# Patient Record
Sex: Female | Born: 1950 | Smoking: Former smoker
Health system: Southern US, Community
[De-identification: ages and names within clinical notes are randomized; demographics above are authoritative.]

## PROBLEM LIST (undated history)

## (undated) DIAGNOSIS — C44609 Unspecified malignant neoplasm of skin of left upper limb, including shoulder: Secondary | ICD-10-CM

## (undated) DIAGNOSIS — E785 Hyperlipidemia, unspecified: Secondary | ICD-10-CM

## (undated) DIAGNOSIS — C801 Malignant (primary) neoplasm, unspecified: Secondary | ICD-10-CM

## (undated) DIAGNOSIS — E119 Type 2 diabetes mellitus without complications: Secondary | ICD-10-CM

## (undated) DIAGNOSIS — I499 Cardiac arrhythmia, unspecified: Secondary | ICD-10-CM

## (undated) DIAGNOSIS — M7632 Iliotibial band syndrome, left leg: Secondary | ICD-10-CM

## (undated) HISTORY — DX: Iliotibial band syndrome, left leg: M76.32

## (undated) HISTORY — DX: Hyperlipidemia, unspecified: E78.5

## (undated) HISTORY — PX: TUBAL LIGATION: SHX77

## (undated) HISTORY — PX: CHOLECYSTECTOMY: SHX55

---
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
## 2006-06-18 ENCOUNTER — Ambulatory Visit: Payer: Self-pay | Admitting: Internal Medicine

## 2006-11-20 ENCOUNTER — Emergency Department: Payer: Self-pay | Admitting: Unknown Physician Specialty

## 2014-02-18 DIAGNOSIS — E782 Mixed hyperlipidemia: Secondary | ICD-10-CM

## 2014-02-18 HISTORY — DX: Mixed hyperlipidemia: E78.2

## 2015-09-09 DIAGNOSIS — M7632 Iliotibial band syndrome, left leg: Secondary | ICD-10-CM | POA: Insufficient documentation

## 2015-09-09 HISTORY — DX: Iliotibial band syndrome, left leg: M76.32

## 2015-12-19 ENCOUNTER — Encounter: Payer: Self-pay | Admitting: Cardiology

## 2017-10-29 DIAGNOSIS — E1169 Type 2 diabetes mellitus with other specified complication: Secondary | ICD-10-CM

## 2017-10-29 DIAGNOSIS — E785 Hyperlipidemia, unspecified: Secondary | ICD-10-CM

## 2017-10-29 HISTORY — DX: Type 2 diabetes mellitus with other specified complication: E11.69

## 2017-10-29 HISTORY — DX: Hyperlipidemia, unspecified: E78.5

## 2017-11-11 DIAGNOSIS — R079 Chest pain, unspecified: Secondary | ICD-10-CM | POA: Diagnosis not present

## 2017-11-12 DIAGNOSIS — R079 Chest pain, unspecified: Secondary | ICD-10-CM | POA: Diagnosis not present

## 2017-11-13 DIAGNOSIS — R079 Chest pain, unspecified: Secondary | ICD-10-CM | POA: Diagnosis not present

## 2017-11-20 ENCOUNTER — Ambulatory Visit (INDEPENDENT_AMBULATORY_CARE_PROVIDER_SITE_OTHER): Payer: Medicare Other | Admitting: Cardiology

## 2017-11-20 ENCOUNTER — Encounter: Payer: Self-pay | Admitting: Cardiology

## 2017-11-20 VITALS — BP 170/78 | HR 108 | Ht 66.0 in | Wt 158.0 lb

## 2017-11-20 DIAGNOSIS — Z87891 Personal history of nicotine dependence: Secondary | ICD-10-CM

## 2017-11-20 DIAGNOSIS — E1159 Type 2 diabetes mellitus with other circulatory complications: Secondary | ICD-10-CM | POA: Insufficient documentation

## 2017-11-20 DIAGNOSIS — E782 Mixed hyperlipidemia: Secondary | ICD-10-CM

## 2017-11-20 DIAGNOSIS — I152 Hypertension secondary to endocrine disorders: Secondary | ICD-10-CM

## 2017-11-20 DIAGNOSIS — I209 Angina pectoris, unspecified: Secondary | ICD-10-CM

## 2017-11-20 DIAGNOSIS — E785 Hyperlipidemia, unspecified: Secondary | ICD-10-CM

## 2017-11-20 DIAGNOSIS — I10 Essential (primary) hypertension: Secondary | ICD-10-CM

## 2017-11-20 HISTORY — DX: Hypertension secondary to endocrine disorders: I15.2

## 2017-11-20 HISTORY — DX: Personal history of nicotine dependence: Z87.891

## 2017-11-20 HISTORY — DX: Angina pectoris, unspecified: I20.9

## 2017-11-20 HISTORY — DX: Essential (primary) hypertension: I10

## 2017-11-20 HISTORY — DX: Hyperlipidemia, unspecified: E78.5

## 2017-11-20 HISTORY — DX: Type 2 diabetes mellitus with other circulatory complications: E11.59

## 2017-11-20 MED ORDER — NITROGLYCERIN 0.4 MG SL SUBL: 0.4000 mg | SUBLINGUAL_TABLET | SUBLINGUAL | 11 refills | Status: DC | PRN

## 2017-11-20 NOTE — Patient Instructions (Addendum)
Medication Instructions: Your physician has recommended you make the following change in your medication:  START Nitroglycerin 0.4 mg sublingual (under your tongue) as needed for chest pain. If experiencing chest pain, stop what you are doing and sit down. Take 1 nitroglycerin and wait 5 minutes. If chest pain continues, take another nitroglycerin and wait 5 minutes. If chest pain does not subside, take 1 more nitroglycerin and dial 911. You make take a total of 3 nitroglycerin in a 15 minute time frame  Labwork: None  Testing/Procedures: A chest x-ray takes a picture of the organs and structures inside the chest, including the heart, lungs, and blood vessels. This test can show several things, including, whether the heart is enlarges; whether fluid is building up in the lungs; and whether pacemaker / defibrillator leads are still in place.     [place] AT [facility] [place] [address] Dept: [phone] Loc: [place]  11/20/2017  You are scheduled for a Cardiac Catheterization on Friday, July 26 with Dr. Larae Grooms.  1. Please arrive at the [facility] (Main Entrance A) at [facility]: [address] [place], [place] [address] at 5:30 AM (This time is two hours before your procedure to ensure your preparation). Free valet parking service is available.   Special note: Every effort is made to have your procedure done on time. Please understand that emergencies sometimes delay scheduled procedures.  2. Diet: Do not eat solid foods after midnight.  The patient may have clear liquids until 5am upon the day of the procedure.  3. Labs:You do not need labs.  4. Medication instructions in preparation for your procedure:  Do not take your lisinopril-hctz the morning of the heart cath.  On the morning of your procedure, take your Aspirin and any morning medicines NOT listed  above.  You may use sips of water.  5. Plan for one night stay--bring personal belongings. 6. Bring a current list of your medications and current insurance cards. 7. You MUST have a responsible person to drive you home. 8. Someone MUST be with you the first 24 hours after you arrive home or your discharge will be delayed. 9. Please wear clothes that are easy to get on and off and wear slip-on shoes.  Thank you for allowing [place] to care for you!   -- [facility]   Follow-Up: Your physician recommends that you schedule a follow-up appointment in: 1 month  Any Other Special Instructions Will Be Listed Below (If Applicable).     If you need a refill on your cardiac medications before your next appointment, please call your pharmacy.   [place], RN, BSN

## 2017-11-20 NOTE — Progress Notes (Signed)
Cardiology Office Note:    Date:  11/20/2017   ID:  Courtney Gardner, DOB 1950-12-11, MRN [id number]  PCP:  Marguerita Merles, MD  Cardiologist:  Jenean Lindau, MD   Referring MD: No ref. provider found    ASSESSMENT:    1. Angina pectoris ([place])   2. Essential hypertension   3. Ex-smoker    PLAN:    In order of problems listed above:  1. Primary prevention stressed with the patient.  Importance of compliance with diet and medications stressed.  Her blood pressure is stable.  She has an element of whitecoat hypertension.  She mentions to me that her blood pressures a are fine at home.  She mentioned to me the numbers and they look fine. 2. Her symptoms are very concerning and she has multiple risk factors for coronary artery disease.I discussed coronary angiography and left heart catheterization with the patient at extensive length. Procedure, benefits and potential risks were explained. Patient had multiple questions which were answered to the patient's satisfaction. Patient agreed and consented for the procedure. Further recommendations will be made based on the findings of the coronary angiography. In the interim. The patient has any significant symptoms he knows to go to the nearest emergency room. 3. Sublingual nitroglycerin prescription was sent, its protocol and 911 protocol explained and the patient vocalized understanding questions were answered to the patient's satisfaction 4. [place] records were reviewed and questions were answered to her satisfaction.  Medication Adjustments/Labs and Tests Ordered: Current medicines are reviewed at length with the patient today.  Concerns regarding medicines are outlined above.  No orders of the defined types were placed in this encounter.  No orders of the defined types were placed in this encounter.    History of Present Illness:    Courtney Gardner is a 67 y.o. female who is being seen today for the evaluation of angina  pectoris at the request of her primary care provider.  Patient is a pleasant 66 year old female.  She has past medical history of essential hypertension and dyslipidemia.  She went on to  hospital because of chest tightness.  She was evaluated for this and stress test did not reveal any evidence of ischemia but there was some fixed defects.  Patient mentions to me that since she has come home she is been experiencing chest tightness at times not related to exertion.  No orthopnea or PND.  No radiation to any part of the body.  She is extremely concerned about this and is here for an evaluation.  Her friend accompanies her.  At the time of my evaluation, the patient is alert awake oriented and in no distress.  Past Medical History:  Diagnosis Date  . Hyperlipidemia 11/20/2017  . Iliotibial band syndrome of left side 09/09/2015    Past Surgical History:  Procedure Laterality Date  . CHOLECYSTECTOMY    . TUBAL LIGATION      Current Medications: Current Meds  Medication Sig  . aspirin EC 81 MG tablet Take 81 mg by mouth daily.  . cyclobenzaprine (FLEXERIL) 5 MG tablet Take 1 tablet by mouth as needed.  Marland Kitchen levothyroxine (SYNTHROID, LEVOTHROID) 50 MCG tablet Take 50 mcg by mouth daily.  Marland Kitchen lisinopril-hydrochlorothiazide (PRINZIDE,ZESTORETIC) 20-25 MG tablet Take 1 tablet by mouth daily.  Marland Kitchen lovastatin (MEVACOR) 20 MG tablet Take 20 mg by mouth at bedtime.  Marland Kitchen omeprazole (PRILOSEC) 20 MG capsule Take 1 capsule by mouth daily.     Allergies:  Metformin and related   Social History   Socioeconomic History  . Marital status: Unknown    Spouse name: Not on file  . Number of children: Not on file  . Years of education: Not on file  . Highest education level: Not on file  Occupational History  . Not on file  Social Needs  . Financial resource strain: Not on file  . Food insecurity:    Worry: Not on file    Inability: Not on file  . Transportation needs:    Medical: Not on file     Non-medical: Not on file  Tobacco Use  . Smoking status: Former Research scientist (life sciences)  . Smokeless tobacco: Never Used  Substance and Sexual Activity  . Alcohol use: Not on file  . Drug use: Not on file  . Sexual activity: Not on file  Lifestyle  . Physical activity:    Days per week: Not on file    Minutes per session: Not on file  . Stress: Not on file  Relationships  . Social connections:    Talks on phone: Not on file    Gets together: Not on file    Attends religious service: Not on file    Active member of club or organization: Not on file    Attends meetings of clubs or organizations: Not on file    Relationship status: Not on file  Other Topics Concern  . Not on file  Social History Narrative  . Not on file     Family History: The patient's family history includes Heart attack in her maternal grandfather, maternal grandmother, and maternal uncle; Hypertension in her sister; Leukemia in her mother; Lung cancer in her father.  ROS:   Please see the history of present illness.    All other systems reviewed and are negative.  EKGs/Labs/Other Studies Reviewed:    The following studies were reviewed today: I discussed my findings with the patient.  EKG reveals sinus rhythm and T wave inversions in lateral leads.   Recent Labs: No results found for requested labs within last 8760 hours.  Recent Lipid Panel No results found for: CHOL, TRIG, HDL, CHOLHDL, VLDL, LDLCALC, LDLDIRECT  Physical Exam:    VS:  BP (!) 170/78 (BP Location: Right Arm, Patient Position: Sitting, Cuff Size: Normal)   Pulse (!) 108   Ht 5\' 6"  (1.676 m)   Wt 158 lb (71.7 kg)   SpO2 97%   BMI 25.50 kg/m     Wt Readings from Last 3 Encounters:  11/20/17 158 lb (71.7 kg)     GEN: Patient is in no acute distress HEENT: Normal NECK: No JVD; No carotid bruits LYMPHATICS: No lymphadenopathy CARDIAC: S1 S2 regular, 2/6 systolic murmur at the apex. RESPIRATORY:  Clear to auscultation without rales, wheezing  or rhonchi  ABDOMEN: Soft, non-tender, non-distended MUSCULOSKELETAL:  No edema; No deformity  SKIN: Warm and dry NEUROLOGIC:  Alert and oriented x 3 PSYCHIATRIC:  Normal affect    Signed, Jenean Lindau, MD  11/20/2017 1:45 PM    [facility]

## 2017-11-20 NOTE — H&P (View-Only) (Signed)
Cardiology Office Note:    Date:  11/20/2017   ID:  Courtney Gardner, DOB Aug 17, 1950, MRN [id number]  PCP:  Marguerita Merles, MD  Cardiologist:  Jenean Lindau, MD   Referring MD: No ref. provider found    ASSESSMENT:    1. Angina pectoris ([place])   2. Essential hypertension   3. Ex-smoker    PLAN:    In order of problems listed above:  1. Primary prevention stressed with the patient.  Importance of compliance with diet and medications stressed.  Her blood pressure is stable.  She has an element of whitecoat hypertension.  She mentions to me that her blood pressures a are fine at home.  She mentioned to me the numbers and they look fine. 2. Her symptoms are very concerning and she has multiple risk factors for coronary artery disease.I discussed coronary angiography and left heart catheterization with the patient at extensive length. Procedure, benefits and potential risks were explained. Patient had multiple questions which were answered to the patient's satisfaction. Patient agreed and consented for the procedure. Further recommendations will be made based on the findings of the coronary angiography. In the interim. The patient has any significant symptoms he knows to go to the nearest emergency room. 3. Sublingual nitroglycerin prescription was sent, its protocol and 911 protocol explained and the patient vocalized understanding questions were answered to the patient's satisfaction 4. [place] records were reviewed and questions were answered to her satisfaction.  Medication Adjustments/Labs and Tests Ordered: Current medicines are reviewed at length with the patient today.  Concerns regarding medicines are outlined above.  No orders of the defined types were placed in this encounter.  No orders of the defined types were placed in this encounter.    History of Present Illness:    Courtney Gardner is a 67 y.o. female who is being seen today for the evaluation of angina  pectoris at the request of her primary care provider.  Patient is a pleasant 67 year old female.  She has past medical history of essential hypertension and dyslipidemia.  She went on to [place] hospital because of chest tightness.  She was evaluated for this and stress test did not reveal any evidence of ischemia but there was some fixed defects.  Patient mentions to me that since she has come home she is been experiencing chest tightness at times not related to exertion.  No orthopnea or PND.  No radiation to any part of the body.  She is extremely concerned about this and is here for an evaluation.  Her friend accompanies her.  At the time of my evaluation, the patient is alert awake oriented and in no distress.  Past Medical History:  Diagnosis Date  . Hyperlipidemia 11/20/2017  . Iliotibial band syndrome of left side 09/09/2015    Past Surgical History:  Procedure Laterality Date  . CHOLECYSTECTOMY    . TUBAL LIGATION      Current Medications: Current Meds  Medication Sig  . aspirin EC 81 MG tablet Take 81 mg by mouth daily.  . cyclobenzaprine (FLEXERIL) 5 MG tablet Take 1 tablet by mouth as needed.  Marland Kitchen levothyroxine (SYNTHROID, LEVOTHROID) 50 MCG tablet Take 50 mcg by mouth daily.  Marland Kitchen lisinopril-hydrochlorothiazide (PRINZIDE,ZESTORETIC) 20-25 MG tablet Take 1 tablet by mouth daily.  Marland Kitchen lovastatin (MEVACOR) 20 MG tablet Take 20 mg by mouth at bedtime.  Marland Kitchen omeprazole (PRILOSEC) 20 MG capsule Take 1 capsule by mouth daily.     Allergies:  Metformin and related   Social History   Socioeconomic History  . Marital status: Unknown    Spouse name: Not on file  . Number of children: Not on file  . Years of education: Not on file  . Highest education level: Not on file  Occupational History  . Not on file  Social Needs  . Financial resource strain: Not on file  . Food insecurity:    Worry: Not on file    Inability: Not on file  . Transportation needs:    Medical: Not on file     Non-medical: Not on file  Tobacco Use  . Smoking status: Former Research scientist (life sciences)  . Smokeless tobacco: Never Used  Substance and Sexual Activity  . Alcohol use: Not on file  . Drug use: Not on file  . Sexual activity: Not on file  Lifestyle  . Physical activity:    Days per week: Not on file    Minutes per session: Not on file  . Stress: Not on file  Relationships  . Social connections:    Talks on phone: Not on file    Gets together: Not on file    Attends religious service: Not on file    Active member of club or organization: Not on file    Attends meetings of clubs or organizations: Not on file    Relationship status: Not on file  Other Topics Concern  . Not on file  Social History Narrative  . Not on file     Family History: The patient's family history includes Heart attack in her maternal grandfather, maternal grandmother, and maternal uncle; Hypertension in her sister; Leukemia in her mother; Lung cancer in her father.  ROS:   Please see the history of present illness.    All other systems reviewed and are negative.  EKGs/Labs/Other Studies Reviewed:    The following studies were reviewed today: I discussed my findings with the patient.  EKG reveals sinus rhythm and T wave inversions in lateral leads.   Recent Labs: No results found for requested labs within last 8760 hours.  Recent Lipid Panel No results found for: CHOL, TRIG, HDL, CHOLHDL, VLDL, LDLCALC, LDLDIRECT  Physical Exam:    VS:  BP (!) 170/78 (BP Location: Right Arm, Patient Position: Sitting, Cuff Size: Normal)   Pulse (!) 108   Ht 5\' 6"  (1.676 m)   Wt 158 lb (71.7 kg)   SpO2 97%   BMI 25.50 kg/m     Wt Readings from Last 3 Encounters:  11/20/17 158 lb (71.7 kg)     GEN: Patient is in no acute distress HEENT: Normal NECK: No JVD; No carotid bruits LYMPHATICS: No lymphadenopathy CARDIAC: S1 S2 regular, 2/6 systolic murmur at the apex. RESPIRATORY:  Clear to auscultation without rales, wheezing  or rhonchi  ABDOMEN: Soft, non-tender, non-distended MUSCULOSKELETAL:  No edema; No deformity  SKIN: Warm and dry NEUROLOGIC:  Alert and oriented x 3 PSYCHIATRIC:  Normal affect    Signed, Jenean Lindau, MD  11/20/2017 1:45 PM    [facility]

## 2017-11-22 ENCOUNTER — Telehealth: Payer: Self-pay | Admitting: *Deleted

## 2017-11-22 NOTE — Telephone Encounter (Signed)
Pt contacted pre-catheterization scheduled at [facility] for: Friday November 23, 2017 7:30 AM Verified arrival time and place: [place] Entrance A at: 5:30 AM  No solid food after midnight prior to cath, clear liquids until 5 AM day of procedure. Verified allergies in Epic Verified no diabetes medications.  Hold: Lisinopril-HCTZ AM of procedure  Except hold medications AM meds can be  taken pre-cath with sip of water including: ASA 81 mg  Confirmed patient has responsible person to drive home post procedure and for 24 hours after you arrive home: yes

## 2017-11-23 ENCOUNTER — Ambulatory Visit (HOSPITAL_COMMUNITY)
Admission: RE | Admit: 2017-11-23 | Discharge: 2017-11-23 | Disposition: A | Discharge status: Home / Self Care | Payer: Medicare Other | Source: Ambulatory Visit | Attending: Interventional Cardiology | Admitting: Interventional Cardiology

## 2017-11-23 ENCOUNTER — Other Ambulatory Visit: Payer: Self-pay

## 2017-11-23 ENCOUNTER — Encounter (HOSPITAL_COMMUNITY)
Admission: RE | Disposition: A | Discharge status: Home / Self Care | Payer: Self-pay | Source: Ambulatory Visit | Attending: Interventional Cardiology

## 2017-11-23 DIAGNOSIS — Z888 Allergy status to other drugs, medicaments and biological substances status: Secondary | ICD-10-CM | POA: Insufficient documentation

## 2017-11-23 DIAGNOSIS — Z9889 Other specified postprocedural states: Secondary | ICD-10-CM | POA: Insufficient documentation

## 2017-11-23 DIAGNOSIS — E782 Mixed hyperlipidemia: Secondary | ICD-10-CM

## 2017-11-23 DIAGNOSIS — Z8249 Family history of ischemic heart disease and other diseases of the circulatory system: Secondary | ICD-10-CM | POA: Insufficient documentation

## 2017-11-23 DIAGNOSIS — I209 Angina pectoris, unspecified: Secondary | ICD-10-CM

## 2017-11-23 DIAGNOSIS — E785 Hyperlipidemia, unspecified: Secondary | ICD-10-CM | POA: Insufficient documentation

## 2017-11-23 DIAGNOSIS — Z9049 Acquired absence of other specified parts of digestive tract: Secondary | ICD-10-CM | POA: Diagnosis not present

## 2017-11-23 DIAGNOSIS — Z7982 Long term (current) use of aspirin: Secondary | ICD-10-CM | POA: Insufficient documentation

## 2017-11-23 DIAGNOSIS — Z79899 Other long term (current) drug therapy: Secondary | ICD-10-CM | POA: Diagnosis not present

## 2017-11-23 DIAGNOSIS — I25119 Atherosclerotic heart disease of native coronary artery with unspecified angina pectoris: Secondary | ICD-10-CM | POA: Diagnosis not present

## 2017-11-23 DIAGNOSIS — Z9851 Tubal ligation status: Secondary | ICD-10-CM | POA: Diagnosis not present

## 2017-11-23 DIAGNOSIS — Z87891 Personal history of nicotine dependence: Secondary | ICD-10-CM | POA: Diagnosis not present

## 2017-11-23 DIAGNOSIS — Z79890 Hormone replacement therapy (postmenopausal): Secondary | ICD-10-CM | POA: Insufficient documentation

## 2017-11-23 DIAGNOSIS — I10 Essential (primary) hypertension: Secondary | ICD-10-CM | POA: Diagnosis not present

## 2017-11-23 HISTORY — PX: INTRAVASCULAR PRESSURE WIRE/FFR STUDY: CATH118243

## 2017-11-23 HISTORY — PX: LEFT HEART CATH AND CORONARY ANGIOGRAPHY: CATH118249

## 2017-11-23 LAB — BASIC METABOLIC PANEL
ANION GAP: 17 — AB (ref 5–15)
BUN: 13 mg/dL (ref 8–23)
CHLORIDE: 79 mmol/L — AB (ref 98–111)
CO2: 22 mmol/L (ref 22–32)
Calcium: 10.4 mg/dL — ABNORMAL HIGH (ref 8.9–10.3)
Creatinine, Ser: 1.2 mg/dL — ABNORMAL HIGH (ref 0.44–1.00)
GFR calc Af Amer: 53 mL/min — ABNORMAL LOW (ref >60)
GFR, EST NON AFRICAN AMERICAN: 45 mL/min — AB (ref >60)
GLUCOSE: 235 mg/dL — AB (ref 70–99)
POTASSIUM: 4.1 mmol/L (ref 3.5–5.1)
Sodium: 118 mmol/L — CL (ref 135–145)

## 2017-11-23 LAB — CBC
HCT: 41.3 % (ref 36.0–46.0)
HEMOGLOBIN: 15.2 g/dL — AB (ref 12.0–15.0)
MCH: 30.5 pg (ref 26.0–34.0)
MCHC: 36.8 g/dL — ABNORMAL HIGH (ref 30.0–36.0)
MCV: 82.9 fL (ref 78.0–100.0)
Platelets: 317 10*3/uL (ref 150–400)
RBC: 4.98 MIL/uL (ref 3.87–5.11)
RDW: 11.2 % — ABNORMAL LOW (ref 11.5–15.5)
WBC: 12.1 10*3/uL — AB (ref 4.0–10.5)

## 2017-11-23 LAB — POCT ACTIVATED CLOTTING TIME: Activated Clotting Time: 296 seconds

## 2017-11-23 SURGERY — LEFT HEART CATH AND CORONARY ANGIOGRAPHY
Anesthesia: LOCAL

## 2017-11-23 MED ORDER — VERAPAMIL HCL 2.5 MG/ML IV SOLN
INTRAVENOUS | Status: DC | PRN
  Administered 2017-11-23: 10 mL via INTRA_ARTERIAL

## 2017-11-23 MED ORDER — SODIUM CHLORIDE 0.9 % WEIGHT BASED INFUSION: 1.0000 mL/kg/h | INTRAVENOUS | Status: DC

## 2017-11-23 MED ORDER — MIDAZOLAM HCL 2 MG/2ML IJ SOLN
INTRAMUSCULAR | Status: DC | PRN
  Administered 2017-11-23: 1 mg via INTRAVENOUS

## 2017-11-23 MED ORDER — NITROGLYCERIN 1 MG/10 ML FOR IR/CATH LAB
INTRA_ARTERIAL | Status: AC
  Filled 2017-11-23: qty 10

## 2017-11-23 MED ORDER — FENTANYL CITRATE (PF) 100 MCG/2ML IJ SOLN
INTRAMUSCULAR | Status: DC | PRN
  Administered 2017-11-23: 25 ug via INTRAVENOUS

## 2017-11-23 MED ORDER — ADENOSINE 12 MG/4ML IV SOLN
INTRAVENOUS | Status: AC
  Filled 2017-11-23: qty 16

## 2017-11-23 MED ORDER — HEPARIN SODIUM (PORCINE) 1000 UNIT/ML IJ SOLN
INTRAMUSCULAR | Status: AC
  Filled 2017-11-23: qty 1

## 2017-11-23 MED ORDER — NITROGLYCERIN 1 MG/10 ML FOR IR/CATH LAB
INTRA_ARTERIAL | Status: DC | PRN
  Administered 2017-11-23: 200 ug via INTRACORONARY
  Administered 2017-11-23: 200 ug via INTRA_ARTERIAL

## 2017-11-23 MED ORDER — IOPAMIDOL (ISOVUE-370) INJECTION 76%
INTRAVENOUS | Status: AC
  Filled 2017-11-23: qty 100

## 2017-11-23 MED ORDER — HEPARIN SODIUM (PORCINE) 1000 UNIT/ML IJ SOLN
INTRAMUSCULAR | Status: DC | PRN
  Administered 2017-11-23: 4000 [IU] via INTRAVENOUS
  Administered 2017-11-23: 3500 [IU] via INTRAVENOUS

## 2017-11-23 MED ORDER — SODIUM CHLORIDE 0.9 % WEIGHT BASED INFUSION
3.0000 mL/kg/h | INTRAVENOUS | Status: DC
  Administered 2017-11-23: 3 mL/kg/h via INTRAVENOUS

## 2017-11-23 MED ORDER — ASPIRIN 81 MG PO CHEW: 81.0000 mg | CHEWABLE_TABLET | ORAL | Status: DC

## 2017-11-23 MED ORDER — FENTANYL CITRATE (PF) 100 MCG/2ML IJ SOLN
INTRAMUSCULAR | Status: AC
  Filled 2017-11-23: qty 2

## 2017-11-23 MED ORDER — SODIUM CHLORIDE 0.9 % IV SOLN: INTRAVENOUS | Status: AC

## 2017-11-23 MED ORDER — SODIUM CHLORIDE 0.9% FLUSH: 3.0000 mL | Freq: Two times a day (BID) | INTRAVENOUS | Status: DC

## 2017-11-23 MED ORDER — SODIUM CHLORIDE 0.9 % IV SOLN: 250.0000 mL | INTRAVENOUS | Status: DC | PRN

## 2017-11-23 MED ORDER — LIDOCAINE HCL (PF) 1 % IJ SOLN
INTRAMUSCULAR | Status: DC | PRN
  Administered 2017-11-23: 2 mL via INTRADERMAL

## 2017-11-23 MED ORDER — HYDRALAZINE HCL 20 MG/ML IJ SOLN
10.0000 mg | Freq: Once | INTRAMUSCULAR | Status: AC
  Administered 2017-11-23: 10 mg via INTRAVENOUS
  Filled 2017-11-23: qty 1

## 2017-11-23 MED ORDER — ADENOSINE (DIAGNOSTIC) 140MCG/KG/MIN
INTRAVENOUS | Status: DC | PRN
  Administered 2017-11-23: 140 ug/kg/min via INTRAVENOUS

## 2017-11-23 MED ORDER — SODIUM CHLORIDE 0.9% FLUSH: 3.0000 mL | INTRAVENOUS | Status: DC | PRN

## 2017-11-23 MED ORDER — VERAPAMIL HCL 2.5 MG/ML IV SOLN
INTRAVENOUS | Status: AC
  Filled 2017-11-23: qty 2

## 2017-11-23 MED ORDER — IOPAMIDOL (ISOVUE-370) INJECTION 76%
INTRAVENOUS | Status: AC
  Filled 2017-11-23: qty 50

## 2017-11-23 MED ORDER — ONDANSETRON HCL 4 MG/2ML IJ SOLN
4.0000 mg | Freq: Once | INTRAMUSCULAR | Status: AC
  Administered 2017-11-23: 4 mg via INTRAVENOUS
  Filled 2017-11-23: qty 2

## 2017-11-23 MED ORDER — HEPARIN (PORCINE) IN NACL 1000-0.9 UT/500ML-% IV SOLN
INTRAVENOUS | Status: DC | PRN
  Administered 2017-11-23: 500 mL

## 2017-11-23 MED ORDER — ACETAMINOPHEN 325 MG PO TABS: 650.0000 mg | ORAL_TABLET | ORAL | Status: DC | PRN

## 2017-11-23 MED ORDER — HEPARIN (PORCINE) IN NACL 1000-0.9 UT/500ML-% IV SOLN
INTRAVENOUS | Status: AC
  Filled 2017-11-23: qty 1000

## 2017-11-23 MED ORDER — SODIUM CHLORIDE 0.9 % IV SOLN
250.0000 mL | INTRAVENOUS | Status: DC | PRN
  Administered 2017-11-23: 250 mL via INTRAVENOUS

## 2017-11-23 MED ORDER — LORAZEPAM 0.5 MG PO TABS
0.5000 mg | ORAL_TABLET | Freq: Once | ORAL | Status: AC
  Administered 2017-11-23: 0.5 mg via ORAL
  Filled 2017-11-23: qty 1

## 2017-11-23 MED ORDER — LIDOCAINE HCL (PF) 1 % IJ SOLN
INTRAMUSCULAR | Status: AC
  Filled 2017-11-23: qty 30

## 2017-11-23 MED ORDER — ONDANSETRON HCL 4 MG/2ML IJ SOLN: 4.0000 mg | Freq: Four times a day (QID) | INTRAMUSCULAR | Status: DC | PRN

## 2017-11-23 MED ORDER — MIDAZOLAM HCL 2 MG/2ML IJ SOLN
INTRAMUSCULAR | Status: AC
  Filled 2017-11-23: qty 2

## 2017-11-23 SURGICAL SUPPLY — 13 items
CATH 5FR JL3.5 JR4 ANG PIG MP (CATHETERS) ×1 IMPLANT
CATH LAUNCHER 5F EBU3.0 (CATHETERS) IMPLANT
CATHETER LAUNCHER 5F EBU3.0 (CATHETERS) ×2
DEVICE RAD COMP TR BAND LRG (VASCULAR PRODUCTS) ×1 IMPLANT
GLIDESHEATH SLEND SS 6F .021 (SHEATH) ×1 IMPLANT
GUIDEWIRE INQWIRE 1.5J.035X260 (WIRE) IMPLANT
GUIDEWIRE PRESSURE COMET II (WIRE) ×1 IMPLANT
INQWIRE 1.5J .035X260CM (WIRE) ×2
KIT HEART LEFT (KITS) ×2 IMPLANT
KIT HEMO VALVE WATCHDOG (MISCELLANEOUS) ×1 IMPLANT
PACK CARDIAC CATHETERIZATION (CUSTOM PROCEDURE TRAY) ×2 IMPLANT
TRANSDUCER W/STOPCOCK (MISCELLANEOUS) ×2 IMPLANT
TUBING CIL FLEX 10 FLL-RA (TUBING) ×2 IMPLANT

## 2017-11-23 NOTE — Discharge Instructions (Signed)
Drink plenty of fluids over next 48 hours and keep right wrist elevated at heart level for 24 hours ° °Radial Site Care °Refer to this sheet in the next few weeks. These instructions provide you with information about caring for yourself after your procedure. Your health care provider may also give you more specific instructions. Your treatment has been planned according to current medical practices, but problems sometimes occur. Call your health care provider if you have any problems or questions after your procedure. °What can I expect after the procedure? °After your procedure, it is typical to have the following: °· Bruising at the radial site that usually fades within 1-2 weeks. °· Blood collecting in the tissue (hematoma) that may be painful to the touch. It should usually decrease in size and tenderness within 1-2 weeks. ° °Follow these instructions at home: °· Take medicines only as directed by your health care provider. °· You may shower 24-48 hours after the procedure or as directed by your health care provider. Remove the bandage (dressing) and gently wash the site with plain soap and water. Pat the area dry with a clean towel. Do not rub the site, because this may cause bleeding. °· Do not take baths, swim, or use a hot tub until your health care provider approves. °· Check your insertion site every day for redness, swelling, or drainage. °· Do not apply powder or lotion to the site. °· Do not flex or bend the affected arm for 24 hours or as directed by your health care provider. °· Do not push or pull heavy objects with the affected arm for 24 hours or as directed by your health care provider. °· Do not lift over 10 lb (4.5 kg) for 5 days after your procedure or as directed by your health care provider. °· Ask your health care provider when it is okay to: °? Return to work or school. °? Resume usual physical activities or sports. °? Resume sexual activity. °· Do not drive home if you are discharged the  same day as the procedure. Have someone else drive you. °· You may drive 24 hours after the procedure unless otherwise instructed by your health care provider. °· Do not operate machinery or power tools for 24 hours after the procedure. °· If your procedure was done as an outpatient procedure, which means that you went home the same day as your procedure, a responsible adult should be with you for the first 24 hours after you arrive home. °· Keep all follow-up visits as directed by your health care provider. This is important. °Contact a health care provider if: °· You have a fever. °· You have chills. °· You have increased bleeding from the radial site. Hold pressure on the site. °Get help right away if: °· You have unusual pain at the radial site. °· You have redness, warmth, or swelling at the radial site. °· You have drainage (other than a small amount of blood on the dressing) from the radial site. °· The radial site is bleeding, and the bleeding does not stop after 30 minutes of holding steady pressure on the site. °· Your arm or hand becomes pale, cool, tingly, or numb. °This information is not intended to replace advice given to you by your health care provider. Make sure you discuss any questions you have with your health care provider. °Document Released: 05/20/2010 Document Revised: 09/23/2015 Document Reviewed: 11/03/2013 °Elsevier Interactive Patient Education © 2018 [employer] ° °

## 2017-11-23 NOTE — Progress Notes (Signed)
Labs called to [facility]

## 2017-11-23 NOTE — Interval H&P Note (Signed)
Cath Lab Visit (complete for each Cath Lab visit)  Clinical Evaluation Leading to the Procedure:   ACS: No.  Non-ACS:    Anginal Classification: CCS III  Anti-ischemic medical therapy: Minimal Therapy (1 class of medications)  Non-Invasive Test Results: No non-invasive testing performed  Prior CABG: No previous CABG      History and Physical Interval Note:  11/23/2017 8:41 AM  Weston Brass Kirker  has presented today for surgery, with the diagnosis of CP  The various methods of treatment have been discussed with the patient and family. After consideration of risks, benefits and other options for treatment, the patient has consented to  Procedure(s): LEFT HEART CATH AND CORONARY ANGIOGRAPHY (N/A) as a surgical intervention .  The patient's history has been reviewed, patient examined, no change in status, stable for surgery.  I have reviewed the patient's chart and labs.  Questions were answered to the patient's satisfaction.     Larae Grooms

## 2017-11-23 NOTE — Progress Notes (Signed)
Pt c/o severe nausea and looks to be uncomfortabl.  She states she has been very nauseated for 2 days. She vomitted during admitting VS and c/o 10/10 center back neck pain.  Pt states she is" way to sick to go home and wants to be admitted.  Courtney Gardner notified.  Pt on hold.

## 2017-11-25 ENCOUNTER — Encounter (HOSPITAL_COMMUNITY): Payer: Self-pay | Admitting: Interventional Cardiology

## 2017-11-26 MED FILL — Heparin Sod (Porcine)-NaCl IV Soln 1000 Unit/500ML-0.9%: INTRAVENOUS | Qty: 500 | Status: AC

## 2017-12-24 ENCOUNTER — Ambulatory Visit (INDEPENDENT_AMBULATORY_CARE_PROVIDER_SITE_OTHER): Payer: Medicare Other

## 2017-12-24 DIAGNOSIS — I209 Angina pectoris, unspecified: Secondary | ICD-10-CM

## 2017-12-24 DIAGNOSIS — I10 Essential (primary) hypertension: Secondary | ICD-10-CM | POA: Diagnosis not present

## 2017-12-24 NOTE — Progress Notes (Signed)
Complete renal artery duplex has been performed.    Courtney Gardner

## 2017-12-26 ENCOUNTER — Encounter: Payer: Self-pay | Admitting: Cardiology

## 2017-12-26 ENCOUNTER — Ambulatory Visit (INDEPENDENT_AMBULATORY_CARE_PROVIDER_SITE_OTHER): Payer: Medicare Other | Admitting: Cardiology

## 2017-12-26 VITALS — BP 182/84 | HR 63 | Ht 66.0 in | Wt 163.0 lb

## 2017-12-26 DIAGNOSIS — I2510 Atherosclerotic heart disease of native coronary artery without angina pectoris: Secondary | ICD-10-CM

## 2017-12-26 DIAGNOSIS — E782 Mixed hyperlipidemia: Secondary | ICD-10-CM | POA: Diagnosis not present

## 2017-12-26 DIAGNOSIS — I10 Essential (primary) hypertension: Secondary | ICD-10-CM

## 2017-12-26 HISTORY — DX: Atherosclerotic heart disease of native coronary artery without angina pectoris: I25.10

## 2017-12-26 NOTE — Progress Notes (Signed)
Cardiology Office Note:    Date:  12/26/2017   ID:  Courtney Gardner, DOB 1951-02-14, MRN [id number]  PCP:  Courtney Merles, MD  Cardiologist:  Courtney Lindau, MD   Referring MD: Courtney Merles, MD    ASSESSMENT:    1. Coronary artery disease involving native coronary artery of native heart, angina presence unspecified   2. Essential hypertension   3. Mixed hyperlipidemia    PLAN:    In order of problems listed above:  1. Secondary prevention stressed with the patient.  Importance of compliance with diet and medication stressed and she vocalized understanding.  Her blood pressure is stable. 2. I would like to put her on a more potent statin.  She will be back in the next few days for liver lipid check and also a TSH and a Chem-7.  I will change her medications according to the results of these tests. 3. Patient will be seen in follow-up appointment in 6 months or earlier if the patient has any concerns    Medication Adjustments/Labs and Tests Ordered: Current medicines are reviewed at length with the patient today.  Concerns regarding medicines are outlined above.  Orders Placed This Encounter  Procedures  . Basic metabolic panel  . TSH  . Hepatic function panel  . Lipid panel   No orders of the defined types were placed in this encounter.    No chief complaint on file.    History of Present Illness:    Courtney Gardner is a 67 y.o. female.  Patient has history of nonobstructive coronary artery disease.  This was recently diagnosed by coronary angiography.  She denies any problems at this time and takes care of activities of daily living.  No chest pain orthopnea or PND.  She is now walking regularly.  Her blood pressure is stable.  Past Medical History:  Diagnosis Date  . Hyperlipidemia 11/20/2017  . Iliotibial band syndrome of left side 09/09/2015    Past Surgical History:  Procedure Laterality Date  . CHOLECYSTECTOMY    . INTRAVASCULAR PRESSURE WIRE/FFR STUDY N/A  11/23/2017   Procedure: INTRAVASCULAR PRESSURE WIRE/FFR STUDY;  Surgeon: Courtney Booze, MD;  Location: [facility] [facility];  Service: Cardiovascular;  Laterality: N/A;  . LEFT HEART CATH AND CORONARY ANGIOGRAPHY N/A 11/23/2017   Procedure: LEFT HEART CATH AND CORONARY ANGIOGRAPHY;  Surgeon: Courtney Booze, MD;  Location: [place] [facility];  Service: Cardiovascular;  Laterality: N/A;  . TUBAL LIGATION      Current Medications: Current Meds  Medication Sig  . aspirin EC 81 MG tablet Take 81 mg by mouth daily.  . cyclobenzaprine (FLEXERIL) 5 MG tablet Take 1 tablet by mouth daily as needed for muscle spasms.   Marland Kitchen levothyroxine (SYNTHROID, LEVOTHROID) 50 MCG tablet Take 50 mcg by mouth daily before breakfast.   . lisinopril (PRINIVIL,ZESTRIL) 5 MG tablet Take 1 tablet by mouth daily.  Marland Kitchen lovastatin (MEVACOR) 20 MG tablet Take 20 mg by mouth at bedtime.  . nitroGLYCERIN (NITROSTAT) 0.4 MG SL tablet Place 1 tablet (0.4 mg total) under the tongue every 5 (five) minutes as needed.  Marland Kitchen omeprazole (PRILOSEC) 20 MG capsule Take 20 mg by mouth daily.      Allergies:   Metformin and related   Social History   Socioeconomic History  . Marital status: Unknown    Spouse name: Not on file  . Number of children: Not on file  . Years of education: Not on file  .  Highest education level: Not on file  Occupational History  . Not on file  Social Needs  . Financial resource strain: Not on file  . Food insecurity:    Worry: Not on file    Inability: Not on file  . Transportation needs:    Medical: Not on file    Non-medical: Not on file  Tobacco Use  . Smoking status: Former Research scientist (life sciences)  . Smokeless tobacco: Never Used  Substance and Sexual Activity  . Alcohol use: Not on file  . Drug use: Not on file  . Sexual activity: Not on file  Lifestyle  . Physical activity:    Days per week: Not on file    Minutes per session: Not on file  . Stress: Not on file  Relationships  . Social  connections:    Talks on phone: Not on file    Gets together: Not on file    Attends religious service: Not on file    Active member of club or organization: Not on file    Attends meetings of clubs or organizations: Not on file    Relationship status: Not on file  Other Topics Concern  . Not on file  Social History Narrative  . Not on file     Family History: The patient's family history includes Heart attack in her maternal grandfather, maternal grandmother, and maternal uncle; Hypertension in her sister; Leukemia in her mother; Lung cancer in her father.  ROS:   Please see the history of present illness.    All other systems reviewed and are negative.  EKGs/Labs/Other Studies Reviewed:    The following studies were reviewed today: I discussed the findings of the coronary angiography with the patient at length.   Recent Labs: 11/23/2017: BUN 13; Creatinine, Ser 1.20; Hemoglobin 15.2; Platelets 317; Potassium 4.1; Sodium 118  Recent Lipid Panel No results found for: CHOL, TRIG, HDL, CHOLHDL, VLDL, LDLCALC, LDLDIRECT  Physical Exam:    VS:  BP (!) 182/84 (BP Location: Right Arm, Patient Position: Sitting, Cuff Size: Normal)   Pulse 63   Ht 5\' 6"  (1.676 m)   Wt 163 lb (73.9 kg)   SpO2 97%   BMI 26.31 kg/m     Wt Readings from Last 3 Encounters:  12/26/17 163 lb (73.9 kg)  11/23/17 158 lb (71.7 kg)  11/20/17 158 lb (71.7 kg)     GEN: Patient is in no acute distress HEENT: Normal NECK: No JVD; No carotid bruits LYMPHATICS: No lymphadenopathy CARDIAC: Hear sounds regular, 2/6 systolic murmur at the apex. RESPIRATORY:  Clear to auscultation without rales, wheezing or rhonchi  ABDOMEN: Soft, non-tender, non-distended MUSCULOSKELETAL:  No edema; No deformity  SKIN: Warm and dry NEUROLOGIC:  Alert and oriented x 3 PSYCHIATRIC:  Normal affect   Signed, Courtney Lindau, MD  12/26/2017 3:37 PM    [facility]

## 2017-12-26 NOTE — Patient Instructions (Signed)
Medication Instructions:  Your physician recommends that you continue on your current medications as directed. Please refer to the Current Medication list given to you today.  Labwork: Your physician recommends that you have the following labs drawn: Please return to the office fasting (only water for 9 hours before) for BMP, TSH, liver and lipid panel.  Testing/Procedures: None  Follow-Up: Your physician recommends that you schedule a follow-up appointment in: 6 months  Any Other Special Instructions Will Be Listed Below (If Applicable).     If you need a refill on your cardiac medications before your next appointment, please call your pharmacy.   [place], RN, BSN

## 2019-04-09 ENCOUNTER — Telehealth: Payer: Self-pay | Admitting: Cardiology

## 2019-04-09 NOTE — Telephone Encounter (Signed)
YOUR CARDIOLOGY TEAM HAS ARRANGED FOR AN E-VISIT FOR YOUR APPOINTMENT - PLEASE REVIEW IMPORTANT INFORMATION BELOW SEVERAL DAYS PRIOR TO YOUR APPOINTMENT  Due to the recent COVID-19 pandemic, we are transitioning in-person office visits to tele-medicine visits in an effort to decrease unnecessary exposure to our patients, their families, and staff. These visits are billed to your insurance just like a normal visit is. We also encourage you to sign up for MyChart if you have not already done so. You will need a smartphone if possible. For patients that do not have this, we can still complete the visit using a regular telephone but do prefer a smartphone to enable video when possible. You may have a family member that lives with you that can help. If possible, we also ask that you have a blood pressure cuff and scale at home to measure your blood pressure, heart rate and weight prior to your scheduled appointment. Patients with clinical needs that need an in-person evaluation and testing will still be able to come to the office if absolutely necessary. If you have any questions, feel free to call our office.     YOUR PROVIDER WILL BE USING THE FOLLOWING PLATFORM TO COMPLETE YOUR VISIT:  DOXY   IF USING MYCHART - How to Download the MyChart App to Your SmartPhone   - If Apple, go to [employer] and type in MyChart in the search bar and download the app. If Android, ask patient to go to [employer] and type in [place] in the search bar and download the app. The app is free but as with any other app downloads, your phone may require you to verify saved payment information or Apple/Android password.  - You will need to then log into the app with your MyChart username and password, and select [facility] as your healthcare provider to link the account.  - When it is time for your visit, go to the MyChart app, find appointments, and click Begin Video Visit. Be sure to Select Allow for your device to access  the Microphone and Camera for your visit. You will then be connected, and your provider will be with you shortly.  **If you have any issues connecting or need assistance, please contact MyChart service desk [phone])**  **If using a computer, in order to ensure the best quality for your visit, you will need to use either of the following Internet Browsers: Insurance underwriter or [employer] Edge DOXY   IF USING DOXIMITY or [email or web address] - The staff will give you instructions on receiving your link to join the meeting the day of your visit.      2-3 DAYS BEFORE YOUR APPOINTMENT  You will receive a telephone call from one of our [facility] team members - your caller ID may say "Unknown caller." If this is a video visit, we will walk you through how to get the video launched on your phone. We will remind you check your blood pressure, heart rate and weight prior to your scheduled appointment. If you have an Apple Watch or Kardia, please upload any pertinent ECG strips the day before or morning of your appointment to [place]. Our staff will also make sure you have reviewed the consent and agree to move forward with your scheduled tele-health visit.     THE DAY OF YOUR APPOINTMENT  Approximately 15 minutes prior to your scheduled appointment, you will receive a telephone call from one of [facility] team - your caller ID may say "Unknown  caller."  Our staff will confirm medications, vital signs for the day and any symptoms you may be experiencing. Please have this information available prior to the time of visit start. It may also be helpful for you to have a pad of paper and pen handy for any instructions given during your visit. They will also walk you through joining the smartphone meeting if this is a video visit.    CONSENT FOR TELE-HEALTH VISIT - PLEASE REVIEW  I hereby voluntarily request, consent and authorize [facility] and its employed or contracted physicians, physician  assistants, nurse practitioners or other licensed health care professionals (the Practitioner), to provide me with telemedicine health care services (the Services") as deemed necessary by the treating Practitioner. I acknowledge and consent to receive the Services by the Practitioner via telemedicine. I understand that the telemedicine visit will involve communicating with the Practitioner through live audiovisual communication technology and the disclosure of certain medical information by electronic transmission. I acknowledge that I have been given the opportunity to request an in-person assessment or other available alternative prior to the telemedicine visit and am voluntarily participating in the telemedicine visit.  I understand that I have the right to withhold or withdraw my consent to the use of telemedicine in the course of my care at any time, without affecting my right to future care or treatment, and that the Practitioner or I may terminate the telemedicine visit at any time. I understand that I have the right to inspect all information obtained and/or recorded in the course of the telemedicine visit and may receive copies of available information for a reasonable fee.  I understand that some of the potential risks of receiving the Services via telemedicine include:   Delay or interruption in medical evaluation due to technological equipment failure or disruption;  Information transmitted may not be sufficient (e.g. poor resolution of images) to allow for appropriate medical decision making by the Practitioner; and/or   In rare instances, security protocols could fail, causing a breach of personal health information.  Furthermore, I acknowledge that it is my responsibility to provide information about my medical history, conditions and care that is complete and accurate to the best of my ability. I acknowledge that Practitioner's advice, recommendations, and/or decision may be based on  factors not within their control, such as incomplete or inaccurate data provided by me or distortions of diagnostic images or specimens that may result from electronic transmissions. I understand that the practice of medicine is not an exact science and that Practitioner makes no warranties or guarantees regarding treatment outcomes. I acknowledge that I will receive a copy of this consent concurrently upon execution via email to the email address I last provided but may also request a printed copy by calling the office of [facility].    I understand that my insurance will be billed for this visit.   I have read or had this consent read to me.  I understand the contents of this consent, which adequately explains the benefits and risks of the Services being provided via telemedicine.   I have been provided ample opportunity to ask questions regarding this consent and the Services and have had my questions answered to my satisfaction.  I give my informed consent for the services to be provided through the use of telemedicine in my medical care  By participating in this telemedicine visit I agree to the above.

## 2019-04-11 ENCOUNTER — Encounter: Payer: Self-pay | Admitting: Cardiology

## 2019-04-11 ENCOUNTER — Telehealth (INDEPENDENT_AMBULATORY_CARE_PROVIDER_SITE_OTHER): Payer: Medicare Other | Admitting: Cardiology

## 2019-04-11 ENCOUNTER — Other Ambulatory Visit: Payer: Self-pay

## 2019-04-11 DIAGNOSIS — I10 Essential (primary) hypertension: Secondary | ICD-10-CM | POA: Diagnosis not present

## 2019-04-11 DIAGNOSIS — I2510 Atherosclerotic heart disease of native coronary artery without angina pectoris: Secondary | ICD-10-CM

## 2019-04-11 DIAGNOSIS — E782 Mixed hyperlipidemia: Secondary | ICD-10-CM | POA: Diagnosis not present

## 2019-04-11 DIAGNOSIS — Z87891 Personal history of nicotine dependence: Secondary | ICD-10-CM | POA: Diagnosis not present

## 2019-04-11 NOTE — Patient Instructions (Signed)
Medication Instructions:  Your physician recommends that you continue on your current medications as directed. Please refer to the Current Medication list given to you today.  *If you need a refill on your cardiac medications before your next appointment, please call your pharmacy*  Lab Work: None.  If you have labs (blood work) drawn today and your tests are completely normal, you will receive your results only by: Marland Kitchen MyChart Message (if you have MyChart) OR . A paper copy in the mail If you have any lab test that is abnormal or we need to change your treatment, we will call you to review the results.  Testing/Procedures: None.   Follow-Up: At [facility], you and your health needs are our priority.  As part of our continuing mission to provide you with exceptional heart care, we have created designated Provider Care Teams.  These Care Teams include your primary Cardiologist (physician) and Advanced Practice Providers (APPs -  Physician Assistants and Nurse Practitioners) who all work together to provide you with the care you need, when you need it.  Your next appointment:   3 month(s)  The format for your next appointment:   In Person  Provider:   Jyl Heinz, MD  Other Instructions

## 2019-04-11 NOTE — Progress Notes (Signed)
Virtual Visit via Telephone Note   This visit type was conducted due to national recommendations for restrictions regarding the COVID-19 Pandemic (e.g. social distancing) in an effort to limit this patient's exposure and mitigate transmission in our community.  Due to her co-morbid illnesses, this patient is at least at moderate risk for complications without adequate follow up.  This format is felt to be most appropriate for this patient at this time.  The patient did not have access to video technology/had technical difficulties with video requiring transitioning to audio format only (telephone).  All issues noted in this document were discussed and addressed.  No physical exam could be performed with this format.  Please refer to the patient's chart for her  consent to telehealth for [facility].   Date:  04/11/2019   ID:  Courtney Gardner, DOB 1950/12/18, MRN [id number]  Patient Location: Home Provider Location: Office  PCP:  Marguerita Merles, MD  Cardiologist:  No primary care provider on file.  Electrophysiologist:  None   Evaluation Performed:  Follow-Up Visit  Chief Complaint: Coronary artery disease follow-up  History of Present Illness:    Courtney Gardner is a 68 y.o. female with.  Patient has past medical history of nonobstructive coronary artery disease by coronary angiography.  Details are mentioned below.  She denies any problems from a cardiovascular standpoint.  No chest pain orthopnea or PND.  She is dealing with a kidney stone and recently went to the emergency room and was treated and released.  She is following up with her primary care physician for these issues.  At the time of my evaluation, the patient is alert awake oriented and in no distress.  The patient does not have symptoms concerning for COVID-19 infection (fever, chills, cough, or new shortness of breath).    Past Medical History:  Diagnosis Date  . Hyperlipidemia 11/20/2017  . Iliotibial band syndrome of  left side 09/09/2015   Past Surgical History:  Procedure Laterality Date  . CHOLECYSTECTOMY    . INTRAVASCULAR PRESSURE WIRE/FFR STUDY N/A 11/23/2017   Procedure: INTRAVASCULAR PRESSURE WIRE/FFR STUDY;  Surgeon: Jettie Booze, MD;  Location: [place] [facility];  Service: Cardiovascular;  Laterality: N/A;  . LEFT HEART CATH AND CORONARY ANGIOGRAPHY N/A 11/23/2017   Procedure: LEFT HEART CATH AND CORONARY ANGIOGRAPHY;  Surgeon: Jettie Booze, MD;  Location: [place];  Service: Cardiovascular;  Laterality: N/A;  . TUBAL LIGATION       Current Meds  Medication Sig  . cephALEXin (KEFLEX) 500 MG capsule Take 500 mg by mouth 3 (three) times daily.  . cyclobenzaprine (FLEXERIL) 5 MG tablet Take 1 tablet by mouth daily as needed for muscle spasms.   Marland Kitchen levothyroxine (SYNTHROID, LEVOTHROID) 50 MCG tablet Take 50 mcg by mouth daily before breakfast.   . lisinopril (PRINIVIL,ZESTRIL) 5 MG tablet Take 1 tablet by mouth daily.  Marland Kitchen lovastatin (MEVACOR) 20 MG tablet Take 20 mg by mouth at bedtime.  Marland Kitchen omeprazole (PRILOSEC) 20 MG capsule Take 20 mg by mouth daily.   . tamsulosin (FLOMAX) 0.4 MG CAPS capsule Take 0.4 mg by mouth daily.     Allergies:   Metformin and related   Social History   Tobacco Use  . Smoking status: Former Research scientist (life sciences)  . Smokeless tobacco: Never Used  Substance Use Topics  . Alcohol use: Not Currently  . Drug use: Never     Family Hx: The patient's family history includes Heart attack in her  maternal grandfather, maternal grandmother, and maternal uncle; Hypertension in her sister; Leukemia in her mother; Lung cancer in her father.  ROS:   Please see the history of present illness.    As mentioned above no chest pain orthopnea or PND All other systems reviewed and are negative.   Prior CV studies:   The following studies were reviewed today:  Jettie Booze, MD (Primary)    Procedures  INTRAVASCULAR PRESSURE WIRE/FFR STUDY  LEFT HEART CATH  AND CORONARY ANGIOGRAPHY  Conclusion    Mid LM lesion is 30% stenosed. This was not significant by FFR.  Prox LAD lesion is 40% stenosed.  Ost 1st Mrg to 1st Mrg lesion is 25% stenosed.  Prox RCA lesion is 25% stenosed.  The left ventricular systolic function is normal.  LV end diastolic pressure is low.  The left ventricular ejection fraction is 55-65% by visual estimate.  There is no aortic valve stenosis.    Right: Normal size right kidney. No evidence of right renal artery        stenosis. Left:  Normal size of left kidney. Mesenteric: Normal Superior Mesenteric artery findings. Areas of limited visceral study include mesenteric arteries.  Labs/Other Tests and Data Reviewed:    EKG:  EKG from previous visit reveals sinus rhythm and nonspecific ST-T changes.  Recent Labs: No results found for requested labs within last 8760 hours.   Recent Lipid Panel No results found for: CHOL, TRIG, HDL, CHOLHDL, LDLCALC, LDLDIRECT  Wt Readings from Last 3 Encounters:  12/26/17 163 lb (73.9 kg)  11/23/17 158 lb (71.7 kg)  11/20/17 158 lb (71.7 kg)     Objective:    Vital Signs:  There were no vitals taken for this visit.   VITAL SIGNS:  reviewed Unfortunately she could not find her blood pressure apparatus at home but she tells me that it was overall fine.  ASSESSMENT & PLAN:    1. Coronary artery disease: Secondary prevention stressed.  Importance of compliance with diet and medication stressed and she vocalized understanding. 2. Essential hypertension: Blood pressure is overall steady according to the patient 3. Mixed dyslipidemia: She needs a lipid check in 2 I told her to get it done by primary care physician.  In view of her issues with renal stone she is seeing her primary care physician regularly and I told her to get these issues checked also so it saves her visit. 4. Patient will be seen in follow-up appointment in 3 months or earlier if the patient has any  concerns   COVID-19 Education: The signs and symptoms of COVID-19 were discussed with the patient and how to seek care for testing (follow up with PCP or arrange E-visit).  The importance of social distancing was discussed today.  Time:   Today, I have spent 15 minutes with the patient with telehealth technology discussing the above problems.     Medication Adjustments/Labs and Tests Ordered: Current medicines are reviewed at length with the patient today.  Concerns regarding medicines are outlined above.   Tests Ordered: No orders of the defined types were placed in this encounter.   Medication Changes: No orders of the defined types were placed in this encounter.   Follow Up:  3 month  Signed, Jenean Lindau, MD  04/11/2019 10:45 AM    [place]

## 2019-07-09 ENCOUNTER — Ambulatory Visit: Payer: Medicare Other | Admitting: Cardiology

## 2019-07-09 DIAGNOSIS — E559 Vitamin D deficiency, unspecified: Secondary | ICD-10-CM

## 2019-07-09 HISTORY — DX: Vitamin D deficiency, unspecified: E55.9

## 2019-07-16 ENCOUNTER — Encounter: Payer: Self-pay | Admitting: Cardiology

## 2019-07-29 ENCOUNTER — Telehealth: Payer: Self-pay | Admitting: Cardiology

## 2019-07-29 NOTE — Telephone Encounter (Signed)
Patient calling to see if we received the lab results from [facility] - she states she told them to fax them over for her upcoming appt with Dr. Geraldo Pitter.

## 2019-07-29 NOTE — Telephone Encounter (Signed)
Letter sent to [facility] care for records to be faxed at pt's request. Pt has an appointment for 08/12/19.

## 2019-07-29 NOTE — Telephone Encounter (Signed)
Reuben Likes- can you check to see if you received these?  I don't see that they have been scanned in as of yet.

## 2019-08-11 ENCOUNTER — Telehealth: Payer: Self-pay | Admitting: Cardiology

## 2019-08-11 ENCOUNTER — Other Ambulatory Visit: Payer: Self-pay

## 2019-08-11 NOTE — Telephone Encounter (Signed)
Patient wanted to make sure all of her labs and other test results are in the office. She has a lot of things related to those tests she needs to discuss with Dr. Geraldo Pitter. Please let the patient know if anything is missing

## 2019-08-11 NOTE — Telephone Encounter (Signed)
Pt is aware that we have received her labs and recent CT.

## 2019-08-12 ENCOUNTER — Ambulatory Visit (INDEPENDENT_AMBULATORY_CARE_PROVIDER_SITE_OTHER): Payer: Medicare Other

## 2019-08-12 ENCOUNTER — Ambulatory Visit (INDEPENDENT_AMBULATORY_CARE_PROVIDER_SITE_OTHER): Payer: Medicare Other | Admitting: Cardiology

## 2019-08-12 ENCOUNTER — Other Ambulatory Visit: Payer: Self-pay

## 2019-08-12 ENCOUNTER — Encounter: Payer: Self-pay | Admitting: Cardiology

## 2019-08-12 VITALS — BP 192/92 | HR 72 | Temp 98.4°F | Resp 18 | Ht 66.0 in | Wt 156.4 lb

## 2019-08-12 DIAGNOSIS — R002 Palpitations: Secondary | ICD-10-CM

## 2019-08-12 DIAGNOSIS — E782 Mixed hyperlipidemia: Secondary | ICD-10-CM | POA: Diagnosis not present

## 2019-08-12 DIAGNOSIS — I209 Angina pectoris, unspecified: Secondary | ICD-10-CM

## 2019-08-12 DIAGNOSIS — I2510 Atherosclerotic heart disease of native coronary artery without angina pectoris: Secondary | ICD-10-CM | POA: Diagnosis not present

## 2019-08-12 DIAGNOSIS — Z87891 Personal history of nicotine dependence: Secondary | ICD-10-CM | POA: Diagnosis not present

## 2019-08-12 DIAGNOSIS — R079 Chest pain, unspecified: Secondary | ICD-10-CM

## 2019-08-12 HISTORY — DX: Palpitations: R00.2

## 2019-08-12 MED ORDER — NITROGLYCERIN 0.4 MG SL SUBL: 0.4000 mg | SUBLINGUAL_TABLET | SUBLINGUAL | 3 refills | Status: DC | PRN

## 2019-08-12 MED ORDER — ISOSORBIDE MONONITRATE ER 60 MG PO TB24: 60.0000 mg | ORAL_TABLET | Freq: Every day | ORAL | 6 refills | Status: DC

## 2019-08-12 MED ORDER — ROSUVASTATIN CALCIUM 20 MG PO TABS: 20.0000 mg | ORAL_TABLET | Freq: Every day | ORAL | 3 refills | Status: DC

## 2019-08-12 NOTE — Patient Instructions (Addendum)
Medication Instructions:  Your physician has recommended you make the following change in your medication:   Start Imdur 60 mg daily. Take Nitroglycerin as needed for chest pain. Increase Rosuvastatin to 20 mg daily.  *If you need a refill on your cardiac medications before your next appointment, please call your pharmacy*   Lab Work: None ordered If you have labs (blood work) drawn today and your tests are completely normal, you will receive your results only by: Marland Kitchen MyChart Message (if you have MyChart) OR . A paper copy in the mail If you have any lab test that is abnormal or we need to change your treatment, we will call you to review the results.   Testing/Procedures: Your physician has requested that you have a lexiscan myoview. For further information please visit [email or web address]. Please follow instruction sheet, as given.  The test will take approximately 3 to 4 hours to complete; you may bring reading material.  If someone comes with you to your appointment, they will need to remain in the main lobby due to limited space in the testing area. **If you are pregnant or breastfeeding, please notify the nuclear lab prior to your appointment**  How to prepare for your Myocardial Perfusion Test: . Do not eat or drink 3 hours prior to your test, except you may have water. . Do not consume products containing caffeine (regular or decaffeinated) 12 hours prior to your test. (ex: coffee, chocolate, sodas, tea). . Do bring a list of your current medications with you.  If not listed below, you may take your medications as normal. . Do wear comfortable clothes (no dresses or overalls) and walking shoes, tennis shoes preferred (No heels or open toe shoes are allowed). . Do NOT wear cologne, perfume, aftershave, or lotions (deodorant is allowed). . If these instructions are not followed, your test will have to be rescheduled.   WHY IS MY DOCTOR PRESCRIBING ZIO? The Zio system is proven  and trusted by physicians to detect and diagnose irregular heart rhythms -- and has been prescribed to hundreds of thousands of patients.  The FDA has cleared the Zio system to monitor for many different kinds of irregular heart rhythms. In a study, physicians were able to reach a diagnosis 90% of the time with the Zio system1.  You can wear the Zio monitor -- a small, discreet, comfortable patch -- during your normal day-to-day activity, including while you sleep, shower, and exercise, while it records every single heartbeat for analysis.  1Barrett, P., et al. Comparison of 24 Hour Holter Monitoring Versus 14 Day Novel Adhesive Patch Electrocardiographic Monitoring. [place], 2014.  ZIO VS. HOLTER MONITORING The Zio monitor can be comfortably worn for up to 14 days. Holter monitors can be worn for 24 to 48 hours, limiting the time to record any irregular heart rhythms you may have. Zio is able to capture data for the 51% of patients who have their first symptom-triggered arrhythmia after 48 hours.1  LIVE WITHOUT RESTRICTIONS The Zio ambulatory cardiac monitor is a small, unobtrusive, and water-resistant patch--you might even forget you're wearing it. The Zio monitor records and stores every beat of your heart, whether you're sleeping, working out, or showering.  Wear for 2 weeks. Remove 08/26/19    Follow-Up: At [facility], you and your health needs are our priority.  As part of our continuing mission to provide you with exceptional heart care, we have created designated Provider Care Teams.  These Care Teams include your primary  Cardiologist (physician) and Advanced Practice Providers (APPs -  Physician Assistants and Nurse Practitioners) who all work together to provide you with the care you need, when you need it.  We recommend signing up for the patient portal called "MyChart".  Sign up information is provided on this After Visit Summary.  MyChart is used to connect  with patients for Virtual Visits (Telemedicine).  Patients are able to view lab/test results, encounter notes, upcoming appointments, etc.  Non-urgent messages can be sent to your provider as well.   To learn more about what you can do with MyChart, go to [email or web address].    Your next appointment:   1 month(s)  The format for your next appointment:   In Person  Provider:   Jyl Heinz, MD   Other Instructions NA

## 2019-08-12 NOTE — Progress Notes (Signed)
Cardiology Office Note:    Date:  08/12/2019   ID:  Courtney Gardner, DOB Sep 28, 1950, MRN [id number]  PCP:  Marguerita Merles, MD  Cardiologist:  Jenean Lindau, MD   Referring MD: Marguerita Merles, MD    ASSESSMENT:    1. Coronary artery disease involving native coronary artery of native heart without angina pectoris   2. Mixed hyperlipidemia   3. Ex-smoker   4. Angina pectoris (HCC)   5. Chest pain, unspecified type   6. Palpitations    PLAN:    In order of problems listed above:  1. Coronary artery disease: Secondary prevention stressed with the patient.  Importance of compliance with diet medication stressed and she vocalized understanding.  In view of her symptoms of possible angina and multiple risk factors she will have a Lexiscan sestamibi.  Sublingual nitroglycerin prescription was sent, its protocol and 911 protocol explained and the patient vocalized understanding questions were answered to the patient's satisfaction 2. Angina pectoris: As mentioned above.  I also will initiate her on isosorbide mononitrate 60 mg daily 3. Essential hypertension: Blood pressure stable some of her blood pressure issues are due to anxiety hopefully the isosorbide should help 4. Mixed dyslipidemia: Markedly elevated lipids and patient has been advised about this and diet and will double her statin.  She will have blood work when she comes to see me in follow-up appointment in a month. 5. In view of her weak episodes I will do a 2-week monitoring to understand if he has any arrhythmias.  She also complains of palpitations at times.   Medication Adjustments/Labs and Tests Ordered: Current medicines are reviewed at length with the patient today.  Concerns regarding medicines are outlined above.  No orders of the defined types were placed in this encounter.  No orders of the defined types were placed in this encounter.    Chief Complaint  Patient presents with  . New Patient (Initial Visit)   chest tightness and nausea.     History of Present Illness:    Courtney Gardner is a 69 y.o. female.  Patient has history of coronary artery disease, essential hypertension dyslipidemia and diabetes mellitus.  She comes in with her blood work.  Her blood work is significantly abnormal.  She has markedly elevated LDL and hemoglobin A1c is markedly elevated.  I reviewed those records.  She is a poor historian.  She appears anxious.  She mentions to me that her blood pressures are fine at home.  She states that she occasionally has chest tightness.  She also mentions to me that she walks more than a mile without any issues at times.  She gives history of weakness at times and a very complex and weak history.  At the time of my evaluation, the patient is alert awake oriented and in no distress.  Past Medical History:  Diagnosis Date  . Angina pectoris ([place]) 11/20/2017  . Coronary artery disease involving native coronary artery of native heart 12/26/2017  . Essential hypertension 11/20/2017  . Ex-smoker 11/20/2017  . Hyperlipidemia 11/20/2017  . Iliotibial band syndrome of left side 09/09/2015    Past Surgical History:  Procedure Laterality Date  . CHOLECYSTECTOMY    . INTRAVASCULAR PRESSURE WIRE/FFR STUDY N/A 11/23/2017   Procedure: INTRAVASCULAR PRESSURE WIRE/FFR STUDY;  Surgeon: Jettie Booze, MD;  Location: [place] [facility];  Service: Cardiovascular;  Laterality: N/A;  . LEFT HEART CATH AND CORONARY ANGIOGRAPHY N/A 11/23/2017   Procedure: LEFT HEART  CATH AND CORONARY ANGIOGRAPHY;  Surgeon: Jettie Booze, MD;  Location: [place] [facility];  Service: Cardiovascular;  Laterality: N/A;  . TUBAL LIGATION      Current Medications: Current Meds  Medication Sig  . levothyroxine (SYNTHROID, LEVOTHROID) 50 MCG tablet Take 50 mcg by mouth daily before breakfast.   . lisinopril (PRINIVIL,ZESTRIL) 5 MG tablet Take 1 tablet by mouth daily.  . metFORMIN (GLUCOPHAGE) 500 MG tablet Take 500 mg by  mouth 2 (two) times daily.  . metoprolol tartrate (LOPRESSOR) 25 MG tablet Take 25 mg by mouth 2 (two) times daily.  Marland Kitchen omeprazole (PRILOSEC) 40 MG capsule Take 40 mg by mouth daily.  Marland Kitchen PARoxetine (PAXIL) 10 MG tablet Take 10 mg by mouth daily.  . rosuvastatin (CRESTOR) 10 MG tablet Take 10 mg by mouth at bedtime.     Allergies:   Metformin and related   Social History   Socioeconomic History  . Marital status: Unknown    Spouse name: Not on file  . Number of children: Not on file  . Years of education: Not on file  . Highest education level: Not on file  Occupational History  . Not on file  Tobacco Use  . Smoking status: Former Research scientist (life sciences)  . Smokeless tobacco: Never Used  Substance and Sexual Activity  . Alcohol use: Not Currently  . Drug use: Never  . Sexual activity: Not on file  Other Topics Concern  . Not on file  Social History Narrative  . Not on file   Social Determinants of Health   Financial Resource Strain:   . Difficulty of Paying Living Expenses:   Food Insecurity:   . Worried About Charity fundraiser in the Last Year:   . Arboriculturist in the Last Year:   Transportation Needs:   . Film/video editor (Medical):   Marland Kitchen Lack of Transportation (Non-Medical):   Physical Activity:   . Days of Exercise per Week:   . Minutes of Exercise per Session:   Stress:   . Feeling of Stress :   Social Connections:   . Frequency of Communication with Friends and Family:   . Frequency of Social Gatherings with Friends and Family:   . Attends Religious Services:   . Active Member of Clubs or Organizations:   . Attends Archivist Meetings:   Marland Kitchen Marital Status:      Family History: The patient's family history includes Heart attack in her maternal grandfather, maternal grandmother, and maternal uncle; Hypertension in her sister; Leukemia in her mother; Lung cancer in her father.  ROS:   Please see the history of present illness.    All other systems reviewed  and are negative.  EKGs/Labs/Other Studies Reviewed:    The following studies were reviewed today: Jettie Booze, MD (Primary)    Procedures  INTRAVASCULAR PRESSURE WIRE/FFR STUDY  LEFT HEART CATH AND CORONARY ANGIOGRAPHY  Conclusion    Mid LM lesion is 30% stenosed. This was not significant by FFR.  Prox LAD lesion is 40% stenosed.  Ost 1st Mrg to 1st Mrg lesion is 25% stenosed.  Prox RCA lesion is 25% stenosed.  The left ventricular systolic function is normal.  LV end diastolic pressure is low.  The left ventricular ejection fraction is 55-65% by visual estimate.  There is no aortic valve stenosis.   Nonobstructive coronary artery disease.  She may have a component of vasospasm as there was catheter induced spasm on both the RCA  and left main.  Consider long-acting nitrates.  FFR was performed.  Initially, the FFR dropped to 0.83 but with more adenosine, the FFR increased to 0.9.  At 2 minutes 30 seconds, the FFR was 0.92.  I suspect some of the ventricularization of pressure noted in the angiographic appearance may have been catheter induced spasm.  She does have some atherosclerosis in the left main, LAD and obtuse marginal.  Would recommend aggressive preventive therapy.  Recommend Aspirin 81mg  daily for moderate CAD.      Recent Labs: No results found for requested labs within last 8760 hours.  Recent Lipid Panel No results found for: CHOL, TRIG, HDL, CHOLHDL, VLDL, LDLCALC, LDLDIRECT  Physical Exam:    VS:  BP (!) 192/92 (BP Location: Right Arm, Patient Position: Sitting, Cuff Size: Normal)   Pulse 72   Temp 98.4 F (36.9 C)   Resp 18   Ht 5\' 6"  (1.676 m)   Wt 156 lb 6 oz (70.9 kg)   BMI 25.24 kg/m     Wt Readings from Last 3 Encounters:  08/12/19 156 lb 6 oz (70.9 kg)  12/26/17 163 lb (73.9 kg)  11/23/17 158 lb (71.7 kg)     GEN: Patient is in no acute distress HEENT: Normal NECK: No JVD; No carotid bruits LYMPHATICS: No  lymphadenopathy CARDIAC: Hear sounds regular, 2/6 systolic murmur at the apex. RESPIRATORY:  Clear to auscultation without rales, wheezing or rhonchi  ABDOMEN: Soft, non-tender, non-distended MUSCULOSKELETAL:  No edema; No deformity  SKIN: Warm and dry NEUROLOGIC:  Alert and oriented x 3 PSYCHIATRIC:  Normal affect   Signed, Jenean Lindau, MD  08/12/2019 4:09 PM    [facility]

## 2019-08-15 ENCOUNTER — Encounter: Payer: Self-pay | Admitting: Cardiology

## 2019-08-15 DIAGNOSIS — R079 Chest pain, unspecified: Secondary | ICD-10-CM | POA: Diagnosis not present

## 2019-08-20 ENCOUNTER — Telehealth: Payer: Self-pay | Admitting: Cardiology

## 2019-08-20 NOTE — Telephone Encounter (Signed)
New Message  Patient is calling in for her stress test results. Please give patient a call back.

## 2019-08-20 NOTE — Telephone Encounter (Signed)
Discussed lexiscan results with pt. She is agreeable to the echo. Order placed in [place]. Will call with appointment. 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 Pt had no additional questions.

## 2019-08-21 ENCOUNTER — Other Ambulatory Visit: Payer: Self-pay

## 2019-08-21 ENCOUNTER — Telehealth: Payer: Self-pay | Admitting: Cardiology

## 2019-08-21 DIAGNOSIS — I209 Angina pectoris, unspecified: Secondary | ICD-10-CM

## 2019-08-21 DIAGNOSIS — R0602 Shortness of breath: Secondary | ICD-10-CM

## 2019-08-21 NOTE — Telephone Encounter (Signed)
Pt concerned about renal artery calcification that was noted on her 2017 CT scan. CT results from [facility] 2017-current have been printed for Dr. Geraldo Pitter to review. Will call pt back regarding same.

## 2019-08-21 NOTE — Telephone Encounter (Signed)
New message   Per previous message the patient has found the paperwork for renal artery blockage. Please call the patient to discuss.

## 2019-08-25 NOTE — Telephone Encounter (Signed)
Pt is aware of her appointment for echo 09/11/19 at 2:15 and FU with Revankar at 3:15. Pt had no additional questions.

## 2019-09-01 ENCOUNTER — Telehealth: Payer: Self-pay | Admitting: Cardiology

## 2019-09-01 NOTE — Telephone Encounter (Signed)
I agree she needs to go to the emergency room ASAP.

## 2019-09-01 NOTE — Telephone Encounter (Signed)
Pt has agreed to go to the ED for evaluation.

## 2019-09-01 NOTE — Telephone Encounter (Signed)
Pt c/o of Chest Pain: STAT if CP now or developed within 24 hours  1. Are you having CP right now? NO  2. Are you experiencing any other symptoms (ex. SOB, nausea, vomiting, sweating)? When she had the chest pain yesterday she states she was sweating and SOB  3. How long have you been experiencing CP? Yesterday, at 2:30pm   4. Is your CP continuous or coming and going? Comes and goes   5. Have you taken Nitroglycerin? Yes, she took it yesterday. 3x's and it helped with the symptoms.    ?

## 2019-09-01 NOTE — Telephone Encounter (Signed)
Called pt and talked with her about her sx. Pt states that she had relief after taking 3 nitroglycerin. Pt states that she doen't want to go to [facility] because they do not do anything for her. Pt states that she thinks she needs another cath. Pt states that this is scaring her and that she does not think she will live until May 13. I advised her to call 911 or go to the ED for any concerns she has regarding the sx she had earlier.  How do you advise?

## 2019-09-11 ENCOUNTER — Ambulatory Visit (INDEPENDENT_AMBULATORY_CARE_PROVIDER_SITE_OTHER): Payer: Medicare Other

## 2019-09-11 ENCOUNTER — Ambulatory Visit (INDEPENDENT_AMBULATORY_CARE_PROVIDER_SITE_OTHER): Payer: Medicare Other | Admitting: Cardiology

## 2019-09-11 ENCOUNTER — Encounter: Payer: Self-pay | Admitting: Cardiology

## 2019-09-11 ENCOUNTER — Other Ambulatory Visit: Payer: Self-pay

## 2019-09-11 VITALS — BP 142/80 | HR 50 | Ht 66.0 in | Wt 156.0 lb

## 2019-09-11 DIAGNOSIS — I10 Essential (primary) hypertension: Secondary | ICD-10-CM | POA: Diagnosis not present

## 2019-09-11 DIAGNOSIS — E782 Mixed hyperlipidemia: Secondary | ICD-10-CM | POA: Diagnosis not present

## 2019-09-11 DIAGNOSIS — I209 Angina pectoris, unspecified: Secondary | ICD-10-CM | POA: Diagnosis not present

## 2019-09-11 DIAGNOSIS — R0602 Shortness of breath: Secondary | ICD-10-CM | POA: Diagnosis not present

## 2019-09-11 DIAGNOSIS — I25118 Atherosclerotic heart disease of native coronary artery with other forms of angina pectoris: Secondary | ICD-10-CM

## 2019-09-11 DIAGNOSIS — R079 Chest pain, unspecified: Secondary | ICD-10-CM

## 2019-09-11 DIAGNOSIS — Z87891 Personal history of nicotine dependence: Secondary | ICD-10-CM

## 2019-09-11 DIAGNOSIS — R931 Abnormal findings on diagnostic imaging of heart and coronary circulation: Secondary | ICD-10-CM

## 2019-09-11 NOTE — Patient Instructions (Signed)
Medication Instructions:  No medication changes *If you need a refill on your cardiac medications before your next appointment, please call your pharmacy*   Lab Work: Your physician recommends that you have labs in the office today.  You are having a BMTET, CBC, TSH, LFT and Lipids.  If you have labs (blood work) drawn today and your tests are completely normal, you will receive your results only by: Marland Kitchen MyChart Message (if you have MyChart) OR . A paper copy in the mail If you have any lab test that is abnormal or we need to change your treatment, we will call you to review the results.   Testing/Procedures: Your cardiac CT will be scheduled ats:   [facility] [address] [place], [place] [address] [phone]  If scheduled at [facility], please arrive at the [facility] main entrance of [facility] 30 minutes prior to test start time. Proceed to the [facility] Radiology Department (first floor) to check-in and test prep.  If scheduled at [facility], please arrive 15 mins early for check-in and test prep.  Please follow these instructions carefully (unless otherwise directed):  On the Night Before the Test: . Be sure to Drink plenty of water. . Do not consume any caffeinated/decaffeinated beverages or chocolate 12 hours prior to your test. . Do not take any antihistamines 12 hours prior to your test. . If you take Metformin do not take 24 hours prior to test.   On the Day of the Test: . Drink plenty of water. Do not drink any water within one hour of the test. . Do not eat any food 4 hours prior to the test. . You may take your regular medications prior to the test.  . Take metoprolol (Lopressor) two hours prior to test. . FEMALES- please wear underwire-free bra if available      After the Test: . Drink plenty of water. . After receiving IV contrast, you may experience a mild flushed feeling. This is  normal. . On occasion, you may experience a mild rash up to 24 hours after the test. This is not dangerous. If this occurs, you can take Benadryl 25 mg and increase your fluid intake. . If you experience trouble breathing, this can be serious. If it is severe call 911 IMMEDIATELY. If it is mild, please call our office. . If you take any of these medications: Glipizide/Metformin, Avandament, Glucavance, please do not take 48 hours after completing test unless otherwise instructed.   Once we have confirmed authorization from your insurance company, we will call you to set up a date and time for your test.   For non-scheduling related questions, please contact the cardiac imaging nurse navigator should you have any questions/concerns: Marchia Bond, Cardiac Imaging Nurse Navigator Burley Saver, Interim Cardiac Imaging Nurse [place] [facility] Direct Office Dial: [phone]   For scheduling needs, including cancellations and rescheduling, please call [phone].      Follow-Up: At [facility], you and your health needs are our priority.  As part of our continuing mission to provide you with exceptional heart care, we have created designated Provider Care Teams.  These Care Teams include your primary Cardiologist (physician) and Advanced Practice Providers (APPs -  Physician Assistants and Nurse Practitioners) who all work together to provide you with the care you need, when you need it.  We recommend signing up for the patient portal called "MyChart".  Sign up information  is provided on this After Visit Summary.  MyChart is used to connect with patients for Virtual Visits (Telemedicine).  Patients are able to view lab/test results, encounter notes, upcoming appointments, etc.  Non-urgent messages can be sent to your provider as well.   To learn more about what you can do with MyChart, go to [email or web address].    Your next appointment:   2 month(s)  The  format for your next appointment:   In Person  Provider:   Jyl Heinz, MD   Other Instructions  Cardiac CT Angiogram A cardiac CT angiogram is a procedure to look at the heart and the area around the heart. It may be done to help find the cause of chest pains or other symptoms of heart disease. During this procedure, a substance called contrast dye is injected into the blood vessels in the area to be checked. A large X-ray machine, called a CT scanner, then takes detailed pictures of the heart and the surrounding area. The procedure is also sometimes called a coronary CT angiogram, coronary artery scanning, or CTA. A cardiac CT angiogram allows the health care provider to see how well blood is flowing to and from the heart. The health care provider will be able to see if there are any problems, such as:  Blockage or narrowing of the coronary arteries in the heart.  Fluid around the heart.  Signs of weakness or disease in the muscles, valves, and tissues of the heart. Tell a health care provider about:  Any allergies you have. This is especially important if you have had a previous allergic reaction to contrast dye.  All medicines you are taking, including vitamins, herbs, eye drops, creams, and over-the-counter medicines.  Any blood disorders you have.  Any surgeries you have had.  Any medical conditions you have.  Whether you are pregnant or may be pregnant.  Any anxiety disorders, chronic pain, or other conditions you have that may increase your stress or prevent you from lying still. What are the risks? Generally, this is a safe procedure. However, problems may occur, including:  Bleeding.  Infection.  Allergic reactions to medicines or dyes.  Damage to other structures or organs.  Kidney damage from the contrast dye that is used.  Increased risk of cancer from radiation exposure. This risk is low. Talk with your health care provider about: ? The risks and  benefits of testing. ? How you can receive the lowest dose of radiation. What happens before the procedure?  Wear comfortable clothing and remove any jewelry, glasses, dentures, and hearing aids.  Follow instructions from your health care provider about eating and drinking. This may include: ? For 12 hours before the procedure -- avoid caffeine. This includes tea, coffee, soda, energy drinks, and diet pills. Drink plenty of water or other fluids that do not have caffeine in them. Being well hydrated can prevent complications. ? For 4-6 hours before the procedure -- stop eating and drinking. The contrast dye can cause nausea, but this is less likely if your stomach is empty.  Ask your health care provider about changing or stopping your regular medicines. This is especially important if you are taking diabetes medicines, blood thinners, or medicines to treat problems with erections (erectile dysfunction). What happens during the procedure?   Hair on your chest may need to be removed so that small sticky patches called electrodes can be placed on your chest. These will transmit information that helps to monitor your heart during the procedure.  An IV will be inserted into one of your veins.  You might be given a medicine to control your heart rate during the procedure. This will help to ensure that good images are obtained.  You will be asked to lie on an exam table. This table will slide in and out of the CT machine during the procedure.  Contrast dye will be injected into the IV. You might feel warm, or you may get a metallic taste in your mouth.  You will be given a medicine called nitroglycerin. This will relax or dilate the arteries in your heart.  The table that you are lying on will move into the CT machine tunnel for the scan.  The person running the machine will give you instructions while the scans are being done. You may be asked to: ? Keep your arms above your head. ? Hold  your breath. ? Stay very still, even if the table is moving.  When the scanning is complete, you will be moved out of the machine.  The IV will be removed. The procedure may vary among health care providers and hospitals. What can I expect after the procedure? After your procedure, it is common to have:  A metallic taste in your mouth from the contrast dye.  A feeling of warmth.  A headache from the nitroglycerin. Follow these instructions at home:  Take over-the-counter and prescription medicines only as told by your health care provider.  If you are told, drink enough fluid to keep your urine pale yellow. This will help to flush the contrast dye out of your body.  Most people can return to their normal activities right after the procedure. Ask your health care provider what activities are safe for you.  It is up to you to get the results of your procedure. Ask your health care provider, or the department that is doing the procedure, when your results will be ready.  Keep all follow-up visits as told by your health care provider. This is important. Contact a health care provider if:  You have any symptoms of allergy to the contrast dye. These include: ? Shortness of breath. ? Rash or hives. ? A racing heartbeat. Summary  A cardiac CT angiogram is a procedure to look at the heart and the area around the heart. It may be done to help find the cause of chest pains or other symptoms of heart disease.  During this procedure, a large X-ray machine, called a CT scanner, takes detailed pictures of the heart and the surrounding area after a contrast dye has been injected into blood vessels in the area.  Ask your health care provider about changing or stopping your regular medicines before the procedure. This is especially important if you are taking diabetes medicines, blood thinners, or medicines to treat erectile dysfunction.  If you are told, drink enough fluid to keep your urine  pale yellow. This will help to flush the contrast dye out of your body. This information is not intended to replace advice given to you by your health care provider. Make sure you discuss any questions you have with your health care provider. Document Revised: 12/11/2018 Document Reviewed: 12/11/2018 Elsevier Patient Education  [place].

## 2019-09-11 NOTE — Progress Notes (Signed)
Cardiology Office Note:    Date:  09/11/2019   ID:  Courtney Gardner, DOB Mar 28, 1951, MRN [id number]  PCP:  Marguerita Merles, MD  Cardiologist:  Jenean Lindau, MD   Referring MD: Marguerita Merles, MD    ASSESSMENT:    1. Angina pectoris ([place])   2. Coronary artery disease involving native coronary artery of native heart with other form of angina pectoris ([place])   3. Mixed hyperlipidemia   4. Essential hypertension   5. Ex-smoker    PLAN:    In order of problems listed above:  1. Coronary artery disease: Secondary prevention stressed with the patient.  Importance of compliance with diet medication stressed and she vocalized understanding. 2. Angina pectoris: Symptoms are concerning.  Stress test negative for evidence of ischemia but abnormal stress test.  In view of this I suggested invasive and noninvasive evaluation with coronary angiography and CT coronary angiography and she prefers the latter.  I will set this up for her.  In the interim if she has any significant symptoms she knows to go to nearest emergency room. 3. Essential hypertension: Blood pressure stable 4. Mixed dyslipidemia: Diet was discussed and will have blood work today including fasting lipids as she has not had anything to eat since midnight last night 5. Patient will be seen in follow-up appointment in 1 months or earlier if the patient has any concerns    Medication Adjustments/Labs and Tests Ordered: Current medicines are reviewed at length with the patient today.  Concerns regarding medicines are outlined above.  No orders of the defined types were placed in this encounter.  No orders of the defined types were placed in this encounter.    No chief complaint on file.    History of Present Illness:    Courtney Gardner is a 69 y.o. female.  Patient has past medical history of coronary artery disease nonobstructive in nature.  She mentions to me that when she starts to exert herself at times she has chest  tightness.  She is very worried about this.  She has family history of coronary artery disease.  She was in tears and started crying about her worries about having heart attacks.  She is concerned about her symptoms and has used nitroglycerin as needed for the symptoms with relief.  At the time of my evaluation, the patient is alert awake oriented and in no distress.  Past Medical History:  Diagnosis Date  . Angina pectoris ([place]) 11/20/2017  . Coronary artery disease involving native coronary artery of native heart 12/26/2017  . Essential hypertension 11/20/2017  . Ex-smoker 11/20/2017  . Hyperlipidemia 11/20/2017  . Iliotibial band syndrome of left side 09/09/2015    Past Surgical History:  Procedure Laterality Date  . CHOLECYSTECTOMY    . INTRAVASCULAR PRESSURE WIRE/FFR STUDY N/A 11/23/2017   Procedure: INTRAVASCULAR PRESSURE WIRE/FFR STUDY;  Surgeon: Jettie Booze, MD;  Location: [facility];  Service: Cardiovascular;  Laterality: N/A;  . LEFT HEART CATH AND CORONARY ANGIOGRAPHY N/A 11/23/2017   Procedure: LEFT HEART CATH AND CORONARY ANGIOGRAPHY;  Surgeon: Jettie Booze, MD;  Location: [facility];  Service: Cardiovascular;  Laterality: N/A;  . TUBAL LIGATION      Current Medications: Current Meds  Medication Sig  . cyclobenzaprine (FLEXERIL) 5 MG tablet Take 1 tablet by mouth daily as needed for muscle spasms.   . isosorbide mononitrate (IMDUR) 60 MG 24 hr tablet Take 1 tablet (60 mg total) by  mouth daily.  Marland Kitchen levothyroxine (SYNTHROID, LEVOTHROID) 50 MCG tablet Take 50 mcg by mouth daily before breakfast.   . lisinopril (PRINIVIL,ZESTRIL) 5 MG tablet Take 1 tablet by mouth daily.  Marland Kitchen lovastatin (MEVACOR) 20 MG tablet Take 20 mg by mouth at bedtime.  . metFORMIN (GLUCOPHAGE) 500 MG tablet Take 500 mg by mouth 2 (two) times daily.  . metoprolol tartrate (LOPRESSOR) 25 MG tablet Take 25 mg by mouth 2 (two) times daily.  . nitroGLYCERIN (NITROSTAT) 0.4 MG SL tablet  Place 1 tablet (0.4 mg total) under the tongue every 5 (five) minutes as needed for chest pain.  Marland Kitchen omeprazole (PRILOSEC) 40 MG capsule Take 40 mg by mouth daily.  Marland Kitchen PARoxetine (PAXIL) 10 MG tablet Take 10 mg by mouth daily.     Allergies:   Metformin and related   Social History   Socioeconomic History  . Marital status: Unknown    Spouse name: Not on file  . Number of children: Not on file  . Years of education: Not on file  . Highest education level: Not on file  Occupational History  . Not on file  Tobacco Use  . Smoking status: Former Research scientist (life sciences)  . Smokeless tobacco: Never Used  Substance and Sexual Activity  . Alcohol use: Not Currently  . Drug use: Never  . Sexual activity: Not on file  Other Topics Concern  . Not on file  Social History Narrative  . Not on file   Social Determinants of Health   Financial Resource Strain:   . Difficulty of Paying Living Expenses:   Food Insecurity:   . Worried About Charity fundraiser in the Last Year:   . Arboriculturist in the Last Year:   Transportation Needs:   . Film/video editor (Medical):   Marland Kitchen Lack of Transportation (Non-Medical):   Physical Activity:   . Days of Exercise per Week:   . Minutes of Exercise per Session:   Stress:   . Feeling of Stress :   Social Connections:   . Frequency of Communication with Friends and Family:   . Frequency of Social Gatherings with Friends and Family:   . Attends Religious Services:   . Active Member of Clubs or Organizations:   . Attends Archivist Meetings:   Marland Kitchen Marital Status:      Family History: The patient's family history includes Heart attack in her maternal grandfather, maternal grandmother, and maternal uncle; Hypertension in her sister; Leukemia in her mother; Lung cancer in her father.  ROS:   Please see the history of present illness.    All other systems reviewed and are negative.  EKGs/Labs/Other Studies Reviewed:    The following studies were  reviewed today: Jettie Booze, MD (Primary)    Procedures  INTRAVASCULAR PRESSURE WIRE/FFR STUDY  LEFT HEART CATH AND CORONARY ANGIOGRAPHY  Conclusion    Mid LM lesion is 30% stenosed. This was not significant by FFR.  Prox LAD lesion is 40% stenosed.  Ost 1st Mrg to 1st Mrg lesion is 25% stenosed.  Prox RCA lesion is 25% stenosed.  The left ventricular systolic function is normal.  LV end diastolic pressure is low.  The left ventricular ejection fraction is 55-65% by visual estimate.  There is no aortic valve stenosis.   Nonobstructive coronary artery disease.  She may have a component of vasospasm as there was catheter induced spasm on both the RCA and left main.  Consider long-acting nitrates.  FFR was  performed.  Initially, the FFR dropped to 0.83 but with more adenosine, the FFR increased to 0.9.  At 2 minutes 30 seconds, the FFR was 0.92.  I suspect some of the ventricularization of pressure noted in the angiographic appearance may have been catheter induced spasm.  She does have some atherosclerosis in the left main, LAD and obtuse marginal.  Would recommend aggressive preventive therapy.  Recommend Aspirin 81mg  daily for moderate CAD.      Recent Labs: No results found for requested labs within last 8760 hours.  Recent Lipid Panel No results found for: CHOL, TRIG, HDL, CHOLHDL, VLDL, LDLCALC, LDLDIRECT  Physical Exam:    VS:  BP (!) 142/80   Pulse (!) 50   Ht 5\' 6"  (1.676 m)   Wt 156 lb (70.8 kg)   SpO2 98%   BMI 25.18 kg/m     Wt Readings from Last 3 Encounters:  09/11/19 156 lb (70.8 kg)  08/12/19 156 lb 6 oz (70.9 kg)  12/26/17 163 lb (73.9 kg)     GEN: Patient is in no acute distress HEENT: Normal NECK: No JVD; No carotid bruits LYMPHATICS: No lymphadenopathy CARDIAC: Hear sounds regular, 2/6 systolic murmur at the apex. RESPIRATORY:  Clear to auscultation without rales, wheezing or rhonchi  ABDOMEN: Soft, non-tender, non-distended  MUSCULOSKELETAL:  No edema; No deformity  SKIN: Warm and dry NEUROLOGIC:  Alert and oriented x 3 PSYCHIATRIC:  Normal affect   Signed, Jenean Lindau, MD  09/11/2019 3:40 PM    [facility]

## 2019-09-12 ENCOUNTER — Telehealth: Payer: Self-pay

## 2019-09-12 DIAGNOSIS — I25118 Atherosclerotic heart disease of native coronary artery with other forms of angina pectoris: Secondary | ICD-10-CM

## 2019-09-12 LAB — BASIC METABOLIC PANEL
BUN/Creatinine Ratio: 11 — ABNORMAL LOW (ref 12–28)
BUN: 12 mg/dL (ref 8–27)
CO2: 26 mmol/L (ref 20–29)
Calcium: 10 mg/dL (ref 8.7–10.3)
Chloride: 103 mmol/L (ref 96–106)
Creatinine, Ser: 1.07 mg/dL — ABNORMAL HIGH (ref 0.57–1.00)
GFR calc Af Amer: 61 mL/min/{1.73_m2} (ref >59)
GFR calc non Af Amer: 53 mL/min/{1.73_m2} — ABNORMAL LOW (ref >59)
Glucose: 104 mg/dL — ABNORMAL HIGH (ref 65–99)
Potassium: 5.3 mmol/L — ABNORMAL HIGH (ref 3.5–5.2)
Sodium: 141 mmol/L (ref 134–144)

## 2019-09-12 LAB — LIPID PANEL
Chol/HDL Ratio: 2 ratio (ref 0.0–4.4)
Cholesterol, Total: 138 mg/dL (ref 100–199)
HDL: 68 mg/dL (ref >39)
LDL Chol Calc (NIH): 56 mg/dL (ref 0–99)
Triglycerides: 73 mg/dL (ref 0–149)
VLDL Cholesterol Cal: 14 mg/dL (ref 5–40)

## 2019-09-12 LAB — CBC WITH DIFFERENTIAL/PLATELET
Basophils Absolute: 0.2 10*3/uL (ref 0.0–0.2)
Basos: 2 %
EOS (ABSOLUTE): 0.2 10*3/uL (ref 0.0–0.4)
Eos: 3 %
Hematocrit: 43.3 % (ref 34.0–46.6)
Hemoglobin: 14.1 g/dL (ref 11.1–15.9)
Immature Grans (Abs): 0 10*3/uL (ref 0.0–0.1)
Immature Granulocytes: 1 %
Lymphocytes Absolute: 2.5 10*3/uL (ref 0.7–3.1)
Lymphs: 32 %
MCH: 30.3 pg (ref 26.6–33.0)
MCHC: 32.6 g/dL (ref 31.5–35.7)
MCV: 93 fL (ref 79–97)
Monocytes Absolute: 0.7 10*3/uL (ref 0.1–0.9)
Monocytes: 9 %
Neutrophils Absolute: 4.3 10*3/uL (ref 1.4–7.0)
Neutrophils: 53 %
Platelets: 303 10*3/uL (ref 150–450)
RBC: 4.65 x10E6/uL (ref 3.77–5.28)
RDW: 12.6 % (ref 11.7–15.4)
WBC: 7.9 10*3/uL (ref 3.4–10.8)

## 2019-09-12 LAB — HEPATIC FUNCTION PANEL
ALT: 16 IU/L (ref 0–32)
AST: 22 IU/L (ref 0–40)
Albumin: 4.6 g/dL (ref 3.8–4.8)
Alkaline Phosphatase: 93 IU/L (ref 39–117)
Bilirubin Total: 0.6 mg/dL (ref 0.0–1.2)
Bilirubin, Direct: 0.2 mg/dL (ref 0.00–0.40)
Total Protein: 7.2 g/dL (ref 6.0–8.5)

## 2019-09-12 LAB — TSH: TSH: 1.7 u[IU]/mL (ref 0.450–4.500)

## 2019-09-12 NOTE — Telephone Encounter (Signed)
Spoke with patient regarding results and recommendation.  Patient verbalizes understanding and is agreeable to plan of care. Advised patient to call back with any issues or concerns.  

## 2019-09-12 NOTE — Telephone Encounter (Signed)
-----   Message from Jenean Lindau, MD sent at 09/12/2019 11:34 AM EDT ----- Lipids are fine.  Have patient keep diet low in potassium and recheck in 1 week.  Make sure patient is not taking any potassium supplementation.  The results of the study is unremarkable. Please inform patient. I will discuss in detail at next appointment. Cc  primary care/referring physician Jenean Lindau, MD 09/12/2019 11:33 AM

## 2019-09-16 ENCOUNTER — Telehealth: Payer: Self-pay | Admitting: Cardiology

## 2019-09-16 NOTE — Telephone Encounter (Signed)
Pt c/o medication issue:  1. Name of Medication: lisinopril (PRINIVIL,ZESTRIL) 5 MG tablet and isosorbide mononitrate (IMDUR) 60 MG 24 hr tablet  2. How are you currently taking this medication (dosage and times per day)? Took lisinopril today and has not taken isosorbide.   3. Are you having a reaction (difficulty breathing--STAT)? no  4. What is your medication issue? Patient wants to know if she needs to stop taking lisinopril so she can begin taking isosorbide. She states that she took one lisinopril today but can start the isosorbide tomorrow morning.

## 2019-09-16 NOTE — Telephone Encounter (Signed)
Message left for pt to call pt back.

## 2019-09-17 NOTE — Telephone Encounter (Signed)
Called and spoke with pt to discuss how to take her medications. Pt verbalized understanding and had no additional questions.

## 2019-09-22 ENCOUNTER — Encounter: Payer: Self-pay | Admitting: Cardiology

## 2019-09-23 ENCOUNTER — Telehealth (HOSPITAL_COMMUNITY): Payer: Self-pay | Admitting: *Deleted

## 2019-09-23 NOTE — Telephone Encounter (Signed)
Reaching out to patient to offer assistance regarding upcoming cardiac imaging study; pt verbalizes understanding of appt date/time, parking situation and where to check in, pre-test NPO status and medications ordered, and verified current allergies; name and call back number provided for further questions should they arise  Courtney Cavagnaro Tai RN Navigator Cardiac Imaging [facility] [phone] office [phone] cell 

## 2019-09-23 NOTE — Telephone Encounter (Signed)
Pt states that she wanted the cardiologist reading her cardiac CT scan to know that she has significant cardiac family history.

## 2019-09-24 ENCOUNTER — Other Ambulatory Visit: Payer: Self-pay

## 2019-09-24 ENCOUNTER — Ambulatory Visit (HOSPITAL_COMMUNITY)
Admission: RE | Admit: 2019-09-24 | Discharge: 2019-09-24 | Disposition: A | Discharge status: Home / Self Care | Payer: Medicare Other | Source: Ambulatory Visit | Attending: Cardiology | Admitting: Cardiology

## 2019-09-24 DIAGNOSIS — R931 Abnormal findings on diagnostic imaging of heart and coronary circulation: Secondary | ICD-10-CM | POA: Diagnosis present

## 2019-09-24 DIAGNOSIS — R079 Chest pain, unspecified: Secondary | ICD-10-CM | POA: Diagnosis present

## 2019-09-24 DIAGNOSIS — I209 Angina pectoris, unspecified: Secondary | ICD-10-CM | POA: Insufficient documentation

## 2019-09-24 DIAGNOSIS — I2510 Atherosclerotic heart disease of native coronary artery without angina pectoris: Secondary | ICD-10-CM | POA: Diagnosis not present

## 2019-09-24 MED ORDER — IOHEXOL 350 MG/ML SOLN
80.0000 mL | Freq: Once | INTRAVENOUS | Status: AC | PRN
  Administered 2019-09-24: 80 mL via INTRAVENOUS

## 2019-09-24 MED ORDER — NITROGLYCERIN 0.4 MG SL SUBL: 0.8000 mg | SUBLINGUAL_TABLET | SUBLINGUAL | Status: DC | PRN

## 2019-09-24 MED ORDER — NITROGLYCERIN 0.4 MG SL SUBL
SUBLINGUAL_TABLET | SUBLINGUAL | Status: AC
  Administered 2019-09-24: 0.8 mg via SUBLINGUAL
  Filled 2019-09-24: qty 2

## 2019-09-25 ENCOUNTER — Telehealth: Payer: Self-pay

## 2019-09-25 ENCOUNTER — Encounter: Payer: Self-pay | Admitting: Cardiology

## 2019-09-25 ENCOUNTER — Ambulatory Visit (INDEPENDENT_AMBULATORY_CARE_PROVIDER_SITE_OTHER): Payer: Medicare Other | Admitting: Cardiology

## 2019-09-25 VITALS — BP 170/94 | HR 66 | Ht 66.0 in | Wt 153.0 lb

## 2019-09-25 DIAGNOSIS — Z87891 Personal history of nicotine dependence: Secondary | ICD-10-CM | POA: Diagnosis not present

## 2019-09-25 DIAGNOSIS — I209 Angina pectoris, unspecified: Secondary | ICD-10-CM

## 2019-09-25 DIAGNOSIS — E782 Mixed hyperlipidemia: Secondary | ICD-10-CM | POA: Diagnosis not present

## 2019-09-25 DIAGNOSIS — R931 Abnormal findings on diagnostic imaging of heart and coronary circulation: Secondary | ICD-10-CM

## 2019-09-25 DIAGNOSIS — Z6825 Body mass index (BMI) 25.0-25.9, adult: Secondary | ICD-10-CM

## 2019-09-25 DIAGNOSIS — I2510 Atherosclerotic heart disease of native coronary artery without angina pectoris: Secondary | ICD-10-CM

## 2019-09-25 MED ORDER — ALPRAZOLAM 0.25 MG PO TABS
0.2500 mg | ORAL_TABLET | Freq: Every evening | ORAL | 0 refills | Status: DC | PRN
Start: 2019-09-25 — End: 2019-12-09

## 2019-09-25 MED ORDER — ASPIRIN EC 81 MG PO TBEC
81.0000 mg | DELAYED_RELEASE_TABLET | Freq: Every day | ORAL | 3 refills | Status: DC
Start: 2019-09-25 — End: 2020-11-17

## 2019-09-25 NOTE — H&P (View-Only) (Signed)
Cardiology Office Note:    Date:  09/25/2019   ID:  Courtney Gardner, DOB 11/23/1950, MRN [id number]  PCP:  Marguerita Merles, MD  Cardiologist:  Jenean Lindau, MD   Referring MD: Marguerita Merles, MD    ASSESSMENT:    1. Angina pectoris ([place])   2. Coronary artery disease involving native coronary artery of native heart, angina presence unspecified   3. Mixed hyperlipidemia   4. Ex-smoker    PLAN:    In order of problems listed above:  1. Angina pectoris: Coronary artery disease: I discussed my findings with the patient at extensive length.  Her symptoms are very concerning and following recommendations were given to her.I discussed coronary angiography and left heart catheterization with the patient at extensive length. Procedure, benefits and potential risks were explained. Patient had multiple questions which were answered to the patient's satisfaction. Patient agreed and consented for the procedure. Further recommendations will be made based on the findings of the coronary angiography. In the interim. The patient has any significant symptoms he knows to go to the nearest emergency room. 2. Essential hypertension: Blood pressure stable.  She has an element of whitecoat hypertension.  She appears very anxious.  In view of this I told her to take Xanax 0.5 mg on a as needed basis precautions advised and she vocalized understanding. 3. Sublingual nitroglycerin prescription was sent, its protocol and 911 protocol explained and the patient vocalized understanding questions were answered to the patient's satisfaction 4. She was also advised to take a coated 81 mg aspirin on a daily basis 5. She will be seen in follow-up appointment after the coronary angiography.  Patient and her friend multiple questions which were answered to the satisfaction.   Medication Adjustments/Labs and Tests Ordered: Current medicines are reviewed at length with the patient today.  Concerns regarding medicines are  outlined above.  No orders of the defined types were placed in this encounter.  No orders of the defined types were placed in this encounter.    Chief Complaint  Patient presents with  . Discuss test results     History of Present Illness:    Courtney Gardner is a 69 y.o. female.  Patient has past medical history of nonobstructive coronary artery disease, essential hypertension and dyslipidemia.  Patient was evaluated with a stress test which did not reveal any evidence of ischemia however continued chest pain suggesting angina made me order a CT coronary angiography and this revealed significant obstructive disease especially of the left main and the details are mentioned below.  Patient is happy that she is being evaluated and her symptoms are being investigated.  She comes in today with her friend.  No chest pain orthopnea or PND.  At the time of my evaluation, the patient is alert awake oriented and in no distress.  Past Medical History:  Diagnosis Date  . Angina pectoris ([place]) 11/20/2017  . Coronary artery disease involving native coronary artery of native heart 12/26/2017  . Essential hypertension 11/20/2017  . Ex-smoker 11/20/2017  . Hyperlipidemia 11/20/2017  . Iliotibial band syndrome of left side 09/09/2015    Past Surgical History:  Procedure Laterality Date  . CHOLECYSTECTOMY    . INTRAVASCULAR PRESSURE WIRE/FFR STUDY N/A 11/23/2017   Procedure: INTRAVASCULAR PRESSURE WIRE/FFR STUDY;  Surgeon: Jettie Booze, MD;  Location: [facility];  Service: Cardiovascular;  Laterality: N/A;  . LEFT HEART CATH AND CORONARY ANGIOGRAPHY N/A 11/23/2017   Procedure: LEFT HEART CATH  AND CORONARY ANGIOGRAPHY;  Surgeon: Jettie Booze, MD;  Location: [facility];  Service: Cardiovascular;  Laterality: N/A;  . TUBAL LIGATION      Current Medications: Current Meds  Medication Sig  . cyclobenzaprine (FLEXERIL) 5 MG tablet Take 1 tablet by mouth daily as needed for muscle  spasms.   . isosorbide mononitrate (IMDUR) 60 MG 24 hr tablet Take 1 tablet (60 mg total) by mouth daily.  Marland Kitchen levothyroxine (SYNTHROID, LEVOTHROID) 50 MCG tablet Take 50 mcg by mouth daily before breakfast.   . lisinopril (ZESTRIL) 20 MG tablet Take 20 mg by mouth daily.  Marland Kitchen lovastatin (MEVACOR) 20 MG tablet Take 20 mg by mouth at bedtime.  . metFORMIN (GLUCOPHAGE) 500 MG tablet Take 500 mg by mouth 2 (two) times daily.  . metoprolol tartrate (LOPRESSOR) 25 MG tablet Take 25 mg by mouth 2 (two) times daily.  . nitroGLYCERIN (NITROSTAT) 0.4 MG SL tablet Place 1 tablet (0.4 mg total) under the tongue every 5 (five) minutes as needed for chest pain.  Marland Kitchen omeprazole (PRILOSEC) 40 MG capsule Take 40 mg by mouth daily.  Marland Kitchen PARoxetine (PAXIL) 10 MG tablet Take 10 mg by mouth daily.     Allergies:   Metformin and related   Social History   Socioeconomic History  . Marital status: Unknown    Spouse name: Not on file  . Number of children: Not on file  . Years of education: Not on file  . Highest education level: Not on file  Occupational History  . Not on file  Tobacco Use  . Smoking status: Former Research scientist (life sciences)  . Smokeless tobacco: Never Used  Substance and Sexual Activity  . Alcohol use: Not Currently  . Drug use: Never  . Sexual activity: Not on file  Other Topics Concern  . Not on file  Social History Narrative  . Not on file   Social Determinants of Health   Financial Resource Strain:   . Difficulty of Paying Living Expenses:   Food Insecurity:   . Worried About Charity fundraiser in the Last Year:   . Arboriculturist in the Last Year:   Transportation Needs:   . Film/video editor (Medical):   Marland Kitchen Lack of Transportation (Non-Medical):   Physical Activity:   . Days of Exercise per Week:   . Minutes of Exercise per Session:   Stress:   . Feeling of Stress :   Social Connections:   . Frequency of Communication with Friends and Family:   . Frequency of Social Gatherings with  Friends and Family:   . Attends Religious Services:   . Active Member of Clubs or Organizations:   . Attends Archivist Meetings:   Marland Kitchen Marital Status:      Family History: The patient's family history includes Heart attack in her maternal grandfather, maternal grandmother, and maternal uncle; Hypertension in her sister; Leukemia in her mother; Lung cancer in her father.  ROS:   Please see the history of present illness.    All other systems reviewed and are negative.  EKGs/Labs/Other Studies Reviewed:    The following studies were reviewed today: IMPRESSION: 1. Severe left main disease with flow limitation that continues into LAD and Circumflex arteries.  2.  Recommend cardiac catheterization.  Candee Furbish, MD [facility]    Recent Labs: 09/11/2019: ALT 16; BUN 12; Creatinine, Ser 1.07; Hemoglobin 14.1; Platelets 303; Potassium 5.3; Sodium 141; TSH 1.700  Recent Lipid Panel  Component Value Date/Time   CHOL 138 09/11/2019 1705   TRIG 73 09/11/2019 1705   HDL 68 09/11/2019 1705   CHOLHDL 2.0 09/11/2019 1705   LDLCALC 56 09/11/2019 1705    Physical Exam:    VS:  BP (!) 170/94   Pulse 66   Ht 5\' 6"  (1.676 m)   Wt 153 lb (69.4 kg)   SpO2 95%   BMI 24.69 kg/m     Wt Readings from Last 3 Encounters:  09/25/19 153 lb (69.4 kg)  09/11/19 156 lb (70.8 kg)  08/12/19 156 lb 6 oz (70.9 kg)     GEN: Patient is in no acute distress HEENT: Normal NECK: No JVD; No carotid bruits LYMPHATICS: No lymphadenopathy CARDIAC: Hear sounds regular, 2/6 systolic murmur at the apex. RESPIRATORY:  Clear to auscultation without rales, wheezing or rhonchi  ABDOMEN: Soft, non-tender, non-distended MUSCULOSKELETAL:  No edema; No deformity  SKIN: Warm and dry NEUROLOGIC:  Alert and oriented x 3 PSYCHIATRIC:  Normal affect   Signed, Jenean Lindau, MD  09/25/2019 2:57 PM     [facility]

## 2019-09-25 NOTE — Telephone Encounter (Signed)
Called pt to schedule an appt for today to discuss CT FFR results. Pt has an appt 09/25/19.

## 2019-09-25 NOTE — Progress Notes (Signed)
Cardiology Office Note:    Date:  09/25/2019   ID:  Courtney Gardner, DOB December 02, 1950, MRN [id number]  PCP:  Marguerita Merles, MD  Cardiologist:  Jenean Lindau, MD   Referring MD: Marguerita Merles, MD    ASSESSMENT:    1. Angina pectoris ([place])   2. Coronary artery disease involving native coronary artery of native heart, angina presence unspecified   3. Mixed hyperlipidemia   4. Ex-smoker    PLAN:    In order of problems listed above:  1. Angina pectoris: Coronary artery disease: I discussed my findings with the patient at extensive length.  Her symptoms are very concerning and following recommendations were given to her.I discussed coronary angiography and left heart catheterization with the patient at extensive length. Procedure, benefits and potential risks were explained. Patient had multiple questions which were answered to the patient's satisfaction. Patient agreed and consented for the procedure. Further recommendations will be made based on the findings of the coronary angiography. In the interim. The patient has any significant symptoms he knows to go to the nearest emergency room. 2. Essential hypertension: Blood pressure stable.  She has an element of whitecoat hypertension.  She appears very anxious.  In view of this I told her to take Xanax 0.5 mg on a as needed basis precautions advised and she vocalized understanding. 3. Sublingual nitroglycerin prescription was sent, its protocol and 911 protocol explained and the patient vocalized understanding questions were answered to the patient's satisfaction 4. She was also advised to take a coated 81 mg aspirin on a daily basis 5. She will be seen in follow-up appointment after the coronary angiography.  Patient and her friend multiple questions which were answered to the satisfaction.   Medication Adjustments/Labs and Tests Ordered: Current medicines are reviewed at length with the patient today.  Concerns regarding medicines are  outlined above.  No orders of the defined types were placed in this encounter.  No orders of the defined types were placed in this encounter.    Chief Complaint  Patient presents with  . Discuss test results     History of Present Illness:    Courtney Gardner is a 69 y.o. female.  Patient has past medical history of nonobstructive coronary artery disease, essential hypertension and dyslipidemia.  Patient was evaluated with a stress test which did not reveal any evidence of ischemia however continued chest pain suggesting angina made me order a CT coronary angiography and this revealed significant obstructive disease especially of the left main and the details are mentioned below.  Patient is happy that she is being evaluated and her symptoms are being investigated.  She comes in today with her friend.  No chest pain orthopnea or PND.  At the time of my evaluation, the patient is alert awake oriented and in no distress.  Past Medical History:  Diagnosis Date  . Angina pectoris ([place]) 11/20/2017  . Coronary artery disease involving native coronary artery of native heart 12/26/2017  . Essential hypertension 11/20/2017  . Ex-smoker 11/20/2017  . Hyperlipidemia 11/20/2017  . Iliotibial band syndrome of left side 09/09/2015    Past Surgical History:  Procedure Laterality Date  . CHOLECYSTECTOMY    . INTRAVASCULAR PRESSURE WIRE/FFR STUDY N/A 11/23/2017   Procedure: INTRAVASCULAR PRESSURE WIRE/FFR STUDY;  Surgeon: Jettie Booze, MD;  Location: [facility];  Service: Cardiovascular;  Laterality: N/A;  . LEFT HEART CATH AND CORONARY ANGIOGRAPHY N/A 11/23/2017   Procedure: LEFT HEART CATH  AND CORONARY ANGIOGRAPHY;  Surgeon: Jettie Booze, MD;  Location: [facility];  Service: Cardiovascular;  Laterality: N/A;  . TUBAL LIGATION      Current Medications: Current Meds  Medication Sig  . cyclobenzaprine (FLEXERIL) 5 MG tablet Take 1 tablet by mouth daily as needed for muscle  spasms.   . isosorbide mononitrate (IMDUR) 60 MG 24 hr tablet Take 1 tablet (60 mg total) by mouth daily.  Marland Kitchen levothyroxine (SYNTHROID, LEVOTHROID) 50 MCG tablet Take 50 mcg by mouth daily before breakfast.   . lisinopril (ZESTRIL) 20 MG tablet Take 20 mg by mouth daily.  Marland Kitchen lovastatin (MEVACOR) 20 MG tablet Take 20 mg by mouth at bedtime.  . metFORMIN (GLUCOPHAGE) 500 MG tablet Take 500 mg by mouth 2 (two) times daily.  . metoprolol tartrate (LOPRESSOR) 25 MG tablet Take 25 mg by mouth 2 (two) times daily.  . nitroGLYCERIN (NITROSTAT) 0.4 MG SL tablet Place 1 tablet (0.4 mg total) under the tongue every 5 (five) minutes as needed for chest pain.  Marland Kitchen omeprazole (PRILOSEC) 40 MG capsule Take 40 mg by mouth daily.  Marland Kitchen PARoxetine (PAXIL) 10 MG tablet Take 10 mg by mouth daily.     Allergies:   Metformin and related   Social History   Socioeconomic History  . Marital status: Unknown    Spouse name: Not on file  . Number of children: Not on file  . Years of education: Not on file  . Highest education level: Not on file  Occupational History  . Not on file  Tobacco Use  . Smoking status: Former Research scientist (life sciences)  . Smokeless tobacco: Never Used  Substance and Sexual Activity  . Alcohol use: Not Currently  . Drug use: Never  . Sexual activity: Not on file  Other Topics Concern  . Not on file  Social History Narrative  . Not on file   Social Determinants of Health   Financial Resource Strain:   . Difficulty of Paying Living Expenses:   Food Insecurity:   . Worried About Charity fundraiser in the Last Year:   . Arboriculturist in the Last Year:   Transportation Needs:   . Film/video editor (Medical):   Marland Kitchen Lack of Transportation (Non-Medical):   Physical Activity:   . Days of Exercise per Week:   . Minutes of Exercise per Session:   Stress:   . Feeling of Stress :   Social Connections:   . Frequency of Communication with Friends and Family:   . Frequency of Social Gatherings with  Friends and Family:   . Attends Religious Services:   . Active Member of Clubs or Organizations:   . Attends Archivist Meetings:   Marland Kitchen Marital Status:      Family History: The patient's family history includes Heart attack in her maternal grandfather, maternal grandmother, and maternal uncle; Hypertension in her sister; Leukemia in her mother; Lung cancer in her father.  ROS:   Please see the history of present illness.    All other systems reviewed and are negative.  EKGs/Labs/Other Studies Reviewed:    The following studies were reviewed today: IMPRESSION: 1. Severe left main disease with flow limitation that continues into LAD and Circumflex arteries.  2.  Recommend cardiac catheterization.  Candee Furbish, MD [facility]    Recent Labs: 09/11/2019: ALT 16; BUN 12; Creatinine, Ser 1.07; Hemoglobin 14.1; Platelets 303; Potassium 5.3; Sodium 141; TSH 1.700  Recent Lipid Panel  Component Value Date/Time   CHOL 138 09/11/2019 1705   TRIG 73 09/11/2019 1705   HDL 68 09/11/2019 1705   CHOLHDL 2.0 09/11/2019 1705   LDLCALC 56 09/11/2019 1705    Physical Exam:    VS:  BP (!) 170/94   Pulse 66   Ht 5\' 6"  (1.676 m)   Wt 153 lb (69.4 kg)   SpO2 95%   BMI 24.69 kg/m     Wt Readings from Last 3 Encounters:  09/25/19 153 lb (69.4 kg)  09/11/19 156 lb (70.8 kg)  08/12/19 156 lb 6 oz (70.9 kg)     GEN: Patient is in no acute distress HEENT: Normal NECK: No JVD; No carotid bruits LYMPHATICS: No lymphadenopathy CARDIAC: Hear sounds regular, 2/6 systolic murmur at the apex. RESPIRATORY:  Clear to auscultation without rales, wheezing or rhonchi  ABDOMEN: Soft, non-tender, non-distended MUSCULOSKELETAL:  No edema; No deformity  SKIN: Warm and dry NEUROLOGIC:  Alert and oriented x 3 PSYCHIATRIC:  Normal affect   Signed, Jenean Lindau, MD  09/25/2019 2:57 PM     [facility]

## 2019-09-25 NOTE — Patient Instructions (Signed)
Medication Instructions:  Your physician has recommended you make the following change in your medication:   Start taking Coated Aspirin 81 mg daily. Take Xanax 0.25 mg daily as needed for chest tightness.  *If you need a refill on your cardiac medications before your next appointment, please call your pharmacy*   Lab Work: Your physician recommends that you have a BMET today in the office.  If you have labs (blood work) drawn today and your tests are completely normal, you will receive your results only by: Marland Kitchen MyChart Message (if you have MyChart) OR . A paper copy in the mail If you have any lab test that is abnormal or we need to change your treatment, we will call you to review the results.   Testing/Procedures:    [place] [id number] Dept: [phone] Loc: [place]  09/25/2019  You are scheduled for a Cardiac Catheterization on Tuesday, June 1 with Dr. Shelva Majestic.  1. Please arrive at the [facility] (Main Entrance A) at [facility]: [address] [place], [place] [address] at 8:30 AM (This time is two hours before your procedure to ensure your preparation). Free valet parking service is available.   Special note: Every effort is made to have your procedure done on time. Please understand that emergencies sometimes delay scheduled procedures.  2. Diet: Do not eat solid foods after midnight.  The patient may have clear liquids until 5am upon the day of the procedure.  3. Labs: You had your labs today in th office.  4. Medication instructions in preparation for your procedure:   Contrast Allergy: No    Stop taking, Lisinopril (Zestril or Prinivil) Tuesday, June 1,   Do not take Diabetes Med Glucophage (Metformin) on the day of the procedure and HOLD 48 HOURS AFTER THE PROCEDURE.  On the morning of your procedure, take your  Aspirin and any morning medicines NOT listed above.  You may use sips of water.  5. Plan for one night stay--bring personal belongings. 6. Bring a current list of your medications and current insurance cards. 7. You MUST have a responsible person to drive you home. 8. Someone MUST be with you the first 24 hours after you arrive home or your discharge will be delayed. 9. Please wear clothes that are easy to get on and off and wear slip-on shoes.  Thank you for allowing [place] to care for you!   --  Invasive Cardiovascular services    Follow-Up: At [facility], you and your health needs are our priority.  As part of our continuing mission to provide you with exceptional heart care, we have created designated Provider Care Teams.  These Care Teams include your primary Cardiologist (physician) and Advanced Practice Providers (APPs -  Physician Assistants and Nurse Practitioners) who all work together to provide you with the care you need, when you need it.  We recommend signing up for the patient portal called "MyChart".  Sign up information is provided on this After Visit Summary.  MyChart is used to connect with patients for Virtual Visits (Telemedicine).  Patients are able to view lab/test results, encounter notes, upcoming appointments, etc.  Non-urgent messages can be sent to your provider as well.   To learn more about what you can do with MyChart, go to [email or web address].    Your next appointment:   1 month(s)  The format for  your next appointment:   In Person  Provider:   Jyl Heinz, MD   Other Instructions NA

## 2019-09-26 LAB — BASIC METABOLIC PANEL
BUN/Creatinine Ratio: 9 — ABNORMAL LOW (ref 12–28)
BUN: 10 mg/dL (ref 8–27)
CO2: 23 mmol/L (ref 20–29)
Calcium: 9.8 mg/dL (ref 8.7–10.3)
Chloride: 101 mmol/L (ref 96–106)
Creatinine, Ser: 1.09 mg/dL — ABNORMAL HIGH (ref 0.57–1.00)
GFR calc Af Amer: 60 mL/min/{1.73_m2} (ref >59)
GFR calc non Af Amer: 52 mL/min/{1.73_m2} — ABNORMAL LOW (ref >59)
Glucose: 160 mg/dL — ABNORMAL HIGH (ref 65–99)
Potassium: 4.2 mmol/L (ref 3.5–5.2)
Sodium: 139 mmol/L (ref 134–144)

## 2019-09-27 ENCOUNTER — Other Ambulatory Visit (HOSPITAL_COMMUNITY)
Admission: RE | Admit: 2019-09-27 | Discharge: 2019-09-27 | Disposition: A | Discharge status: Home / Self Care | Payer: Medicare Other | Source: Ambulatory Visit | Attending: Cardiovascular Disease | Admitting: Cardiovascular Disease

## 2019-09-27 DIAGNOSIS — Z01812 Encounter for preprocedural laboratory examination: Secondary | ICD-10-CM | POA: Insufficient documentation

## 2019-09-27 DIAGNOSIS — Z20822 Contact with and (suspected) exposure to covid-19: Secondary | ICD-10-CM | POA: Insufficient documentation

## 2019-09-27 LAB — SARS CORONAVIRUS 2 (TAT 6-24 HRS): SARS Coronavirus 2: NEGATIVE

## 2019-09-30 ENCOUNTER — Inpatient Hospital Stay (HOSPITAL_COMMUNITY)
Admission: RE | Admit: 2019-09-30 | Discharge: 2019-10-08 | DRG: 233 | Disposition: A | Discharge status: Home / Self Care | Payer: Medicare Other | Attending: Thoracic Surgery (Cardiothoracic Vascular Surgery) | Admitting: Thoracic Surgery (Cardiothoracic Vascular Surgery)

## 2019-09-30 ENCOUNTER — Other Ambulatory Visit: Payer: Self-pay

## 2019-09-30 ENCOUNTER — Encounter (HOSPITAL_COMMUNITY)
Admission: RE | Disposition: A | Discharge status: Home Health | Payer: Self-pay | Source: Home / Self Care | Attending: Thoracic Surgery (Cardiothoracic Vascular Surgery)

## 2019-09-30 DIAGNOSIS — I25119 Atherosclerotic heart disease of native coronary artery with unspecified angina pectoris: Secondary | ICD-10-CM | POA: Diagnosis not present

## 2019-09-30 DIAGNOSIS — I480 Paroxysmal atrial fibrillation: Secondary | ICD-10-CM | POA: Diagnosis not present

## 2019-09-30 DIAGNOSIS — I471 Supraventricular tachycardia: Secondary | ICD-10-CM | POA: Diagnosis not present

## 2019-09-30 DIAGNOSIS — Z01818 Encounter for other preprocedural examination: Secondary | ICD-10-CM

## 2019-09-30 DIAGNOSIS — Z20822 Contact with and (suspected) exposure to covid-19: Secondary | ICD-10-CM | POA: Diagnosis present

## 2019-09-30 DIAGNOSIS — I209 Angina pectoris, unspecified: Secondary | ICD-10-CM | POA: Diagnosis present

## 2019-09-30 DIAGNOSIS — Z79899 Other long term (current) drug therapy: Secondary | ICD-10-CM

## 2019-09-30 DIAGNOSIS — I2584 Coronary atherosclerosis due to calcified coronary lesion: Secondary | ICD-10-CM | POA: Diagnosis present

## 2019-09-30 DIAGNOSIS — R001 Bradycardia, unspecified: Secondary | ICD-10-CM | POA: Diagnosis not present

## 2019-09-30 DIAGNOSIS — E8770 Fluid overload, unspecified: Secondary | ICD-10-CM | POA: Diagnosis not present

## 2019-09-30 DIAGNOSIS — D62 Acute posthemorrhagic anemia: Secondary | ICD-10-CM | POA: Diagnosis not present

## 2019-09-30 DIAGNOSIS — Z888 Allergy status to other drugs, medicaments and biological substances status: Secondary | ICD-10-CM | POA: Diagnosis not present

## 2019-09-30 DIAGNOSIS — Z951 Presence of aortocoronary bypass graft: Secondary | ICD-10-CM

## 2019-09-30 DIAGNOSIS — Z8249 Family history of ischemic heart disease and other diseases of the circulatory system: Secondary | ICD-10-CM | POA: Diagnosis not present

## 2019-09-30 DIAGNOSIS — E785 Hyperlipidemia, unspecified: Secondary | ICD-10-CM | POA: Diagnosis present

## 2019-09-30 DIAGNOSIS — T8119XA Other postprocedural shock, initial encounter: Secondary | ICD-10-CM | POA: Diagnosis not present

## 2019-09-30 DIAGNOSIS — E119 Type 2 diabetes mellitus without complications: Secondary | ICD-10-CM | POA: Diagnosis present

## 2019-09-30 DIAGNOSIS — N200 Calculus of kidney: Secondary | ICD-10-CM

## 2019-09-30 DIAGNOSIS — I252 Old myocardial infarction: Secondary | ICD-10-CM

## 2019-09-30 DIAGNOSIS — E876 Hypokalemia: Secondary | ICD-10-CM | POA: Diagnosis not present

## 2019-09-30 DIAGNOSIS — E249 Cushing's syndrome, unspecified: Secondary | ICD-10-CM | POA: Diagnosis present

## 2019-09-30 DIAGNOSIS — I701 Atherosclerosis of renal artery: Secondary | ICD-10-CM | POA: Diagnosis present

## 2019-09-30 DIAGNOSIS — K2270 Barrett's esophagus without dysplasia: Secondary | ICD-10-CM

## 2019-09-30 DIAGNOSIS — E039 Hypothyroidism, unspecified: Secondary | ICD-10-CM | POA: Diagnosis present

## 2019-09-30 DIAGNOSIS — E782 Mixed hyperlipidemia: Secondary | ICD-10-CM | POA: Diagnosis present

## 2019-09-30 DIAGNOSIS — Y832 Surgical operation with anastomosis, bypass or graft as the cause of abnormal reaction of the patient, or of later complication, without mention of misadventure at the time of the procedure: Secondary | ICD-10-CM | POA: Diagnosis not present

## 2019-09-30 DIAGNOSIS — H409 Unspecified glaucoma: Secondary | ICD-10-CM

## 2019-09-30 DIAGNOSIS — I25110 Atherosclerotic heart disease of native coronary artery with unstable angina pectoris: Secondary | ICD-10-CM | POA: Diagnosis present

## 2019-09-30 DIAGNOSIS — Z79890 Hormone replacement therapy (postmenopausal): Secondary | ICD-10-CM

## 2019-09-30 DIAGNOSIS — N179 Acute kidney failure, unspecified: Secondary | ICD-10-CM | POA: Diagnosis not present

## 2019-09-30 DIAGNOSIS — Z7984 Long term (current) use of oral hypoglycemic drugs: Secondary | ICD-10-CM

## 2019-09-30 DIAGNOSIS — Z87891 Personal history of nicotine dependence: Secondary | ICD-10-CM | POA: Diagnosis not present

## 2019-09-30 DIAGNOSIS — R110 Nausea: Secondary | ICD-10-CM | POA: Diagnosis not present

## 2019-09-30 DIAGNOSIS — G4730 Sleep apnea, unspecified: Secondary | ICD-10-CM | POA: Diagnosis present

## 2019-09-30 DIAGNOSIS — Z01810 Encounter for preprocedural cardiovascular examination: Secondary | ICD-10-CM | POA: Diagnosis not present

## 2019-09-30 DIAGNOSIS — I9789 Other postprocedural complications and disorders of the circulatory system, not elsewhere classified: Secondary | ICD-10-CM | POA: Diagnosis not present

## 2019-09-30 DIAGNOSIS — I10 Essential (primary) hypertension: Secondary | ICD-10-CM | POA: Diagnosis present

## 2019-09-30 DIAGNOSIS — I2510 Atherosclerotic heart disease of native coronary artery without angina pectoris: Secondary | ICD-10-CM

## 2019-09-30 HISTORY — DX: Barrett's esophagus without dysplasia: K22.70

## 2019-09-30 HISTORY — DX: Calculus of kidney: N20.0

## 2019-09-30 HISTORY — PX: LEFT HEART CATH AND CORONARY ANGIOGRAPHY: CATH118249

## 2019-09-30 HISTORY — DX: Unspecified glaucoma: H40.9

## 2019-09-30 HISTORY — DX: Type 2 diabetes mellitus without complications: E11.9

## 2019-09-30 LAB — MRSA PCR SCREENING: MRSA by PCR: NEGATIVE

## 2019-09-30 SURGERY — LEFT HEART CATH AND CORONARY ANGIOGRAPHY
Anesthesia: LOCAL

## 2019-09-30 MED ORDER — MIDAZOLAM HCL 2 MG/2ML IJ SOLN
INTRAMUSCULAR | Status: AC
  Filled 2019-09-30: qty 2

## 2019-09-30 MED ORDER — ASPIRIN 81 MG PO CHEW: 81.0000 mg | CHEWABLE_TABLET | Freq: Every day | ORAL | Status: DC

## 2019-09-30 MED ORDER — LIDOCAINE HCL (PF) 1 % IJ SOLN
INTRAMUSCULAR | Status: DC | PRN
  Administered 2019-09-30: 2 mL

## 2019-09-30 MED ORDER — LABETALOL HCL 5 MG/ML IV SOLN: 10.0000 mg | INTRAVENOUS | Status: AC | PRN

## 2019-09-30 MED ORDER — CYCLOBENZAPRINE HCL 10 MG PO TABS
5.0000 mg | ORAL_TABLET | Freq: Every day | ORAL | Status: DC | PRN
  Filled 2019-09-30: qty 1

## 2019-09-30 MED ORDER — DIAZEPAM 5 MG PO TABS
5.0000 mg | ORAL_TABLET | Freq: Four times a day (QID) | ORAL | Status: DC | PRN
  Administered 2019-09-30 – 2019-10-02 (×2): 5 mg via ORAL
  Filled 2019-09-30: qty 1

## 2019-09-30 MED ORDER — METOPROLOL TARTRATE 25 MG PO TABS
25.0000 mg | ORAL_TABLET | Freq: Two times a day (BID) | ORAL | Status: DC
  Administered 2019-09-30 – 2019-10-01 (×2): 25 mg via ORAL
  Filled 2019-09-30 (×3): qty 1

## 2019-09-30 MED ORDER — ACETAMINOPHEN 325 MG PO TABS: 650.0000 mg | ORAL_TABLET | ORAL | Status: DC | PRN

## 2019-09-30 MED ORDER — ALPRAZOLAM 0.25 MG PO TABS
0.2500 mg | ORAL_TABLET | Freq: Every evening | ORAL | Status: DC | PRN
  Administered 2019-09-30: 0.25 mg via ORAL
  Filled 2019-09-30: qty 1

## 2019-09-30 MED ORDER — ONDANSETRON HCL 4 MG/2ML IJ SOLN: 4.0000 mg | Freq: Four times a day (QID) | INTRAMUSCULAR | Status: DC | PRN

## 2019-09-30 MED ORDER — NITROGLYCERIN IN D5W 200-5 MCG/ML-% IV SOLN
INTRAVENOUS | Status: AC
  Filled 2019-09-30: qty 250

## 2019-09-30 MED ORDER — LIDOCAINE HCL (PF) 1 % IJ SOLN
INTRAMUSCULAR | Status: AC
  Filled 2019-09-30: qty 30

## 2019-09-30 MED ORDER — ATORVASTATIN CALCIUM 80 MG PO TABS
80.0000 mg | ORAL_TABLET | Freq: Every day | ORAL | Status: DC
  Administered 2019-09-30 – 2019-10-08 (×7): 80 mg via ORAL
  Filled 2019-09-30 (×7): qty 1

## 2019-09-30 MED ORDER — SODIUM CHLORIDE 0.9 % IV SOLN: INTRAVENOUS | Status: DC

## 2019-09-30 MED ORDER — NITROGLYCERIN IN D5W 200-5 MCG/ML-% IV SOLN
0.0000 ug/min | INTRAVENOUS | Status: DC
  Administered 2019-10-02: 20 ug/min via INTRAVENOUS
  Filled 2019-09-30: qty 250

## 2019-09-30 MED ORDER — SODIUM CHLORIDE 0.9 % WEIGHT BASED INFUSION: 1.0000 mL/kg/h | INTRAVENOUS | Status: DC

## 2019-09-30 MED ORDER — FENTANYL CITRATE (PF) 100 MCG/2ML IJ SOLN
INTRAMUSCULAR | Status: AC
  Filled 2019-09-30: qty 2

## 2019-09-30 MED ORDER — HEPARIN SODIUM (PORCINE) 1000 UNIT/ML IJ SOLN
INTRAMUSCULAR | Status: DC | PRN
  Administered 2019-09-30: 3500 [IU] via INTRAVENOUS

## 2019-09-30 MED ORDER — SODIUM CHLORIDE 0.9% FLUSH: 3.0000 mL | INTRAVENOUS | Status: DC | PRN

## 2019-09-30 MED ORDER — VERAPAMIL HCL 2.5 MG/ML IV SOLN
INTRAVENOUS | Status: AC
  Filled 2019-09-30: qty 2

## 2019-09-30 MED ORDER — NITROGLYCERIN IN D5W 200-5 MCG/ML-% IV SOLN
INTRAVENOUS | Status: AC | PRN
  Administered 2019-09-30: 5 ug/min via INTRAVENOUS

## 2019-09-30 MED ORDER — PAROXETINE HCL 10 MG PO TABS
10.0000 mg | ORAL_TABLET | Freq: Every day | ORAL | Status: DC
  Administered 2019-09-30 – 2019-10-08 (×7): 10 mg via ORAL
  Filled 2019-09-30 (×11): qty 1

## 2019-09-30 MED ORDER — MIDAZOLAM HCL 2 MG/2ML IJ SOLN
INTRAMUSCULAR | Status: DC | PRN
  Administered 2019-09-30: 1 mg via INTRAVENOUS
  Administered 2019-09-30: 2 mg via INTRAVENOUS

## 2019-09-30 MED ORDER — HYDRALAZINE HCL 20 MG/ML IJ SOLN: 10.0000 mg | INTRAMUSCULAR | Status: AC | PRN

## 2019-09-30 MED ORDER — FENTANYL CITRATE (PF) 100 MCG/2ML IJ SOLN
INTRAMUSCULAR | Status: DC | PRN
  Administered 2019-09-30 (×2): 25 ug via INTRAVENOUS

## 2019-09-30 MED ORDER — SODIUM CHLORIDE 0.9 % IV SOLN: 250.0000 mL | INTRAVENOUS | Status: DC | PRN

## 2019-09-30 MED ORDER — SODIUM CHLORIDE 0.9% FLUSH
3.0000 mL | Freq: Two times a day (BID) | INTRAVENOUS | Status: DC
  Administered 2019-10-01 (×2): 3 mL via INTRAVENOUS

## 2019-09-30 MED ORDER — SODIUM CHLORIDE 0.9% FLUSH: 3.0000 mL | Freq: Two times a day (BID) | INTRAVENOUS | Status: DC

## 2019-09-30 MED ORDER — IOHEXOL 350 MG/ML SOLN
INTRAVENOUS | Status: DC | PRN
  Administered 2019-09-30: 45 mL via INTRACARDIAC

## 2019-09-30 MED ORDER — DIAZEPAM 5 MG PO TABS
ORAL_TABLET | ORAL | Status: AC
  Filled 2019-09-30: qty 1

## 2019-09-30 MED ORDER — SODIUM CHLORIDE 0.9 % WEIGHT BASED INFUSION
3.0000 mL/kg/h | INTRAVENOUS | Status: DC
  Administered 2019-09-30: 3 mL/kg/h via INTRAVENOUS

## 2019-09-30 MED ORDER — ASPIRIN EC 81 MG PO TBEC
81.0000 mg | DELAYED_RELEASE_TABLET | Freq: Every day | ORAL | Status: DC
  Administered 2019-10-01: 81 mg via ORAL
  Filled 2019-09-30: qty 1

## 2019-09-30 MED ORDER — NITROGLYCERIN 0.4 MG SL SUBL: 0.4000 mg | SUBLINGUAL_TABLET | SUBLINGUAL | Status: DC | PRN

## 2019-09-30 MED ORDER — NITROGLYCERIN IN D5W 200-5 MCG/ML-% IV SOLN: 0.0000 ug/min | INTRAVENOUS | Status: DC

## 2019-09-30 MED ORDER — ASPIRIN 81 MG PO CHEW: 81.0000 mg | CHEWABLE_TABLET | ORAL | Status: DC

## 2019-09-30 MED ORDER — HEPARIN (PORCINE) IN NACL 1000-0.9 UT/500ML-% IV SOLN
INTRAVENOUS | Status: AC
  Filled 2019-09-30: qty 1000

## 2019-09-30 MED ORDER — HEPARIN (PORCINE) IN NACL 1000-0.9 UT/500ML-% IV SOLN
INTRAVENOUS | Status: DC | PRN
  Administered 2019-09-30 (×2): 500 mL

## 2019-09-30 MED ORDER — LEVOTHYROXINE SODIUM 50 MCG PO TABS
50.0000 ug | ORAL_TABLET | Freq: Every day | ORAL | Status: DC
  Administered 2019-10-01 – 2019-10-08 (×7): 50 ug via ORAL
  Filled 2019-09-30 (×7): qty 1

## 2019-09-30 MED ORDER — VERAPAMIL HCL 2.5 MG/ML IV SOLN
INTRAVENOUS | Status: DC | PRN
  Administered 2019-09-30: 10 mL via INTRA_ARTERIAL

## 2019-09-30 SURGICAL SUPPLY — 11 items
CATH INFINITI JR4 5F (CATHETERS) ×1 IMPLANT
CATH OPTITORQUE TIG 4.0 5F (CATHETERS) ×1 IMPLANT
DEVICE RAD TR BAND REGULAR (VASCULAR PRODUCTS) ×1 IMPLANT
GLIDESHEATH SLEND SS 6F .021 (SHEATH) ×1 IMPLANT
GUIDEWIRE INQWIRE 1.5J.035X260 (WIRE) IMPLANT
INQWIRE 1.5J .035X260CM (WIRE) ×2
KIT HEART LEFT (KITS) ×2 IMPLANT
PACK CARDIAC CATHETERIZATION (CUSTOM PROCEDURE TRAY) ×2 IMPLANT
SHEATH PROBE COVER 6X72 (BAG) ×1 IMPLANT
TRANSDUCER W/STOPCOCK (MISCELLANEOUS) ×2 IMPLANT
TUBING CIL FLEX 10 FLL-RA (TUBING) ×2 IMPLANT
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
## 2019-09-30 NOTE — Plan of Care (Signed)

## 2019-09-30 NOTE — Plan of Care (Signed)
Problem: Education: Goal: Understanding of CV disease, CV risk reduction, and recovery process will improve 09/30/2019 1601 by Heriberto Antigua D, RN Outcome: Progressing 09/30/2019 1559 by Heriberto Antigua D, RN Outcome: Progressing Goal: Individualized Educational Video(s) 09/30/2019 1601 by Heriberto Antigua D, RN Outcome: Progressing 09/30/2019 1559 by Heriberto Antigua D, RN Outcome: Progressing   Problem: Activity: Goal: Ability to return to baseline activity level will improve 09/30/2019 1601 by Burt Knack, Tejal Monroy D, RN Outcome: Progressing 09/30/2019 1559 by Burt Knack, Nori Winegar D, RN Outcome: Progressing   Problem: Cardiovascular: Goal: Ability to achieve and maintain adequate cardiovascular perfusion will improve 09/30/2019 1601 by Burt Knack, Gresia Isidoro D, RN Outcome: Progressing 09/30/2019 1559 by Burt Knack, Sufian Ravi D, RN Outcome: Progressing Goal: Vascular access site(s) Level 0-1 will be maintained 09/30/2019 1601 by Burt Knack, Ikeem Cleckler D, RN Outcome: Progressing 09/30/2019 1559 by Burt Knack, Akire Rennert D, RN Outcome: Progressing   Problem: Health Behavior/Discharge Planning: Goal: Ability to safely manage health-related needs after discharge will improve 09/30/2019 1601 by Burt Knack, Shalin Vonbargen D, RN Outcome: Progressing 09/30/2019 1559 by Burt Knack, Cherie Lasalle D, RN Outcome: Progressing   Problem: Education: Goal: Knowledge of General Education information will improve Description: Including pain rating scale, medication(s)/side effects and non-pharmacologic comfort measures 09/30/2019 1601 by Burt Knack, Aleah Ahlgrim D, RN Outcome: Progressing 09/30/2019 1559 by Burt Knack, Iline Buchinger D, RN Outcome: Progressing   Problem: Health Behavior/Discharge Planning: Goal: Ability to manage health-related needs will improve 09/30/2019 1601 by Burt Knack, Elektra Wartman D, RN Outcome: Progressing 09/30/2019 1559 by Burt Knack, Arhaan Chesnut D, RN Outcome: Progressing   Problem: Clinical Measurements: Goal: Ability to maintain clinical measurements within normal limits will improve 09/30/2019 1601 by  Burt Knack, Genette Huertas D, RN Outcome: Progressing 09/30/2019 1559 by Burt Knack, Amylynn Fano D, RN Outcome: Progressing Goal: Will remain free from infection 09/30/2019 1601 by Burt Knack, Jamielee Mchale D, RN Outcome: Progressing 09/30/2019 1559 by Burt Knack, Kelechi Astarita D, RN Outcome: Progressing Goal: Diagnostic test results will improve 09/30/2019 1601 by Burt Knack, Seamus Warehime D, RN Outcome: Progressing 09/30/2019 1559 by Burt Knack, Haylynn Pha D, RN Outcome: Progressing Goal: Respiratory complications will improve 09/30/2019 1601 by Heriberto Antigua D, RN Outcome: Progressing 09/30/2019 1559 by Burt Knack, Fabiola Mudgett D, RN Outcome: Progressing Goal: Cardiovascular complication will be avoided 09/30/2019 1601 by Heriberto Antigua D, RN Outcome: Progressing 09/30/2019 1559 by Heriberto Antigua D, RN Outcome: Progressing   Problem: Activity: Goal: Risk for activity intolerance will decrease 09/30/2019 1601 by Heriberto Antigua D, RN Outcome: Progressing 09/30/2019 1559 by Burt Knack, Achsah Mcquade D, RN Outcome: Progressing   Problem: Nutrition: Goal: Adequate nutrition will be maintained 09/30/2019 1601 by Heriberto Antigua D, RN Outcome: Progressing 09/30/2019 1559 by Burt Knack, Celenia Hruska D, RN Outcome: Progressing   Problem: Coping: Goal: Level of anxiety will decrease 09/30/2019 1601 by Heriberto Antigua D, RN Outcome: Progressing 09/30/2019 1559 by Burt Knack, Samaria Anes D, RN Outcome: Progressing   Problem: Elimination: Goal: Will not experience complications related to bowel motility 09/30/2019 1601 by Heriberto Antigua D, RN Outcome: Progressing 09/30/2019 1559 by Burt Knack, Kenson Groh D, RN Outcome: Progressing Goal: Will not experience complications related to urinary retention 09/30/2019 1601 by Burt Knack, Anastazja Isaac D, RN Outcome: Progressing 09/30/2019 1559 by Burt Knack, Josephyne Tarter D, RN Outcome: Progressing   Problem: Pain Managment: Goal: General experience of comfort will improve 09/30/2019 1601 by Burt Knack, Cathren Sween D, RN Outcome: Progressing 09/30/2019 1559 by Burt Knack, Stephane Junkins D, RN Outcome: Progressing   Problem:  Safety: Goal: Ability to remain free from injury will improve 09/30/2019 1601 by Burt Knack, Mathew Storck D, RN Outcome: Progressing 09/30/2019 1559 by Burt Knack, Shawnique Mariotti D, RN Outcome: Progressing   Problem: Skin Integrity: Goal: Risk for impaired skin  integrity will decrease 09/30/2019 1601 by Heriberto Antigua D, RN Outcome: Progressing 09/30/2019 1559 by Heriberto Antigua D, RN Outcome: Progressing

## 2019-09-30 NOTE — Interval H&P Note (Signed)
Cath Lab Visit (complete for each Cath Lab visit)  Clinical Evaluation Leading to the Procedure:   ACS: No.  Non-ACS:    Anginal Classification: CCS III  Anti-ischemic medical therapy: Maximal Therapy (2 or more classes of medications)  Non-Invasive Test Results: High-risk stress test findings: cardiac mortality >3%/year  Prior CABG: No previous CABG      History and Physical Interval Note:  09/30/2019 12:41 PM  Courtney Gardner  has presented today for surgery, with the diagnosis of angina.  The various methods of treatment have been discussed with the patient and family. After consideration of risks, benefits and other options for treatment, the patient has consented to  Procedure(s): LEFT HEART CATH AND CORONARY ANGIOGRAPHY (N/A) as a surgical intervention.  The patient's history has been reviewed, patient examined, no change in status, stable for surgery.  I have reviewed the patient's chart and labs.  Questions were answered to the patient's satisfaction.     Shelva Majestic

## 2019-09-30 NOTE — Progress Notes (Addendum)
3 cc removed from TR band, patient started bleeding at site, RN placed 3cc back in TR band. Site no longer actively bleeding. Will continue to monitor site frequently.

## 2019-09-30 NOTE — Plan of Care (Signed)
  Problem: Education: Goal: Understanding of CV disease, CV risk reduction, and recovery process will improve Outcome: Progressing   Problem: Cardiovascular: Goal: Vascular access site(s) Level 0-1 will be maintained Outcome: Progressing   Problem: Education: Goal: Knowledge of General Education information will improve Description: Including pain rating scale, medication(s)/side effects and non-pharmacologic comfort measures Outcome: Progressing   Problem: Clinical Measurements: Goal: Ability to maintain clinical measurements within normal limits will improve Outcome: Progressing   Problem: Clinical Measurements: Goal: Cardiovascular complication will be avoided Outcome: Progressing   Problem: Nutrition: Goal: Adequate nutrition will be maintained Outcome: Progressing   Problem: Coping: Goal: Level of anxiety will decrease Outcome: Progressing   Problem: Pain Managment: Goal: General experience of comfort will improve Outcome: Progressing   Problem: Skin Integrity: Goal: Risk for impaired skin integrity will decrease Outcome: Progressing

## 2019-10-01 ENCOUNTER — Inpatient Hospital Stay (HOSPITAL_COMMUNITY): Payer: Medicare Other

## 2019-10-01 ENCOUNTER — Encounter (HOSPITAL_COMMUNITY): Payer: Self-pay | Admitting: Cardiovascular Disease

## 2019-10-01 ENCOUNTER — Telehealth: Payer: Self-pay | Admitting: Cardiology

## 2019-10-01 DIAGNOSIS — Z01810 Encounter for preprocedural cardiovascular examination: Secondary | ICD-10-CM

## 2019-10-01 DIAGNOSIS — I25110 Atherosclerotic heart disease of native coronary artery with unstable angina pectoris: Secondary | ICD-10-CM

## 2019-10-01 LAB — BASIC METABOLIC PANEL
Anion gap: 9 (ref 5–15)
BUN: 11 mg/dL (ref 8–23)
CO2: 25 mmol/L (ref 22–32)
Calcium: 9.3 mg/dL (ref 8.9–10.3)
Chloride: 107 mmol/L (ref 98–111)
Creatinine, Ser: 1.01 mg/dL — ABNORMAL HIGH (ref 0.44–1.00)
GFR calc Af Amer: >60 mL/min (ref >60)
GFR calc non Af Amer: 57 mL/min — ABNORMAL LOW (ref >60)
Glucose, Bld: 113 mg/dL — ABNORMAL HIGH (ref 70–99)
Potassium: 3.8 mmol/L (ref 3.5–5.1)
Sodium: 141 mmol/L (ref 135–145)

## 2019-10-01 LAB — CBC
HCT: 41.1 % (ref 36.0–46.0)
Hemoglobin: 13.4 g/dL (ref 12.0–15.0)
MCH: 30.6 pg (ref 26.0–34.0)
MCHC: 32.6 g/dL (ref 30.0–36.0)
MCV: 93.8 fL (ref 80.0–100.0)
Platelets: 259 10*3/uL (ref 150–400)
RBC: 4.38 MIL/uL (ref 3.87–5.11)
RDW: 12.9 % (ref 11.5–15.5)
WBC: 7.7 10*3/uL (ref 4.0–10.5)
nRBC: 0 % (ref 0.0–0.2)

## 2019-10-01 LAB — HEMOGLOBIN A1C
Hgb A1c MFr Bld: 7.7 % — ABNORMAL HIGH (ref 4.8–5.6)
Mean Plasma Glucose: 174.29 mg/dL

## 2019-10-01 LAB — ABO/RH: ABO/RH(D): O POS

## 2019-10-01 LAB — PREPARE RBC (CROSSMATCH)

## 2019-10-01 MED ORDER — PHENYLEPHRINE HCL-NACL 20-0.9 MG/250ML-% IV SOLN
30.0000 ug/min | INTRAVENOUS | Status: DC
  Filled 2019-10-01: qty 250

## 2019-10-01 MED ORDER — METOPROLOL TARTRATE 12.5 MG HALF TABLET
12.5000 mg | ORAL_TABLET | Freq: Once | ORAL | Status: DC
  Filled 2019-10-01: qty 1

## 2019-10-01 MED ORDER — SODIUM CHLORIDE 0.9 % IV SOLN
1.5000 g | INTRAVENOUS | Status: AC
  Administered 2019-10-02: 1.5 g via INTRAVENOUS
  Filled 2019-10-01: qty 1.5

## 2019-10-01 MED ORDER — PLASMA-LYTE 148 IV SOLN
INTRAVENOUS | Status: DC
  Filled 2019-10-01: qty 2.5

## 2019-10-01 MED ORDER — SODIUM CHLORIDE 0.9 % IV SOLN
INTRAVENOUS | Status: DC
  Filled 2019-10-01: qty 30

## 2019-10-01 MED ORDER — VANCOMYCIN HCL 1250 MG/250ML IV SOLN
1250.0000 mg | INTRAVENOUS | Status: AC
  Administered 2019-10-02: 1250 mg via INTRAVENOUS
  Filled 2019-10-01: qty 250

## 2019-10-01 MED ORDER — CHLORHEXIDINE GLUCONATE CLOTH 2 % EX PADS
6.0000 | MEDICATED_PAD | Freq: Once | CUTANEOUS | Status: AC
  Administered 2019-10-02: 6 via TOPICAL

## 2019-10-01 MED ORDER — NITROGLYCERIN IN D5W 200-5 MCG/ML-% IV SOLN
2.0000 ug/min | INTRAVENOUS | Status: AC
  Administered 2019-10-02: 16.6 ug/min via INTRAVENOUS
  Filled 2019-10-01: qty 250

## 2019-10-01 MED ORDER — DEXMEDETOMIDINE HCL IN NACL 400 MCG/100ML IV SOLN
0.1000 ug/kg/h | INTRAVENOUS | Status: AC
  Administered 2019-10-02: .2 ug/kg/h via INTRAVENOUS
  Filled 2019-10-01: qty 100

## 2019-10-01 MED ORDER — CHLORHEXIDINE GLUCONATE 0.12 % MT SOLN
15.0000 mL | Freq: Once | OROMUCOSAL | Status: AC
  Administered 2019-10-02: 15 mL via OROMUCOSAL
  Filled 2019-10-01: qty 15

## 2019-10-01 MED ORDER — INSULIN REGULAR(HUMAN) IN NACL 100-0.9 UT/100ML-% IV SOLN
INTRAVENOUS | Status: AC
  Administered 2019-10-02: 1 [IU]/h via INTRAVENOUS
  Filled 2019-10-01: qty 100

## 2019-10-01 MED ORDER — SODIUM CHLORIDE 0.9 % IV SOLN
750.0000 mg | INTRAVENOUS | Status: AC
  Administered 2019-10-02: 750 mg via INTRAVENOUS
  Filled 2019-10-01: qty 750

## 2019-10-01 MED ORDER — TEMAZEPAM 15 MG PO CAPS
15.0000 mg | ORAL_CAPSULE | Freq: Once | ORAL | Status: AC | PRN
  Administered 2019-10-01: 15 mg via ORAL
  Filled 2019-10-01: qty 1

## 2019-10-01 MED ORDER — TRANEXAMIC ACID 1000 MG/10ML IV SOLN
1.5000 mg/kg/h | INTRAVENOUS | Status: AC
  Administered 2019-10-02: 1.5 mg/kg/h via INTRAVENOUS
  Filled 2019-10-01: qty 25

## 2019-10-01 MED ORDER — BISACODYL 5 MG PO TBEC: 5.0000 mg | DELAYED_RELEASE_TABLET | Freq: Once | ORAL | Status: DC

## 2019-10-01 MED ORDER — TRANEXAMIC ACID (OHS) PUMP PRIME SOLUTION
2.0000 mg/kg | INTRAVENOUS | Status: DC
  Filled 2019-10-01: qty 1.39

## 2019-10-01 MED ORDER — TRANEXAMIC ACID (OHS) BOLUS VIA INFUSION
15.0000 mg/kg | INTRAVENOUS | Status: AC
  Administered 2019-10-02: 1041 mg via INTRAVENOUS
  Filled 2019-10-01: qty 1041

## 2019-10-01 MED ORDER — POTASSIUM CHLORIDE 2 MEQ/ML IV SOLN
80.0000 meq | INTRAVENOUS | Status: DC
  Filled 2019-10-01: qty 40

## 2019-10-01 MED ORDER — NOREPINEPHRINE 4 MG/250ML-% IV SOLN
0.0000 ug/min | INTRAVENOUS | Status: DC
  Filled 2019-10-01: qty 250

## 2019-10-01 MED ORDER — CHLORHEXIDINE GLUCONATE CLOTH 2 % EX PADS
6.0000 | MEDICATED_PAD | Freq: Once | CUTANEOUS | Status: AC
  Administered 2019-10-01: 6 via TOPICAL

## 2019-10-01 MED ORDER — EPINEPHRINE HCL 5 MG/250ML IV SOLN IN NS
0.0000 ug/min | INTRAVENOUS | Status: DC
  Filled 2019-10-01: qty 250

## 2019-10-01 MED ORDER — MANNITOL 20 % IV SOLN
INTRAVENOUS | Status: DC
  Filled 2019-10-01: qty 13

## 2019-10-01 MED ORDER — MILRINONE LACTATE IN DEXTROSE 20-5 MG/100ML-% IV SOLN
0.3000 ug/kg/min | INTRAVENOUS | Status: DC
  Filled 2019-10-01: qty 100

## 2019-10-01 NOTE — Progress Notes (Signed)
[id number] Gave pt [facility] booklet and moving in the tube handout. Wrote down how to view pre op video. Gave IS and pt could get to 1500-1750 ml correctly. Discussed importance of mobility and IS after surgery. Discussed sternal precautions and staying in the tube. Pt has someone who can stay with her after discharge to assist with care. Pt voiced understanding of ed. Will follow up after surgery. Graylon Good RN BSN 10/01/2019 10:00 AM '

## 2019-10-01 NOTE — Progress Notes (Signed)
     [address]       [place] [address]             [phone]       No events Vitals:   10/01/19 1042 10/01/19 1626  BP: (!) 171/69 (!) 178/70  Pulse: 68 (!) 56  Resp: 14 13  Temp: 98 F (36.7 C) 98 F (36.7 C)  SpO2: 98% 97%   Alert NAD Sinus EWOB  69 yo female with LM CAD OR tomorrow for CABG [place]

## 2019-10-01 NOTE — Plan of Care (Signed)

## 2019-10-01 NOTE — Plan of Care (Signed)
  Problem: Education: Goal: Understanding of CV disease, CV risk reduction, and recovery process will improve Outcome: Progressing   Problem: Education: Goal: Individualized Educational Video(s) Outcome: Progressing   Problem: Cardiovascular: Goal: Vascular access site(s) Level 0-1 will be maintained Outcome: Progressing   Problem: Education: Goal: Knowledge of General Education information will improve Description: Including pain rating scale, medication(s)/side effects and non-pharmacologic comfort measures Outcome: Progressing   Problem: Nutrition: Goal: Adequate nutrition will be maintained Outcome: Progressing   Problem: Coping: Goal: Level of anxiety will decrease Outcome: Progressing   Problem: Pain Managment: Goal: General experience of comfort will improve Outcome: Progressing

## 2019-10-01 NOTE — Progress Notes (Signed)
Pre CABG has been completed.   Preliminary results in CV Proc.   Abram Sander 10/01/2019 9:16 AM

## 2019-10-01 NOTE — Telephone Encounter (Signed)
°  Pt wanted to speak with Dr. Julien Nordmann nurse. She said she is in the hospital and will have an emergency surgery tomorrow morning and would like to speak with them as soon as possible

## 2019-10-01 NOTE — Progress Notes (Signed)
Progress Note  Patient Name: Courtney Gardner Date of Encounter: 10/01/2019  Primary Cardiologist: Revankar  Subjective   No chest pain or dyspnea.   Inpatient Medications    Scheduled Meds: . aspirin EC  81 mg Oral Daily  . atorvastatin  80 mg Oral Daily  . levothyroxine  50 mcg Oral QAC breakfast  . metoprolol tartrate  25 mg Oral BID  . PARoxetine  10 mg Oral Daily  . sodium chloride flush  3 mL Intravenous Q12H   Continuous Infusions: . sodium chloride Stopped (10/01/19 0739)  . sodium chloride    . nitroGLYCERIN     PRN Meds: sodium chloride, acetaminophen, ALPRAZolam, cyclobenzaprine, diazepam, nitroGLYCERIN, ondansetron (ZOFRAN) IV, sodium chloride flush   Vital Signs    Vitals:   09/30/19 2349 10/01/19 0347 10/01/19 0739 10/01/19 0740  BP: 137/65 130/64  (!) 163/69  Pulse: (!) 53 (!) 55  (!) 50  Resp: 14 15  16   Temp: (!) 97.5 F (36.4 C) (!) 97.5 F (36.4 C) 97.8 F (36.6 C)   TempSrc: Oral Oral Oral   SpO2: 97% 99%  96%  Weight:      Height:        Intake/Output Summary (Last 24 hours) at 10/01/2019 0844 Last data filed at 09/30/2019 2349 Gross per 24 hour  Intake 391.78 ml  Output 600 ml  Net -208.22 ml   Last 3 Weights 09/30/2019 09/25/2019 09/11/2019  Weight (lbs) 153 lb 153 lb 156 lb  Weight (kg) 69.4 kg 69.4 kg 70.761 kg      Telemetry    Sinus brady - Personally Reviewed  ECG    Sinus brady - Personally Reviewed  Physical Exam   GEN: No acute distress.   Neck: No JVD Cardiac: RRR, no murmurs, rubs, or gallops.  Respiratory: Clear to auscultation bilaterally. GI: Soft, nontender, non-distended  MS: No edema; No deformity. Neuro:  Nonfocal  Psych: Normal affect   Labs    High Sensitivity Troponin:  No results for input(s): TROPONINIHS in the last 720 hours.    Chemistry Recent Labs  Lab 09/25/19 1534 10/01/19 0200  NA 139 141  K 4.2 3.8  CL 101 107  CO2 23 25  GLUCOSE 160* 113*  BUN 10 11  CREATININE 1.09* 1.01*    CALCIUM 9.8 9.3  GFRNONAA 52* 57*  GFRAA 60 >60  ANIONGAP  --  9     Hematology Recent Labs  Lab 10/01/19 0200  WBC 7.7  RBC 4.38  HGB 13.4  HCT 41.1  MCV 93.8  MCH 30.6  MCHC 32.6  RDW 12.9  PLT 259    BNPNo results for input(s): BNP, PROBNP in the last 168 hours.   DDimer No results for input(s): DDIMER in the last 168 hours.   Radiology    CARDIAC CATHETERIZATION  Result Date: 09/30/2019  Prox RCA lesion is 30% stenosed.  Ost LM to Dist LM lesion is 75% stenosed.  Mid Cx to Dist Cx lesion is 40% stenosed.  1st Mrg lesion is 65% stenosed.  Prox LAD lesion is 70% stenosed.  The left ventricular systolic function is normal.  LV end diastolic pressure is normal.  The left ventricular ejection fraction is 55-65% by visual estimate.  Significant coronary calcification involving the left main with catheter dampening with each engagement and stenosis of 75%, calcification of the proximal LAD with narrowing up to 70%; 70% stenoses in the circumflex marginal vessel with mild 40% mid stenosis and a dominant  left circumflex vessel and nondominant RCA with 30% proximal stenosis. Normal LV function with EF estimated at 60 to 65%.  LVEDP 14 mm. Definitely hypertensive throughout the catheterization; IV nitroglycerin was started at the end of the procedure. RECOMMENDATION: Angiograms were reviewed with colleagues who agreed with the angiographic significance of the left main.  Will admit patient to the hospital.  Titrate IV nitroglycerin. Adjunctive medical therapy for CAD.  Surgical consultation for CABG revascularization.  Aggressive lipid intervention.     Cardiac Studies   Echo 09/11/19: 1. Left ventricular ejection fraction, by estimation, is 50 to 55%. The  left ventricle has low normal function. The left ventricle has no regional  wall motion abnormalities. Left ventricular diastolic parameters are  indeterminate.  2. Right ventricular systolic function is normal. The right  ventricular  size is normal.  3. The mitral valve is normal in structure. Trivial mitral valve  regurgitation. No evidence of mitral stenosis.  4. The aortic valve is normal in structure. Aortic valve regurgitation is  not visualized. No aortic stenosis is present.  5. The inferior vena cava is normal in size with greater than 50%  respiratory variability, suggesting right atrial pressure of 3 mmHg.   Cardiac cath 6/12/1:  Prox RCA lesion is 30% stenosed.  Ost LM to Dist LM lesion is 75% stenosed.  Mid Cx to Dist Cx lesion is 40% stenosed.  1st Mrg lesion is 65% stenosed.  Prox LAD lesion is 70% stenosed.  The left ventricular systolic function is normal.  LV end diastolic pressure is normal.  The left ventricular ejection fraction is 55-65% by visual estimate.   Significant coronary calcification involving the left main with catheter dampening with each engagement and stenosis of 75%, calcification of the proximal LAD with narrowing up to 70%; 70% stenoses in the circumflex marginal vessel with mild 40% mid stenosis and a dominant left circumflex vessel and nondominant RCA with 30% proximal stenosis.  Normal LV function with EF estimated at 60 to 65%.  LVEDP 14 mm.  Diagnostic Dominance: Left    Patient Profile     69 y.o. female with history of former tobacco abuse, HTN, HLD and new finding of severe double vessel/LM CAD who was admitted after her cardiac cath 09/30/19. She has had recent chest pain c/w unstable angina. Cardiac cath 09/30/19 with severe left main stenosis as well as disease in the LAD and Circ/OM. CT surgery has been consulted for CABG  Assessment & Plan    1. CAD with unstable angina: Cardiac cath with severe left main/double vessel CAD. CT surgery consult for CABG. Will continue ASA, statin and beta blocker. Continue IV NTG.   For questions or updates, please contact [place] Please consult [email or web address] for contact info under         Signed, Lauree Chandler, MD  10/01/2019, 8:44 AM

## 2019-10-01 NOTE — Telephone Encounter (Signed)
Called and spoke with pt who wanted [place] to know that she would be having a CABG tomorrow. Dr. Geraldo Pitter was advised.

## 2019-10-02 ENCOUNTER — Inpatient Hospital Stay (HOSPITAL_COMMUNITY): Payer: Medicare Other

## 2019-10-02 ENCOUNTER — Inpatient Hospital Stay (HOSPITAL_COMMUNITY)
Admission: RE | Disposition: A | Discharge status: Home Health | Payer: Self-pay | Source: Home / Self Care | Attending: Thoracic Surgery (Cardiothoracic Vascular Surgery)

## 2019-10-02 HISTORY — PX: CORONARY ARTERY BYPASS GRAFT: SHX141

## 2019-10-02 HISTORY — PX: TEE WITHOUT CARDIOVERSION: SHX5443

## 2019-10-02 LAB — POCT I-STAT, CHEM 8
BUN: 13 mg/dL (ref 8–23)
BUN: 13 mg/dL (ref 8–23)
BUN: 11 mg/dL (ref 8–23)
BUN: 11 mg/dL (ref 8–23)
Calcium, Ion: 1.22 mmol/L (ref 1.15–1.40)
Calcium, Ion: 1.21 mmol/L (ref 1.15–1.40)
Calcium, Ion: 1 mmol/L — ABNORMAL LOW (ref 1.15–1.40)
Calcium, Ion: 1.24 mmol/L (ref 1.15–1.40)
Chloride: 104 mmol/L (ref 98–111)
Chloride: 104 mmol/L (ref 98–111)
Chloride: 102 mmol/L (ref 98–111)
Chloride: 105 mmol/L (ref 98–111)
Creatinine, Ser: 0.9 mg/dL (ref 0.44–1.00)
Creatinine, Ser: 1 mg/dL (ref 0.44–1.00)
Creatinine, Ser: 0.7 mg/dL (ref 0.44–1.00)
Creatinine, Ser: 0.7 mg/dL (ref 0.44–1.00)
Glucose, Bld: 146 mg/dL — ABNORMAL HIGH (ref 70–99)
Glucose, Bld: 119 mg/dL — ABNORMAL HIGH (ref 70–99)
Glucose, Bld: 94 mg/dL (ref 70–99)
Glucose, Bld: 116 mg/dL — ABNORMAL HIGH (ref 70–99)
HCT: 34 % — ABNORMAL LOW (ref 36.0–46.0)
HCT: 32 % — ABNORMAL LOW (ref 36.0–46.0)
HCT: 24 % — ABNORMAL LOW (ref 36.0–46.0)
HCT: 26 % — ABNORMAL LOW (ref 36.0–46.0)
Hemoglobin: 11.6 g/dL — ABNORMAL LOW (ref 12.0–15.0)
Hemoglobin: 10.9 g/dL — ABNORMAL LOW (ref 12.0–15.0)
Hemoglobin: 8.2 g/dL — ABNORMAL LOW (ref 12.0–15.0)
Hemoglobin: 8.8 g/dL — ABNORMAL LOW (ref 12.0–15.0)
Potassium: 3.5 mmol/L (ref 3.5–5.1)
Potassium: 3.4 mmol/L — ABNORMAL LOW (ref 3.5–5.1)
Potassium: 4.3 mmol/L (ref 3.5–5.1)
Potassium: 3.9 mmol/L (ref 3.5–5.1)
Sodium: 139 mmol/L (ref 135–145)
Sodium: 139 mmol/L (ref 135–145)
Sodium: 137 mmol/L (ref 135–145)
Sodium: 140 mmol/L (ref 135–145)
TCO2: 27 mmol/L (ref 22–32)
TCO2: 26 mmol/L (ref 22–32)
TCO2: 26 mmol/L (ref 22–32)
TCO2: 24 mmol/L (ref 22–32)

## 2019-10-02 LAB — COMPREHENSIVE METABOLIC PANEL
ALT: 15 U/L (ref 0–44)
AST: 30 U/L (ref 15–41)
Albumin: 3.4 g/dL — ABNORMAL LOW (ref 3.5–5.0)
Alkaline Phosphatase: 70 U/L (ref 38–126)
Anion gap: 10 (ref 5–15)
BUN: 16 mg/dL (ref 8–23)
CO2: 22 mmol/L (ref 22–32)
Calcium: 8.8 mg/dL — ABNORMAL LOW (ref 8.9–10.3)
Chloride: 107 mmol/L (ref 98–111)
Creatinine, Ser: 1.12 mg/dL — ABNORMAL HIGH (ref 0.44–1.00)
GFR calc Af Amer: 58 mL/min — ABNORMAL LOW (ref >60)
GFR calc non Af Amer: 50 mL/min — ABNORMAL LOW (ref >60)
Glucose, Bld: 120 mg/dL — ABNORMAL HIGH (ref 70–99)
Potassium: 4.3 mmol/L (ref 3.5–5.1)
Sodium: 139 mmol/L (ref 135–145)
Total Bilirubin: 1.7 mg/dL — ABNORMAL HIGH (ref 0.3–1.2)
Total Protein: 5.9 g/dL — ABNORMAL LOW (ref 6.5–8.1)

## 2019-10-02 LAB — POCT I-STAT 7, (LYTES, BLD GAS, ICA,H+H)
Acid-Base Excess: 1 mmol/L (ref 0.0–2.0)
Acid-Base Excess: 2 mmol/L (ref 0.0–2.0)
Acid-base deficit: 1 mmol/L (ref 0.0–2.0)
Acid-base deficit: 2 mmol/L (ref 0.0–2.0)
Acid-base deficit: 4 mmol/L — ABNORMAL HIGH (ref 0.0–2.0)
Acid-base deficit: 3 mmol/L — ABNORMAL HIGH (ref 0.0–2.0)
Acid-base deficit: 6 mmol/L — ABNORMAL HIGH (ref 0.0–2.0)
Acid-base deficit: 4 mmol/L — ABNORMAL HIGH (ref 0.0–2.0)
Bicarbonate: 25.2 mmol/L (ref 20.0–28.0)
Bicarbonate: 24.5 mmol/L (ref 20.0–28.0)
Bicarbonate: 25.5 mmol/L (ref 20.0–28.0)
Bicarbonate: 23.6 mmol/L (ref 20.0–28.0)
Bicarbonate: 20.9 mmol/L (ref 20.0–28.0)
Bicarbonate: 22.8 mmol/L (ref 20.0–28.0)
Bicarbonate: 20.6 mmol/L (ref 20.0–28.0)
Bicarbonate: 22.6 mmol/L (ref 20.0–28.0)
Calcium, Ion: 1.21 mmol/L (ref 1.15–1.40)
Calcium, Ion: 0.93 mmol/L — ABNORMAL LOW (ref 1.15–1.40)
Calcium, Ion: 1.01 mmol/L — ABNORMAL LOW (ref 1.15–1.40)
Calcium, Ion: 1.25 mmol/L (ref 1.15–1.40)
Calcium, Ion: 1.26 mmol/L (ref 1.15–1.40)
Calcium, Ion: 1.2 mmol/L (ref 1.15–1.40)
Calcium, Ion: 1.24 mmol/L (ref 1.15–1.40)
Calcium, Ion: 1.15 mmol/L (ref 1.15–1.40)
HCT: 35 % — ABNORMAL LOW (ref 36.0–46.0)
HCT: 24 % — ABNORMAL LOW (ref 36.0–46.0)
HCT: 23 % — ABNORMAL LOW (ref 36.0–46.0)
HCT: 24 % — ABNORMAL LOW (ref 36.0–46.0)
HCT: 30 % — ABNORMAL LOW (ref 36.0–46.0)
HCT: 24 % — ABNORMAL LOW (ref 36.0–46.0)
HCT: 24 % — ABNORMAL LOW (ref 36.0–46.0)
HCT: 23 % — ABNORMAL LOW (ref 36.0–46.0)
Hemoglobin: 11.9 g/dL — ABNORMAL LOW (ref 12.0–15.0)
Hemoglobin: 8.2 g/dL — ABNORMAL LOW (ref 12.0–15.0)
Hemoglobin: 7.8 g/dL — ABNORMAL LOW (ref 12.0–15.0)
Hemoglobin: 8.2 g/dL — ABNORMAL LOW (ref 12.0–15.0)
Hemoglobin: 10.2 g/dL — ABNORMAL LOW (ref 12.0–15.0)
Hemoglobin: 8.2 g/dL — ABNORMAL LOW (ref 12.0–15.0)
Hemoglobin: 8.2 g/dL — ABNORMAL LOW (ref 12.0–15.0)
Hemoglobin: 7.8 g/dL — ABNORMAL LOW (ref 12.0–15.0)
O2 Saturation: 100 %
O2 Saturation: 100 %
O2 Saturation: 100 %
O2 Saturation: 100 %
O2 Saturation: 95 %
O2 Saturation: 97 %
O2 Saturation: 98 %
O2 Saturation: 98 %
Patient temperature: 35.5
Patient temperature: 36.6
Patient temperature: 36.3
Patient temperature: 36.3
Potassium: 3.5 mmol/L (ref 3.5–5.1)
Potassium: 3.6 mmol/L (ref 3.5–5.1)
Potassium: 4.2 mmol/L (ref 3.5–5.1)
Potassium: 3.9 mmol/L (ref 3.5–5.1)
Potassium: 3.7 mmol/L (ref 3.5–5.1)
Potassium: 4.3 mmol/L (ref 3.5–5.1)
Potassium: 3.7 mmol/L (ref 3.5–5.1)
Potassium: 4.5 mmol/L (ref 3.5–5.1)
Sodium: 139 mmol/L (ref 135–145)
Sodium: 136 mmol/L (ref 135–145)
Sodium: 138 mmol/L (ref 135–145)
Sodium: 140 mmol/L (ref 135–145)
Sodium: 144 mmol/L (ref 135–145)
Sodium: 144 mmol/L (ref 135–145)
Sodium: 145 mmol/L (ref 135–145)
Sodium: 145 mmol/L (ref 135–145)
TCO2: 27 mmol/L (ref 22–32)
TCO2: 25 mmol/L (ref 22–32)
TCO2: 27 mmol/L (ref 22–32)
TCO2: 25 mmol/L (ref 22–32)
TCO2: 22 mmol/L (ref 22–32)
TCO2: 24 mmol/L (ref 22–32)
TCO2: 22 mmol/L (ref 22–32)
TCO2: 24 mmol/L (ref 22–32)
pCO2 arterial: 44.8 mmHg (ref 32.0–48.0)
pCO2 arterial: 33.7 mmHg (ref 32.0–48.0)
pCO2 arterial: 35.3 mmHg (ref 32.0–48.0)
pCO2 arterial: 44.3 mmHg (ref 32.0–48.0)
pCO2 arterial: 35.5 mmHg (ref 32.0–48.0)
pCO2 arterial: 43 mmHg (ref 32.0–48.0)
pCO2 arterial: 44.5 mmHg (ref 32.0–48.0)
pCO2 arterial: 44.2 mmHg (ref 32.0–48.0)
pH, Arterial: 7.357 (ref 7.350–7.450)
pH, Arterial: 7.469 — ABNORMAL HIGH (ref 7.350–7.450)
pH, Arterial: 7.467 — ABNORMAL HIGH (ref 7.350–7.450)
pH, Arterial: 7.335 — ABNORMAL LOW (ref 7.350–7.450)
pH, Arterial: 7.37 (ref 7.350–7.450)
pH, Arterial: 7.331 — ABNORMAL LOW (ref 7.350–7.450)
pH, Arterial: 7.269 — ABNORMAL LOW (ref 7.350–7.450)
pH, Arterial: 7.313 — ABNORMAL LOW (ref 7.350–7.450)
pO2, Arterial: 509 mmHg — ABNORMAL HIGH (ref 83.0–108.0)
pO2, Arterial: 416 mmHg — ABNORMAL HIGH (ref 83.0–108.0)
pO2, Arterial: 316 mmHg — ABNORMAL HIGH (ref 83.0–108.0)
pO2, Arterial: 238 mmHg — ABNORMAL HIGH (ref 83.0–108.0)
pO2, Arterial: 72 mmHg — ABNORMAL LOW (ref 83.0–108.0)
pO2, Arterial: 97 mmHg (ref 83.0–108.0)
pO2, Arterial: 125 mmHg — ABNORMAL HIGH (ref 83.0–108.0)
pO2, Arterial: 104 mmHg (ref 83.0–108.0)

## 2019-10-02 LAB — GLUCOSE, CAPILLARY
Glucose-Capillary: 118 mg/dL — ABNORMAL HIGH (ref 70–99)
Glucose-Capillary: 127 mg/dL — ABNORMAL HIGH (ref 70–99)
Glucose-Capillary: 119 mg/dL — ABNORMAL HIGH (ref 70–99)
Glucose-Capillary: 130 mg/dL — ABNORMAL HIGH (ref 70–99)
Glucose-Capillary: 144 mg/dL — ABNORMAL HIGH (ref 70–99)
Glucose-Capillary: 199 mg/dL — ABNORMAL HIGH (ref 70–99)
Glucose-Capillary: 135 mg/dL — ABNORMAL HIGH (ref 70–99)

## 2019-10-02 LAB — CBC
HCT: 39.7 % (ref 36.0–46.0)
HCT: 31.1 % — ABNORMAL LOW (ref 36.0–46.0)
HCT: 25.8 % — ABNORMAL LOW (ref 36.0–46.0)
HCT: 23.6 % — ABNORMAL LOW (ref 36.0–46.0)
Hemoglobin: 13.3 g/dL (ref 12.0–15.0)
Hemoglobin: 10.1 g/dL — ABNORMAL LOW (ref 12.0–15.0)
Hemoglobin: 8.5 g/dL — ABNORMAL LOW (ref 12.0–15.0)
Hemoglobin: 7.8 g/dL — ABNORMAL LOW (ref 12.0–15.0)
MCH: 31 pg (ref 26.0–34.0)
MCH: 30.5 pg (ref 26.0–34.0)
MCH: 31 pg (ref 26.0–34.0)
MCH: 31.5 pg (ref 26.0–34.0)
MCHC: 33.5 g/dL (ref 30.0–36.0)
MCHC: 32.5 g/dL (ref 30.0–36.0)
MCHC: 32.9 g/dL (ref 30.0–36.0)
MCHC: 33.1 g/dL (ref 30.0–36.0)
MCV: 92.5 fL (ref 80.0–100.0)
MCV: 94 fL (ref 80.0–100.0)
MCV: 94.2 fL (ref 80.0–100.0)
MCV: 95.2 fL (ref 80.0–100.0)
Platelets: 318 10*3/uL (ref 150–400)
Platelets: 152 10*3/uL (ref 150–400)
Platelets: 175 10*3/uL (ref 150–400)
Platelets: 169 10*3/uL (ref 150–400)
RBC: 4.29 MIL/uL (ref 3.87–5.11)
RBC: 3.31 MIL/uL — ABNORMAL LOW (ref 3.87–5.11)
RBC: 2.74 MIL/uL — ABNORMAL LOW (ref 3.87–5.11)
RBC: 2.48 MIL/uL — ABNORMAL LOW (ref 3.87–5.11)
RDW: 13 % (ref 11.5–15.5)
RDW: 12.9 % (ref 11.5–15.5)
RDW: 13 % (ref 11.5–15.5)
RDW: 13.1 % (ref 11.5–15.5)
WBC: 8 10*3/uL (ref 4.0–10.5)
WBC: 11.5 10*3/uL — ABNORMAL HIGH (ref 4.0–10.5)
WBC: 13.7 10*3/uL — ABNORMAL HIGH (ref 4.0–10.5)
WBC: 13.4 10*3/uL — ABNORMAL HIGH (ref 4.0–10.5)
nRBC: 0 % (ref 0.0–0.2)
nRBC: 0 % (ref 0.0–0.2)
nRBC: 0 % (ref 0.0–0.2)
nRBC: 0 % (ref 0.0–0.2)

## 2019-10-02 LAB — PROTIME-INR
INR: 1 (ref 0.8–1.2)
INR: 1.4 — ABNORMAL HIGH (ref 0.8–1.2)
INR: 1.3 — ABNORMAL HIGH (ref 0.8–1.2)
Prothrombin Time: 12.9 seconds (ref 11.4–15.2)
Prothrombin Time: 16.7 seconds — ABNORMAL HIGH (ref 11.4–15.2)
Prothrombin Time: 15.9 seconds — ABNORMAL HIGH (ref 11.4–15.2)

## 2019-10-02 LAB — BLOOD GAS, ARTERIAL
Acid-base deficit: 1.1 mmol/L (ref 0.0–2.0)
Bicarbonate: 23.3 mmol/L (ref 20.0–28.0)
FIO2: 21
O2 Saturation: 98 %
Patient temperature: 37
pCO2 arterial: 39.8 mmHg (ref 32.0–48.0)
pH, Arterial: 7.385 (ref 7.350–7.450)
pO2, Arterial: 101 mmHg (ref 83.0–108.0)

## 2019-10-02 LAB — URINALYSIS, ROUTINE W REFLEX MICROSCOPIC
Bilirubin Urine: NEGATIVE
Glucose, UA: NEGATIVE mg/dL
Hgb urine dipstick: NEGATIVE
Ketones, ur: 20 mg/dL — AB
Nitrite: NEGATIVE
Protein, ur: NEGATIVE mg/dL
Specific Gravity, Urine: 1.01 (ref 1.005–1.030)
pH: 5 (ref 5.0–8.0)

## 2019-10-02 LAB — BASIC METABOLIC PANEL
Anion gap: 14 (ref 5–15)
BUN: 11 mg/dL (ref 8–23)
CO2: 19 mmol/L — ABNORMAL LOW (ref 22–32)
Calcium: 8.4 mg/dL — ABNORMAL LOW (ref 8.9–10.3)
Chloride: 108 mmol/L (ref 98–111)
Creatinine, Ser: 1.04 mg/dL — ABNORMAL HIGH (ref 0.44–1.00)
GFR calc Af Amer: >60 mL/min (ref >60)
GFR calc non Af Amer: 55 mL/min — ABNORMAL LOW (ref >60)
Glucose, Bld: 131 mg/dL — ABNORMAL HIGH (ref 70–99)
Potassium: 4 mmol/L (ref 3.5–5.1)
Sodium: 141 mmol/L (ref 135–145)

## 2019-10-02 LAB — PREPARE RBC (CROSSMATCH)

## 2019-10-02 LAB — HEMOGLOBIN AND HEMATOCRIT, BLOOD
HCT: 24.8 % — ABNORMAL LOW (ref 36.0–46.0)
Hemoglobin: 8.4 g/dL — ABNORMAL LOW (ref 12.0–15.0)

## 2019-10-02 LAB — SURGICAL PCR SCREEN
MRSA, PCR: NEGATIVE
Staphylococcus aureus: NEGATIVE

## 2019-10-02 LAB — PLATELET COUNT: Platelets: 160 10*3/uL (ref 150–400)

## 2019-10-02 LAB — MAGNESIUM: Magnesium: 2.5 mg/dL — ABNORMAL HIGH (ref 1.7–2.4)

## 2019-10-02 LAB — APTT
aPTT: 31 seconds (ref 24–36)
aPTT: 38 seconds — ABNORMAL HIGH (ref 24–36)

## 2019-10-02 LAB — FIBRINOGEN: Fibrinogen: 437 mg/dL (ref 210–475)

## 2019-10-02 SURGERY — CORONARY ARTERY BYPASS GRAFTING (CABG)
Anesthesia: General | Site: Chest

## 2019-10-02 MED ORDER — FENTANYL CITRATE (PF) 250 MCG/5ML IJ SOLN
INTRAMUSCULAR | Status: AC
  Filled 2019-10-02: qty 5

## 2019-10-02 MED ORDER — ALBUMIN HUMAN 5 % IV SOLN: INTRAVENOUS | Status: DC | PRN

## 2019-10-02 MED ORDER — 0.9 % SODIUM CHLORIDE (POUR BTL) OPTIME
TOPICAL | Status: DC | PRN
  Administered 2019-10-02: 5000 mL

## 2019-10-02 MED ORDER — MAGNESIUM SULFATE 4 GM/100ML IV SOLN
4.0000 g | Freq: Once | INTRAVENOUS | Status: AC
  Administered 2019-10-02: 4 g via INTRAVENOUS
  Filled 2019-10-02: qty 100

## 2019-10-02 MED ORDER — PANTOPRAZOLE SODIUM 40 MG PO TBEC
40.0000 mg | DELAYED_RELEASE_TABLET | Freq: Every day | ORAL | Status: DC
  Administered 2019-10-04 – 2019-10-08 (×5): 40 mg via ORAL
  Filled 2019-10-02 (×5): qty 1

## 2019-10-02 MED ORDER — BISACODYL 5 MG PO TBEC
10.0000 mg | DELAYED_RELEASE_TABLET | Freq: Every day | ORAL | Status: DC
  Administered 2019-10-04 – 2019-10-06 (×3): 10 mg via ORAL
  Filled 2019-10-02 (×5): qty 2

## 2019-10-02 MED ORDER — ACETAMINOPHEN 500 MG PO TABS
1000.0000 mg | ORAL_TABLET | Freq: Four times a day (QID) | ORAL | Status: AC
  Administered 2019-10-02 – 2019-10-07 (×10): 1000 mg via ORAL
  Filled 2019-10-02 (×11): qty 2

## 2019-10-02 MED ORDER — ALBUMIN HUMAN 5 % IV SOLN
250.0000 mL | INTRAVENOUS | Status: AC | PRN
  Administered 2019-10-02 (×4): 12.5 g via INTRAVENOUS
  Filled 2019-10-02: qty 250

## 2019-10-02 MED ORDER — OXYCODONE HCL 5 MG PO TABS
5.0000 mg | ORAL_TABLET | ORAL | Status: DC | PRN
  Administered 2019-10-03: 10 mg via ORAL
  Filled 2019-10-02: qty 2

## 2019-10-02 MED ORDER — DOCUSATE SODIUM 100 MG PO CAPS
200.0000 mg | ORAL_CAPSULE | Freq: Every day | ORAL | Status: DC
  Administered 2019-10-04 – 2019-10-06 (×3): 200 mg via ORAL
  Filled 2019-10-02 (×5): qty 2

## 2019-10-02 MED ORDER — FENTANYL CITRATE (PF) 250 MCG/5ML IJ SOLN
INTRAMUSCULAR | Status: AC
  Filled 2019-10-02: qty 25

## 2019-10-02 MED ORDER — HEPARIN SODIUM (PORCINE) 1000 UNIT/ML IJ SOLN
INTRAMUSCULAR | Status: DC | PRN
  Administered 2019-10-02: 24000 [IU] via INTRAVENOUS

## 2019-10-02 MED ORDER — NOREPINEPHRINE 4 MG/250ML-% IV SOLN: 0.0000 ug/min | INTRAVENOUS | Status: DC

## 2019-10-02 MED ORDER — FAMOTIDINE IN NACL 20-0.9 MG/50ML-% IV SOLN
20.0000 mg | Freq: Two times a day (BID) | INTRAVENOUS | Status: AC
  Administered 2019-10-02: 20 mg via INTRAVENOUS

## 2019-10-02 MED ORDER — METOPROLOL TARTRATE 5 MG/5ML IV SOLN: 2.5000 mg | INTRAVENOUS | Status: DC | PRN

## 2019-10-02 MED ORDER — SODIUM CHLORIDE 0.9% FLUSH: 3.0000 mL | INTRAVENOUS | Status: DC | PRN

## 2019-10-02 MED ORDER — METOPROLOL TARTRATE 12.5 MG HALF TABLET
12.5000 mg | ORAL_TABLET | Freq: Two times a day (BID) | ORAL | Status: DC
  Administered 2019-10-03: 12.5 mg via ORAL
  Filled 2019-10-02 (×2): qty 1

## 2019-10-02 MED ORDER — ASPIRIN EC 325 MG PO TBEC
325.0000 mg | DELAYED_RELEASE_TABLET | Freq: Every day | ORAL | Status: DC
  Administered 2019-10-04 – 2019-10-08 (×5): 325 mg via ORAL
  Filled 2019-10-02 (×5): qty 1

## 2019-10-02 MED ORDER — LACTATED RINGERS IV SOLN: INTRAVENOUS | Status: DC

## 2019-10-02 MED ORDER — SODIUM CHLORIDE 0.9% FLUSH
3.0000 mL | Freq: Two times a day (BID) | INTRAVENOUS | Status: DC
  Administered 2019-10-03 – 2019-10-08 (×8): 3 mL via INTRAVENOUS

## 2019-10-02 MED ORDER — FENTANYL CITRATE (PF) 250 MCG/5ML IJ SOLN
INTRAMUSCULAR | Status: DC | PRN
  Administered 2019-10-02: 200 ug via INTRAVENOUS
  Administered 2019-10-02: 400 ug via INTRAVENOUS
  Administered 2019-10-02: 100 ug via INTRAVENOUS
  Administered 2019-10-02: 250 ug via INTRAVENOUS
  Administered 2019-10-02: 100 ug via INTRAVENOUS
  Administered 2019-10-02: 25 ug via INTRAVENOUS
  Administered 2019-10-02: 50 ug via INTRAVENOUS
  Administered 2019-10-02: 100 ug via INTRAVENOUS
  Administered 2019-10-02: 25 ug via INTRAVENOUS
  Administered 2019-10-02: 150 ug via INTRAVENOUS
  Administered 2019-10-02: 250 ug via INTRAVENOUS
  Administered 2019-10-02: 50 ug via INTRAVENOUS
  Administered 2019-10-02: 250 ug via INTRAVENOUS

## 2019-10-02 MED ORDER — MIDAZOLAM HCL 2 MG/2ML IJ SOLN: 2.0000 mg | INTRAMUSCULAR | Status: DC | PRN

## 2019-10-02 MED ORDER — NICARDIPINE HCL IN NACL 20-0.86 MG/200ML-% IV SOLN
3.0000 mg/h | INTRAVENOUS | Status: DC
  Administered 2019-10-02 – 2019-10-03 (×2): 5 mg/h via INTRAVENOUS
  Administered 2019-10-03 (×2): 9 mg/h via INTRAVENOUS
  Administered 2019-10-04: 3 mg/h via INTRAVENOUS
  Filled 2019-10-02 (×8): qty 200

## 2019-10-02 MED ORDER — SODIUM CHLORIDE 0.45 % IV SOLN: INTRAVENOUS | Status: DC | PRN

## 2019-10-02 MED ORDER — TRAMADOL HCL 50 MG PO TABS: 50.0000 mg | ORAL_TABLET | ORAL | Status: DC | PRN

## 2019-10-02 MED ORDER — SODIUM CHLORIDE 0.9 % IV SOLN
1.5000 g | Freq: Two times a day (BID) | INTRAVENOUS | Status: AC
  Administered 2019-10-02 – 2019-10-04 (×4): 1.5 g via INTRAVENOUS
  Filled 2019-10-02 (×4): qty 1.5

## 2019-10-02 MED ORDER — EPINEPHRINE PF 1 MG/ML IJ SOLN
0.0000 ug/min | INTRAVENOUS | Status: DC
  Filled 2019-10-02: qty 4

## 2019-10-02 MED ORDER — METOPROLOL TARTRATE 25 MG/10 ML ORAL SUSPENSION: 12.5000 mg | Freq: Two times a day (BID) | ORAL | Status: DC

## 2019-10-02 MED ORDER — MIDAZOLAM HCL (PF) 10 MG/2ML IJ SOLN
INTRAMUSCULAR | Status: AC
  Filled 2019-10-02: qty 2

## 2019-10-02 MED ORDER — ARTIFICIAL TEARS OPHTHALMIC OINT
TOPICAL_OINTMENT | OPHTHALMIC | Status: AC
  Filled 2019-10-02: qty 3.5

## 2019-10-02 MED ORDER — DEXTROSE 50 % IV SOLN: 0.0000 mL | INTRAVENOUS | Status: DC | PRN

## 2019-10-02 MED ORDER — MORPHINE SULFATE (PF) 2 MG/ML IV SOLN
1.0000 mg | INTRAVENOUS | Status: DC | PRN
  Administered 2019-10-02 – 2019-10-04 (×5): 2 mg via INTRAVENOUS
  Filled 2019-10-02 (×5): qty 1

## 2019-10-02 MED ORDER — ONDANSETRON HCL 4 MG/2ML IJ SOLN
4.0000 mg | Freq: Four times a day (QID) | INTRAMUSCULAR | Status: DC | PRN
  Administered 2019-10-02 – 2019-10-04 (×3): 4 mg via INTRAVENOUS
  Filled 2019-10-02 (×3): qty 2

## 2019-10-02 MED ORDER — PHENYLEPHRINE HCL-NACL 20-0.9 MG/250ML-% IV SOLN
0.0000 ug/min | INTRAVENOUS | Status: DC
  Administered 2019-10-02: 10 ug/min via INTRAVENOUS

## 2019-10-02 MED ORDER — LACTATED RINGERS IV SOLN: 500.0000 mL | Freq: Once | INTRAVENOUS | Status: DC | PRN

## 2019-10-02 MED ORDER — VANCOMYCIN HCL IN DEXTROSE 1-5 GM/200ML-% IV SOLN
1000.0000 mg | Freq: Once | INTRAVENOUS | Status: AC
  Administered 2019-10-02: 1000 mg via INTRAVENOUS
  Filled 2019-10-02: qty 200

## 2019-10-02 MED ORDER — INSULIN REGULAR(HUMAN) IN NACL 100-0.9 UT/100ML-% IV SOLN: INTRAVENOUS | Status: DC

## 2019-10-02 MED ORDER — PROTAMINE SULFATE 10 MG/ML IV SOLN
INTRAVENOUS | Status: DC | PRN
  Administered 2019-10-02 (×4): 40 mg via INTRAVENOUS
  Administered 2019-10-02: 50 mg via INTRAVENOUS
  Administered 2019-10-02: 30 mg via INTRAVENOUS

## 2019-10-02 MED ORDER — SODIUM CHLORIDE 0.9% IV SOLUTION: Freq: Once | INTRAVENOUS | Status: AC

## 2019-10-02 MED ORDER — MILRINONE LACTATE IN DEXTROSE 20-5 MG/100ML-% IV SOLN: 0.3000 ug/kg/min | INTRAVENOUS | Status: DC

## 2019-10-02 MED ORDER — SODIUM CHLORIDE (PF) 0.9 % IJ SOLN
OROMUCOSAL | Status: DC | PRN
  Administered 2019-10-02 (×2): 4 mL via TOPICAL

## 2019-10-02 MED ORDER — NITROGLYCERIN IN D5W 200-5 MCG/ML-% IV SOLN: 0.0000 ug/min | INTRAVENOUS | Status: DC

## 2019-10-02 MED ORDER — BISACODYL 10 MG RE SUPP: 10.0000 mg | Freq: Every day | RECTAL | Status: DC

## 2019-10-02 MED ORDER — PROPOFOL 10 MG/ML IV BOLUS
INTRAVENOUS | Status: DC | PRN
  Administered 2019-10-02: 70 mg via INTRAVENOUS

## 2019-10-02 MED ORDER — ACETAMINOPHEN 160 MG/5ML PO SOLN: 650.0000 mg | Freq: Once | ORAL | Status: AC

## 2019-10-02 MED ORDER — CHLORHEXIDINE GLUCONATE CLOTH 2 % EX PADS
6.0000 | MEDICATED_PAD | Freq: Every day | CUTANEOUS | Status: DC
  Administered 2019-10-02 – 2019-10-05 (×3): 6 via TOPICAL

## 2019-10-02 MED ORDER — LIDOCAINE 2% (20 MG/ML) 5 ML SYRINGE
INTRAMUSCULAR | Status: DC | PRN
  Administered 2019-10-02: 60 mg via INTRAVENOUS

## 2019-10-02 MED ORDER — MIDAZOLAM HCL 5 MG/5ML IJ SOLN
INTRAMUSCULAR | Status: DC | PRN
  Administered 2019-10-02: 4 mg via INTRAVENOUS
  Administered 2019-10-02: 1 mg via INTRAVENOUS
  Administered 2019-10-02: 2 mg via INTRAVENOUS
  Administered 2019-10-02 (×3): 1 mg via INTRAVENOUS

## 2019-10-02 MED ORDER — FENTANYL CITRATE (PF) 250 MCG/5ML IJ SOLN
INTRAMUSCULAR | Status: AC
  Filled 2019-10-02: qty 10

## 2019-10-02 MED ORDER — SODIUM CHLORIDE 0.9 % IV SOLN: 250.0000 mL | INTRAVENOUS | Status: DC

## 2019-10-02 MED ORDER — LACTATED RINGERS IV SOLN: INTRAVENOUS | Status: DC | PRN

## 2019-10-02 MED ORDER — PROPOFOL 10 MG/ML IV BOLUS
INTRAVENOUS | Status: AC
  Filled 2019-10-02: qty 20

## 2019-10-02 MED ORDER — ONDANSETRON HCL 4 MG/2ML IJ SOLN
INTRAMUSCULAR | Status: DC | PRN
  Administered 2019-10-02: 4 mg via INTRAVENOUS

## 2019-10-02 MED ORDER — ACETAMINOPHEN 650 MG RE SUPP
650.0000 mg | Freq: Once | RECTAL | Status: AC
  Administered 2019-10-02: 650 mg via RECTAL

## 2019-10-02 MED ORDER — ASPIRIN 81 MG PO CHEW: 324.0000 mg | CHEWABLE_TABLET | Freq: Every day | ORAL | Status: DC

## 2019-10-02 MED ORDER — POTASSIUM CHLORIDE 10 MEQ/50ML IV SOLN
10.0000 meq | INTRAVENOUS | Status: AC
  Administered 2019-10-02 (×3): 10 meq via INTRAVENOUS

## 2019-10-02 MED ORDER — EPHEDRINE 5 MG/ML INJ
INTRAVENOUS | Status: AC
  Filled 2019-10-02: qty 10

## 2019-10-02 MED ORDER — CHLORHEXIDINE GLUCONATE 0.12 % MT SOLN
15.0000 mL | OROMUCOSAL | Status: AC
  Administered 2019-10-02: 15 mL via OROMUCOSAL

## 2019-10-02 MED ORDER — ROCURONIUM BROMIDE 10 MG/ML (PF) SYRINGE
PREFILLED_SYRINGE | INTRAVENOUS | Status: DC | PRN
  Administered 2019-10-02: 50 mg via INTRAVENOUS
  Administered 2019-10-02: 60 mg via INTRAVENOUS
  Administered 2019-10-02: 40 mg via INTRAVENOUS

## 2019-10-02 MED ORDER — ORAL CARE MOUTH RINSE
15.0000 mL | Freq: Two times a day (BID) | OROMUCOSAL | Status: DC
  Administered 2019-10-02: 15 mL via OROMUCOSAL

## 2019-10-02 MED ORDER — DEXMEDETOMIDINE HCL IN NACL 400 MCG/100ML IV SOLN: 0.0000 ug/kg/h | INTRAVENOUS | Status: DC

## 2019-10-02 MED ORDER — EPHEDRINE SULFATE 50 MG/ML IJ SOLN
INTRAMUSCULAR | Status: DC | PRN
  Administered 2019-10-02 (×3): 5 mg via INTRAVENOUS
  Administered 2019-10-02: 2.5 mg via INTRAVENOUS

## 2019-10-02 MED ORDER — SODIUM CHLORIDE 0.9 % IV SOLN: INTRAVENOUS | Status: DC

## 2019-10-02 MED ORDER — PLASMA-LYTE 148 IV SOLN
INTRAVENOUS | Status: DC | PRN
  Administered 2019-10-02: 500 mL via INTRAVASCULAR

## 2019-10-02 MED ORDER — ACETAMINOPHEN 160 MG/5ML PO SOLN: 1000.0000 mg | Freq: Four times a day (QID) | ORAL | Status: AC

## 2019-10-02 MED ORDER — HEPARIN SODIUM (PORCINE) 1000 UNIT/ML IJ SOLN
INTRAMUSCULAR | Status: AC
  Filled 2019-10-02: qty 1

## 2019-10-02 SURGICAL SUPPLY — 90 items
ADH SKN CLS APL DERMABOND .7 (GAUZE/BANDAGES/DRESSINGS) ×4
BAG DECANTER FOR FLEXI CONT (MISCELLANEOUS) ×3 IMPLANT
BLADE CLIPPER SURG (BLADE) ×2 IMPLANT
BLADE STERNUM SYSTEM 6 (BLADE) ×3 IMPLANT
BLADE SURG 11 STRL SS (BLADE) ×1 IMPLANT
BNDG ELASTIC 4X5.8 VLCR STR LF (GAUZE/BANDAGES/DRESSINGS) ×3 IMPLANT
BNDG ELASTIC 6X5.8 VLCR STR LF (GAUZE/BANDAGES/DRESSINGS) ×3 IMPLANT
BNDG GAUZE ELAST 4 BULKY (GAUZE/BANDAGES/DRESSINGS) ×3 IMPLANT
CABLE SURGICAL S-101-97-12 (CABLE) ×3 IMPLANT
CANISTER SUCT 3000ML PPV (MISCELLANEOUS) ×3 IMPLANT
CANNULA EZ GLIDE 8.0 24FR (CANNULA) ×2 IMPLANT
CANNULA MC2 2 STG 29/37 NON-V (CANNULA) ×2 IMPLANT
CANNULA MC2 TWO STAGE (CANNULA) ×3
CANNULA NON VENT 20FR 12 (CANNULA) ×1 IMPLANT
CATH ROBINSON RED A/P 18FR (CATHETERS) ×6 IMPLANT
CLIP RETRACTION 3.0MM CORONARY (MISCELLANEOUS) ×2 IMPLANT
CLIP VESOCCLUDE MED 24/CT (CLIP) IMPLANT
CLIP VESOCCLUDE SM WIDE 24/CT (CLIP) ×1 IMPLANT
CONN ST 1/2X1/2  BEN (MISCELLANEOUS)
CONN ST 1/2X1/2 BEN (MISCELLANEOUS) ×2 IMPLANT
CONNECTOR BLAKE 2:1 CARIO BLK (MISCELLANEOUS) ×3 IMPLANT
DEFOGGER ANTIFOG KIT (MISCELLANEOUS) ×1 IMPLANT
DERMABOND ADVANCED (GAUZE/BANDAGES/DRESSINGS) ×2
DERMABOND ADVANCED .7 DNX12 (GAUZE/BANDAGES/DRESSINGS) IMPLANT
DRAIN CHANNEL 19F RND (DRAIN) ×9 IMPLANT
DRAIN CONNECTOR BLAKE 1:1 (MISCELLANEOUS) ×4 IMPLANT
DRAPE CARDIOVASCULAR INCISE (DRAPES) ×3
DRAPE INCISE IOBAN 66X45 STRL (DRAPES) ×1 IMPLANT
DRAPE SLUSH/WARMER DISC (DRAPES) ×3 IMPLANT
DRAPE SRG 135X102X78XABS (DRAPES) ×2 IMPLANT
DRSG COVADERM 4X14 (GAUZE/BANDAGES/DRESSINGS) ×3 IMPLANT
ELECT BLADE 4.0 EZ CLEAN MEGAD (MISCELLANEOUS) ×3
ELECT REM PT RETURN 9FT ADLT (ELECTROSURGICAL) ×6
ELECTRODE BLDE 4.0 EZ CLN MEGD (MISCELLANEOUS) IMPLANT
ELECTRODE REM PT RTRN 9FT ADLT (ELECTROSURGICAL) ×4 IMPLANT
FELT TEFLON 1X6 (MISCELLANEOUS) ×5 IMPLANT
GAUZE SPONGE 4X4 12PLY STRL (GAUZE/BANDAGES/DRESSINGS) ×6 IMPLANT
GLOVE BIO SURGEON STRL SZ 6 (GLOVE) ×1 IMPLANT
GLOVE BIO SURGEON STRL SZ 6.5 (GLOVE) ×5 IMPLANT
GLOVE BIO SURGEON STRL SZ7 (GLOVE) ×6 IMPLANT
GLOVE BIO SURGEON STRL SZ8 (GLOVE) ×1 IMPLANT
GLOVE BIOGEL M STRL SZ7.5 (GLOVE) ×6 IMPLANT
GLOVE BIOGEL PI IND STRL 6 (GLOVE) IMPLANT
GLOVE BIOGEL PI IND STRL 6.5 (GLOVE) IMPLANT
GLOVE BIOGEL PI INDICATOR 6 (GLOVE) ×1
GLOVE BIOGEL PI INDICATOR 6.5 (GLOVE) ×4
GOWN STRL REUS W/ TWL LRG LVL3 (GOWN DISPOSABLE) ×8 IMPLANT
GOWN STRL REUS W/ TWL XL LVL3 (GOWN DISPOSABLE) ×4 IMPLANT
GOWN STRL REUS W/TWL LRG LVL3 (GOWN DISPOSABLE) ×18
GOWN STRL REUS W/TWL XL LVL3 (GOWN DISPOSABLE) ×6
HEMOSTAT POWDER SURGIFOAM 1G (HEMOSTASIS) ×8 IMPLANT
KIT BASIN OR (CUSTOM PROCEDURE TRAY) ×3 IMPLANT
KIT DRAINAGE VACCUM ASSIST (KITS) ×1 IMPLANT
KIT SUCTION CATH 14FR (SUCTIONS) ×3 IMPLANT
KIT TURNOVER KIT B (KITS) ×3 IMPLANT
KIT VASOVIEW HEMOPRO 2 VH 4000 (KITS) ×3 IMPLANT
LEAD PACING MYOCARDI (MISCELLANEOUS) ×2 IMPLANT
MARKER GRAFT CORONARY BYPASS (MISCELLANEOUS) ×7 IMPLANT
NS IRRIG 1000ML POUR BTL (IV SOLUTION) ×15 IMPLANT
PACK ACCESSORY CANNULA KIT (KITS) ×3 IMPLANT
PACK E OPEN HEART (SUTURE) ×3 IMPLANT
PACK OPEN HEART (CUSTOM PROCEDURE TRAY) ×3 IMPLANT
PAD ARMBOARD 7.5X6 YLW CONV (MISCELLANEOUS) ×6 IMPLANT
PAD ELECT DEFIB RADIOL ZOLL (MISCELLANEOUS) ×3 IMPLANT
PENCIL BUTTON HOLSTER BLD 10FT (ELECTRODE) ×3 IMPLANT
POSITIONER HEAD DONUT 9IN (MISCELLANEOUS) ×3 IMPLANT
PUNCH AORTIC ROTATE 4.0MM (MISCELLANEOUS) ×3 IMPLANT
SET CARDIOPLEGIA MPS 5001102 (MISCELLANEOUS) ×1 IMPLANT
SUPPORT HEART JANKE-BARRON (MISCELLANEOUS) ×3 IMPLANT
SUT BONE WAX W31G (SUTURE) ×3 IMPLANT
SUT ETHIBOND X763 2 0 SH 1 (SUTURE) ×7 IMPLANT
SUT MNCRL AB 3-0 PS2 18 (SUTURE) ×6 IMPLANT
SUT MNCRL AB 4-0 PS2 18 (SUTURE) ×2 IMPLANT
SUT PDS AB 1 CTX 36 (SUTURE) ×6 IMPLANT
SUT PROLENE 4 0 SH DA (SUTURE) ×3 IMPLANT
SUT PROLENE 5 0 C 1 36 (SUTURE) ×9 IMPLANT
SUT PROLENE 7 0 BV 1 (SUTURE) ×1 IMPLANT
SUT PROLENE 7 0 BV1 MDA (SUTURE) ×3 IMPLANT
SUT STEEL 6MS V (SUTURE) ×6 IMPLANT
SUT VIC AB 2-0 CT1 27 (SUTURE) ×6
SUT VIC AB 2-0 CT1 TAPERPNT 27 (SUTURE) IMPLANT
SYSTEM SAHARA CHEST DRAIN ATS (WOUND CARE) ×3 IMPLANT
TAPE CLOTH SURG 4X10 WHT LF (GAUZE/BANDAGES/DRESSINGS) ×1 IMPLANT
TAPE PAPER 2X10 WHT MICROPORE (GAUZE/BANDAGES/DRESSINGS) ×1 IMPLANT
TOWEL GREEN STERILE (TOWEL DISPOSABLE) ×3 IMPLANT
TOWEL GREEN STERILE FF (TOWEL DISPOSABLE) ×3 IMPLANT
TRAY FOLEY SLVR 16FR TEMP STAT (SET/KITS/TRAYS/PACK) ×3 IMPLANT
TUBING LAP HI FLOW INSUFFLATIO (TUBING) ×3 IMPLANT
UNDERPAD 30X36 HEAVY ABSORB (UNDERPADS AND DIAPERS) ×3 IMPLANT
WATER STERILE IRR 1000ML POUR (IV SOLUTION) ×6 IMPLANT

## 2019-10-02 NOTE — Progress Notes (Signed)
Patient ID: [name], female   DOB: 15-Jun-1950, 69 y.o.   MRN: [id number] EVENING ROUNDS NOTE :     [address]       [place] [address]             [phone]                 Day of Surgery Procedure(s) (LRB): CORONARY ARTERY BYPASS GRAFTING (CABG) TIMES TWO, ON PUMP, USING LEFT INTERNAL MAMMARY ARTERY AND RIGHT GREATER SAPHENOUS VEIN HARVESTED ENDOSCOPICALLY (N/A) TRANSESOPHAGEAL ECHOCARDIOGRAM (TEE) (N/A)  Total Length of Stay:  LOS: 2 days  BP 130/61   Pulse 76   Temp 97.7 F (36.5 C)   Resp 16   Ht 5\' 6"  (1.676 m)   Wt 68 kg   SpO2 100%   BMI 24.20 kg/m   .Intake/Output      06/03 0701 - 06/04 0700   P.O.    I.V. (mL/kg) 2344.6 (34.5)   IV Piggyback 1749.1   Total Intake(mL/kg) 4093.7 (60.2)   Urine (mL/kg/hr) 1960 (2.4)   Blood 650   Chest Tube 680   Total Output 3290   Net +803.7         . sodium chloride 10 mL/hr at 10/02/19 1900  . [START ON 10/03/2019] sodium chloride    . sodium chloride    . albumin human Stopped (10/02/19 1727)  . cefUROXime (ZINACEF)  IV    . dexmedetomidine (PRECEDEX) IV infusion Stopped (10/02/19 1804)  . EPINEPHrine 4 mg in dextrose 5% 250 mL infusion (16 mcg/mL) Stopped (10/02/19 1430)  . famotidine (PEPCID) IV Stopped (10/02/19 1812)  . insulin 1.3 mL/hr at 10/02/19 1900  . lactated ringers    . lactated ringers 20 mL/hr at 10/02/19 1900  . lactated ringers Stopped (10/02/19 1452)  . milrinone    . niCARDipine 2.5 mg/hr (10/02/19 1900)  . nitroGLYCERIN 25 mcg/min (10/02/19 0539)  . nitroGLYCERIN Stopped (10/02/19 1415)  . norepinephrine (LEVOPHED) Adult infusion Stopped (10/02/19 1431)  . phenylephrine (NEO-SYNEPHRINE) Adult infusion Stopped (10/02/19 1727)  . vancomycin       Lab Results  Component Value Date   WBC 11.5 (H) 10/02/2019   HGB 10.1 (L) 10/02/2019   HCT 31.1 (L) 10/02/2019   PLT 152 10/02/2019   GLUCOSE 116 (H) 10/02/2019   CHOL 138 09/11/2019   TRIG 73 09/11/2019   HDL 68  09/11/2019   LDLCALC 56 09/11/2019   ALT 15 10/02/2019   AST 30 10/02/2019   NA 140 10/02/2019   K 3.9 10/02/2019   CL 105 10/02/2019   CREATININE 0.70 10/02/2019   BUN 11 10/02/2019   CO2 22 10/02/2019   TSH 1.700 09/11/2019   INR 1.4 (H) 10/02/2019   HGBA1C 7.7 (H) 10/01/2019   cabg today  extubated at 6:30 Neuro intatc ci 3.2 800 ml total ct output over 6 hours , appears slowing now plt [place] MD  Beeper [phone] Office [phone] 10/02/2019 7:03 PM

## 2019-10-02 NOTE — Brief Op Note (Signed)
09/30/2019 - 10/02/2019  11:53 AM  PATIENT:  Courtney Gardner  69 y.o. female  PRE-OPERATIVE DIAGNOSIS:  CORONARY ARTERY DISEASE LEFT MAIN DISEASE  POST-OPERATIVE DIAGNOSIS:  CORONARY ARTERY DISEASE LEFT MAIN DISEASE  PROCEDURE:  Procedure(s):  CORONARY ARTERY BYPASS GRAFTING x 2 -LIMA to LAD -SVG to OM 1  ENDOSCOPIC HARVEST GREATER SAPHENOUS VEIN -Right Leg  TRANSESOPHAGEAL ECHOCARDIOGRAM (TEE) (N/A)  SURGEON:  Surgeon(s) and Role:    * Lightfoot, Lucile Crater, MD - Primary  PHYSICIAN ASSISTANT: Raymond Bhardwaj PA-C  ANESTHESIA:   general  EBL:  650 mL   BLOOD ADMINISTERED: CELLSAVER  DRAINS: Left Pleural Chest Tube, Mediastinal Chest Drains   LOCAL MEDICATIONS USED:  NONE  SPECIMEN:  No Specimen  DISPOSITION OF SPECIMEN:  N/A  COUNTS:  YES  TOURNIQUET:  * No tourniquets in log *  DICTATION: .Dragon Dictation  PLAN OF CARE: Admit to inpatient   PATIENT DISPOSITION:  ICU - intubated and hemodynamically stable.   Delay start of Pharmacological VTE agent (>24hrs) due to surgical blood loss or risk of bleeding: yes

## 2019-10-02 NOTE — Transfer of Care (Signed)
Immediate Anesthesia Transfer of Care Note  Patient: Courtney Gardner  Procedure(s) Performed: CORONARY ARTERY BYPASS GRAFTING (CABG) TIMES TWO, ON PUMP, USING LEFT INTERNAL MAMMARY ARTERY AND RIGHT GREATER SAPHENOUS VEIN HARVESTED ENDOSCOPICALLY (N/A Chest) TRANSESOPHAGEAL ECHOCARDIOGRAM (TEE) (N/A )  Patient Location: ICU  Anesthesia Type:General  Level of Consciousness: Patient remains intubated per anesthesia plan  Airway & Oxygen Therapy: Patient remains intubated per anesthesia plan and Patient placed on Ventilator (see vital sign flow sheet for setting)  Post-op Assessment: Report given to RN and Post -op Vital signs reviewed and stable  Post vital signs: Reviewed and stable  Last Vitals:  Vitals Value Taken Time  BP 107/69 10/02/19 1300  Temp    Pulse 75 10/02/19 1305  Resp 12 10/02/19 1305  SpO2 100 % 10/02/19 1305  Vitals shown include unvalidated device data.  Last Pain:  Vitals:   10/02/19 0434  TempSrc: Oral  PainSc: 0-No pain         Complications: No apparent anesthesia complications

## 2019-10-02 NOTE — Anesthesia Procedure Notes (Signed)
Procedure Name: Intubation Date/Time: 10/02/2019 8:20 AM Performed by: Traven Davids T, CRNA Pre-anesthesia Checklist: Patient identified, Emergency Drugs available, Suction available and Patient being monitored Patient Re-evaluated:Patient Re-evaluated prior to induction Oxygen Delivery Method: Circle system utilized Preoxygenation: Pre-oxygenation with 100% oxygen Induction Type: IV induction Ventilation: Mask ventilation without difficulty Laryngoscope Size: Mac and 3 Grade View: Grade I Tube type: Oral Tube size: 7.5 mm Number of attempts: 1 Airway Equipment and Method: Stylet and Oral airway Placement Confirmation: ETT inserted through vocal cords under direct vision,  positive ETCO2 and breath sounds checked- equal and bilateral Secured at: 21 cm Tube secured with: Tape Dental Injury: Teeth and Oropharynx as per pre-operative assessment and Injury to tongue  Comments: Intubated by Doretha Sou, SRNA

## 2019-10-02 NOTE — OR Nursing (Signed)
Twenty minute call to 2 Heart Charge nurse at 1219. Spoke to [place]. Cath Lab also notified of timing. Spoke to [place].

## 2019-10-02 NOTE — Anesthesia Procedure Notes (Signed)
Arterial Line Insertion Start/End6/06/2019 7:10 AM, 10/02/2019 7:20 AM Performed by: Brighten Buzzelli T, Immunologist, CRNA  Patient location: Pre-op. Preanesthetic checklist: patient identified, IV checked, site marked, risks and benefits discussed, surgical consent, monitors and equipment checked and pre-op evaluation Lidocaine 1% used for infiltration and patient sedated Right, radial was placed Catheter size: 20 G Hand hygiene performed  and maximum sterile barriers used   Attempts: 1 Procedure performed without using ultrasound guided technique. Following insertion, dressing applied. Post procedure assessment: normal  Patient tolerated the procedure well with no immediate complications.

## 2019-10-02 NOTE — Procedures (Signed)
Extubation Procedure Note  Patient Details:   Name: Courtney Gardner DOB: August 08, 1950 MRN: [id number]   Airway Documentation:    Vent end date: 10/02/19 Vent end time: 1830   Evaluation  O2 sats: stable throughout Complications: No apparent complications Patient did tolerate procedure well. Bilateral Breath Sounds: Clear, Diminished   Patient extubated per SICU wean protocol. Placed on 4L [place]. Patient able to speak & has good cough. IS instructed 750 x 5.  Kathie Dike 10/02/2019, 6:38 PM

## 2019-10-02 NOTE — Progress Notes (Signed)
°  Echocardiogram Echocardiogram Transesophageal has been performed.  Courtney Gardner 10/02/2019, 9:53 AM

## 2019-10-02 NOTE — Anesthesia Preprocedure Evaluation (Signed)
Anesthesia Evaluation  Patient identified by MRN, date of birth, ID band Patient awake    Reviewed: Allergy & Precautions, H&P , NPO status , Patient's Chart, lab work & pertinent test results  Airway Mallampati: II   Neck ROM: full    Dental   Pulmonary former smoker,    breath sounds clear to auscultation       Cardiovascular hypertension, + angina + CAD   Rhythm:regular Rate:Normal  TTE: EF 50-55%, normal valves   Neuro/Psych    GI/Hepatic   Endo/Other  diabetes, Type 2  Renal/GU      Musculoskeletal   Abdominal   Peds  Hematology   Anesthesia Other Findings   Reproductive/Obstetrics                             Anesthesia Physical Anesthesia Plan  ASA: III  Anesthesia Plan: General   Post-op Pain Management:    Induction: Intravenous  PONV Risk Score and Plan: 3 and Ondansetron, Dexamethasone, Midazolam and Treatment may vary due to age or medical condition  Airway Management Planned: Oral ETT  Additional Equipment: Arterial line, CVP, TEE and Ultrasound Guidance Line Placement  Intra-op Plan:   Post-operative Plan: Post-operative intubation/ventilation  Informed Consent: I have reviewed the patients History and Physical, chart, labs and discussed the procedure including the risks, benefits and alternatives for the proposed anesthesia with the patient or authorized representative who has indicated his/her understanding and acceptance.       Plan Discussed with: CRNA, Anesthesiologist and Surgeon  Anesthesia Plan Comments:         Anesthesia Quick Evaluation

## 2019-10-02 NOTE — OR Nursing (Signed)
Forty-five minute call to 2 Heart Charge Nurse at 1137. Spoke to [place].

## 2019-10-02 NOTE — Progress Notes (Signed)
     [address]       [place] [address]             [phone]       No events overnight OR today for CABG [place]

## 2019-10-02 NOTE — Progress Notes (Signed)
RT obtained ABG with the following results. RT will continue to monitor.   Results for ZELDY, AHLERT (MRN [id number]) as of 10/02/2019 05:36  Ref. Range 10/02/2019 04:32  Sample type Unknown ARTERIAL  FIO2 Unknown 21.00  pH, Arterial Latest Ref Range: 7.350 - 7.450  7.385  pCO2 arterial Latest Ref Range: 32.0 - 48.0 mmHg 39.8  pO2, Arterial Latest Ref Range: 83.0 - 108.0 mmHg 101  Acid-base deficit Latest Ref Range: 0.0 - 2.0 mmol/L 1.1  Bicarbonate Latest Ref Range: 20.0 - 28.0 mmol/L 23.3  O2 Saturation Latest Units: % 98.0  Patient temperature Unknown 37.0  Collection site Unknown RIGHT BRACHIAL  Allens test (pass/fail) Latest Ref Range: PASS  PASS

## 2019-10-02 NOTE — Anesthesia Procedure Notes (Signed)
Central Venous Catheter Insertion Performed by: Albertha Ghee, MD, anesthesiologist Start/End6/06/2019 7:02 AM, 10/02/2019 7:12 AM Patient location: Pre-op. Preanesthetic checklist: patient identified, IV checked, site marked, risks and benefits discussed, surgical consent, monitors and equipment checked, pre-op evaluation, timeout performed and anesthesia consent Position: Trendelenburg Lidocaine 1% used for infiltration and patient sedated Hand hygiene performed , maximum sterile barriers used  and Seldinger technique used Catheter size: 8.5 Fr Central line was placed.MAC introducer Swan type:thermodilation Procedure performed using ultrasound guided technique. Ultrasound Notes:anatomy identified, needle tip was noted to be adjacent to the nerve/plexus identified, no ultrasound evidence of intravascular and/or intraneural injection and image(s) printed for medical record Attempts: 1 Following insertion, line sutured, dressing applied and Biopatch. Post procedure assessment: blood return through all ports, free fluid flow and no air  Patient tolerated the procedure well with no immediate complications.

## 2019-10-02 NOTE — Op Note (Signed)
[address]       [place] [address]             [phone]                                          10/02/2019 Patient:  Courtney Gardner Pre-Op Dx: left main, 2V CAD     HTN   DM Post-op Dx:  same Procedure: CABG X 2.  LIMA LAD, RSVG OM1   Endoscopic greater saphenous vein harvest on the right Intra-operative Transesophageal Echocardiogram  Surgeon and Role:      * Naeema Patlan, Lucile Crater, MD - Primary Assistant: Leretha Pol, PA-C  Anesthesia  general EBL:  231ml Blood Administration: none Xclamp Time:  42 min Pump Time:  59min  Drains: 19 F blake drain:  R, L, mediastinal  Wires: none Counts: correct   Indications: This is a 69 year old female admitted following an elective left heart cath which shows left main coronary artery disease.  She has a long history of anginal symptoms and originally had a stress test which was negative but her coronary CT was indicative of severe disease.  I personally reviewed her left heart cath and she has a left dominant system and a tight stenosis in the distal left main.  The first marginal also has a tight stenosis.  On review of her echocardiogram she has no significant valvular disease and her LV and RV function are preserved.  The risks and benefits of surgical revascularization were discussed with her in detail, and she is scheduled for CABG x3 on 10/02/2019.  Findings: Good LIMA, good vein.  Good targets.  OM was intramyocardial.  Circumflex vessel was in the AV groove.  PDA was small. Good function on post CPB TEE.  Operative Technique: All invasive lines were placed in pre-op holding.  After the risks, benefits and alternatives were thoroughly discussed, the patient was brought to the operative theatre.  Anesthesia was induced, and the patient was prepped and draped in normal sterile fashion.  An appropriate surgical pause was performed, and pre-operative antibiotics were dosed accordingly.  We began with  simultaneous incisions were made along the right leg for harvesting of the greater saphenous vein and the chest for the sternotomy.  In regards to the sternotomy, this was carried down with bovie cautery, and the sternum was divided with a reciprocating saw.  Meticulous hemostasis was obtained.  The left internal thoracic artery was exposed and harvested in in pedicled fashion.  The patient was systemically heparinized, and the artery was divided distally, and placed in a papaverine sponge.    The sternal elevator was removed, and a retractor was placed.  The pericardium was divided in the midline and fashioned into a cradle with pericardial stitches.   After we confirmed an appropriate ACT, the ascending aorta was cannulated in standard fashion.  The right atrial appendage was used for venous cannulation site.  Cardiopulmonary bypass was initiated, and the heart retractor was placed. The cross clamp was applied, and a dose of anterograde cardioplegia was given with good arrest of the heart.  Next we exposed the lateral wall, and found a good target on the OM1.  An end to side anastomosis with the vein graft was then created.  Finally, we exposed a good target on the  LAD, and fashioned an end to side  anastomosis between it and the LITA.  We began to re-warm, and a re-animation dose of cardioplegia was given.  The heart was de-aired, and the cross clamp was removed.  Meticulous hemostasis was obtained.    A partial occludding clamp was then placed on the ascending aorta, and we created an end to side anastomosis between it and the proximal vein graft.  The proximal site was marked with a ring.  Hemostasis was obtained, and we separated from cardiopulmonary bypass without event.the heparin was reversed with protamine.  Chest tubes and wires were placed, and the sternum was re-approximated with with sternal wires.  The soft tissue and skin were re-approximated wth absorbable suture.    The patient tolerated the  procedure without any immediate complications, and was transferred to the ICU in guarded condition.  Courtney Gardner

## 2019-10-03 ENCOUNTER — Inpatient Hospital Stay (HOSPITAL_COMMUNITY): Payer: Medicare Other | Admitting: Certified Registered Nurse Anesthetist

## 2019-10-03 ENCOUNTER — Inpatient Hospital Stay (HOSPITAL_COMMUNITY): Payer: Medicare Other

## 2019-10-03 ENCOUNTER — Encounter (HOSPITAL_COMMUNITY)
Admission: RE | Disposition: A | Discharge status: Home Health | Payer: Self-pay | Source: Home / Self Care | Attending: Thoracic Surgery (Cardiothoracic Vascular Surgery)

## 2019-10-03 DIAGNOSIS — I9789 Other postprocedural complications and disorders of the circulatory system, not elsewhere classified: Secondary | ICD-10-CM

## 2019-10-03 HISTORY — PX: MEDIASTINAL EXPLORATION: SHX5065

## 2019-10-03 LAB — POCT I-STAT 7, (LYTES, BLD GAS, ICA,H+H)
Acid-base deficit: 3 mmol/L — ABNORMAL HIGH (ref 0.0–2.0)
Acid-base deficit: 2 mmol/L (ref 0.0–2.0)
Acid-base deficit: 3 mmol/L — ABNORMAL HIGH (ref 0.0–2.0)
Acid-base deficit: 2 mmol/L (ref 0.0–2.0)
Acid-base deficit: 3 mmol/L — ABNORMAL HIGH (ref 0.0–2.0)
Acid-base deficit: 3 mmol/L — ABNORMAL HIGH (ref 0.0–2.0)
Bicarbonate: 22.6 mmol/L (ref 20.0–28.0)
Bicarbonate: 21.8 mmol/L (ref 20.0–28.0)
Bicarbonate: 22 mmol/L (ref 20.0–28.0)
Bicarbonate: 21.7 mmol/L (ref 20.0–28.0)
Bicarbonate: 22.1 mmol/L (ref 20.0–28.0)
Bicarbonate: 22.4 mmol/L (ref 20.0–28.0)
Calcium, Ion: 1.2 mmol/L (ref 1.15–1.40)
Calcium, Ion: 1.03 mmol/L — ABNORMAL LOW (ref 1.15–1.40)
Calcium, Ion: 1.08 mmol/L — ABNORMAL LOW (ref 1.15–1.40)
Calcium, Ion: 1.14 mmol/L — ABNORMAL LOW (ref 1.15–1.40)
Calcium, Ion: 1.17 mmol/L (ref 1.15–1.40)
Calcium, Ion: 1.21 mmol/L (ref 1.15–1.40)
HCT: 16 % — ABNORMAL LOW (ref 36.0–46.0)
HCT: 21 % — ABNORMAL LOW (ref 36.0–46.0)
HCT: 29 % — ABNORMAL LOW (ref 36.0–46.0)
HCT: 31 % — ABNORMAL LOW (ref 36.0–46.0)
HCT: 32 % — ABNORMAL LOW (ref 36.0–46.0)
HCT: 35 % — ABNORMAL LOW (ref 36.0–46.0)
Hemoglobin: 5.4 g/dL — CL (ref 12.0–15.0)
Hemoglobin: 7.1 g/dL — ABNORMAL LOW (ref 12.0–15.0)
Hemoglobin: 9.9 g/dL — ABNORMAL LOW (ref 12.0–15.0)
Hemoglobin: 10.5 g/dL — ABNORMAL LOW (ref 12.0–15.0)
Hemoglobin: 10.9 g/dL — ABNORMAL LOW (ref 12.0–15.0)
Hemoglobin: 11.9 g/dL — ABNORMAL LOW (ref 12.0–15.0)
O2 Saturation: 99 %
O2 Saturation: 100 %
O2 Saturation: 99 %
O2 Saturation: 97 %
O2 Saturation: 94 %
O2 Saturation: 95 %
Patient temperature: 37.2
Patient temperature: 35.1
Patient temperature: 35.7
Patient temperature: 35
Patient temperature: 37.1
Patient temperature: 37.5
Potassium: 3.5 mmol/L (ref 3.5–5.1)
Potassium: 3.5 mmol/L (ref 3.5–5.1)
Potassium: 3.6 mmol/L (ref 3.5–5.1)
Potassium: 3.7 mmol/L (ref 3.5–5.1)
Potassium: 3.9 mmol/L (ref 3.5–5.1)
Potassium: 4.3 mmol/L (ref 3.5–5.1)
Sodium: 143 mmol/L (ref 135–145)
Sodium: 143 mmol/L (ref 135–145)
Sodium: 144 mmol/L (ref 135–145)
Sodium: 144 mmol/L (ref 135–145)
Sodium: 143 mmol/L (ref 135–145)
Sodium: 143 mmol/L (ref 135–145)
TCO2: 24 mmol/L (ref 22–32)
TCO2: 23 mmol/L (ref 22–32)
TCO2: 23 mmol/L (ref 22–32)
TCO2: 23 mmol/L (ref 22–32)
TCO2: 23 mmol/L (ref 22–32)
TCO2: 24 mmol/L (ref 22–32)
pCO2 arterial: 42 mmHg (ref 32.0–48.0)
pCO2 arterial: 33.1 mmHg (ref 32.0–48.0)
pCO2 arterial: 36.1 mmHg (ref 32.0–48.0)
pCO2 arterial: 29.2 mmHg — ABNORMAL LOW (ref 32.0–48.0)
pCO2 arterial: 38.6 mmHg (ref 32.0–48.0)
pCO2 arterial: 39.9 mmHg (ref 32.0–48.0)
pH, Arterial: 7.339 — ABNORMAL LOW (ref 7.350–7.450)
pH, Arterial: 7.382 (ref 7.350–7.450)
pH, Arterial: 7.423 (ref 7.350–7.450)
pH, Arterial: 7.472 — ABNORMAL HIGH (ref 7.350–7.450)
pH, Arterial: 7.365 (ref 7.350–7.450)
pH, Arterial: 7.36 (ref 7.350–7.450)
pO2, Arterial: 151 mmHg — ABNORMAL HIGH (ref 83.0–108.0)
pO2, Arterial: 115 mmHg — ABNORMAL HIGH (ref 83.0–108.0)
pO2, Arterial: 296 mmHg — ABNORMAL HIGH (ref 83.0–108.0)
pO2, Arterial: 76 mmHg — ABNORMAL LOW (ref 83.0–108.0)
pO2, Arterial: 73 mmHg — ABNORMAL LOW (ref 83.0–108.0)
pO2, Arterial: 79 mmHg — ABNORMAL LOW (ref 83.0–108.0)

## 2019-10-03 LAB — BPAM FFP
Blood Product Expiration Date: 202106082359
Blood Product Expiration Date: 202106082359
ISSUE DATE / TIME: 202106032223
ISSUE DATE / TIME: 202106032223
Unit Type and Rh: 5100
Unit Type and Rh: 5100

## 2019-10-03 LAB — GLUCOSE, CAPILLARY
Glucose-Capillary: 102 mg/dL — ABNORMAL HIGH (ref 70–99)
Glucose-Capillary: 105 mg/dL — ABNORMAL HIGH (ref 70–99)
Glucose-Capillary: 106 mg/dL — ABNORMAL HIGH (ref 70–99)
Glucose-Capillary: 107 mg/dL — ABNORMAL HIGH (ref 70–99)
Glucose-Capillary: 109 mg/dL — ABNORMAL HIGH (ref 70–99)
Glucose-Capillary: 111 mg/dL — ABNORMAL HIGH (ref 70–99)
Glucose-Capillary: 119 mg/dL — ABNORMAL HIGH (ref 70–99)
Glucose-Capillary: 126 mg/dL — ABNORMAL HIGH (ref 70–99)
Glucose-Capillary: 132 mg/dL — ABNORMAL HIGH (ref 70–99)
Glucose-Capillary: 133 mg/dL — ABNORMAL HIGH (ref 70–99)
Glucose-Capillary: 144 mg/dL — ABNORMAL HIGH (ref 70–99)
Glucose-Capillary: 145 mg/dL — ABNORMAL HIGH (ref 70–99)
Glucose-Capillary: 146 mg/dL — ABNORMAL HIGH (ref 70–99)
Glucose-Capillary: 164 mg/dL — ABNORMAL HIGH (ref 70–99)
Glucose-Capillary: 177 mg/dL — ABNORMAL HIGH (ref 70–99)
Glucose-Capillary: 180 mg/dL — ABNORMAL HIGH (ref 70–99)
Glucose-Capillary: 199 mg/dL — ABNORMAL HIGH (ref 70–99)
Glucose-Capillary: 91 mg/dL (ref 70–99)
Glucose-Capillary: 97 mg/dL (ref 70–99)

## 2019-10-03 LAB — BASIC METABOLIC PANEL
Anion gap: 8 (ref 5–15)
Anion gap: 9 (ref 5–15)
BUN: 11 mg/dL (ref 8–23)
BUN: 11 mg/dL (ref 8–23)
CO2: 23 mmol/L (ref 22–32)
CO2: 21 mmol/L — ABNORMAL LOW (ref 22–32)
Calcium: 7.9 mg/dL — ABNORMAL LOW (ref 8.9–10.3)
Calcium: 8 mg/dL — ABNORMAL LOW (ref 8.9–10.3)
Chloride: 107 mmol/L (ref 98–111)
Chloride: 109 mmol/L (ref 98–111)
Creatinine, Ser: 0.93 mg/dL (ref 0.44–1.00)
Creatinine, Ser: 0.95 mg/dL (ref 0.44–1.00)
GFR calc Af Amer: >60 mL/min (ref >60)
GFR calc Af Amer: >60 mL/min (ref >60)
GFR calc non Af Amer: >60 mL/min (ref >60)
GFR calc non Af Amer: >60 mL/min (ref >60)
Glucose, Bld: 138 mg/dL — ABNORMAL HIGH (ref 70–99)
Glucose, Bld: 102 mg/dL — ABNORMAL HIGH (ref 70–99)
Potassium: 3.8 mmol/L (ref 3.5–5.1)
Potassium: 4.3 mmol/L (ref 3.5–5.1)
Sodium: 138 mmol/L (ref 135–145)
Sodium: 139 mmol/L (ref 135–145)

## 2019-10-03 LAB — PREPARE FRESH FROZEN PLASMA
Unit division: 0
Unit division: 0

## 2019-10-03 LAB — ECHO INTRAOPERATIVE TEE
Height: 66 in
Weight: 2398.6 oz

## 2019-10-03 LAB — CBC
HCT: 18.6 % — ABNORMAL LOW (ref 36.0–46.0)
HCT: 37.6 % (ref 36.0–46.0)
Hemoglobin: 6.2 g/dL — CL (ref 12.0–15.0)
Hemoglobin: 12.8 g/dL (ref 12.0–15.0)
MCH: 30.1 pg (ref 26.0–34.0)
MCH: 29.8 pg (ref 26.0–34.0)
MCHC: 33.3 g/dL (ref 30.0–36.0)
MCHC: 34 g/dL (ref 30.0–36.0)
MCV: 90.3 fL (ref 80.0–100.0)
MCV: 87.4 fL (ref 80.0–100.0)
Platelets: 148 10*3/uL — ABNORMAL LOW (ref 150–400)
Platelets: 101 10*3/uL — ABNORMAL LOW (ref 150–400)
RBC: 2.06 MIL/uL — ABNORMAL LOW (ref 3.87–5.11)
RBC: 4.3 MIL/uL (ref 3.87–5.11)
RDW: 13.5 % (ref 11.5–15.5)
RDW: 13.7 % (ref 11.5–15.5)
WBC: 8.8 10*3/uL (ref 4.0–10.5)
WBC: 12.8 10*3/uL — ABNORMAL HIGH (ref 4.0–10.5)
nRBC: 0 % (ref 0.0–0.2)
nRBC: 0 % (ref 0.0–0.2)

## 2019-10-03 LAB — PROTIME-INR
INR: 1.1 (ref 0.8–1.2)
Prothrombin Time: 14.1 seconds (ref 11.4–15.2)

## 2019-10-03 LAB — MAGNESIUM
Magnesium: 2.1 mg/dL (ref 1.7–2.4)
Magnesium: 1.8 mg/dL (ref 1.7–2.4)

## 2019-10-03 LAB — PREPARE RBC (CROSSMATCH)

## 2019-10-03 LAB — PREPARE PLATELET PHERESIS: Unit division: 0

## 2019-10-03 LAB — BPAM PLATELET PHERESIS
Blood Product Expiration Date: 202106052359
ISSUE DATE / TIME: 202106032223
Unit Type and Rh: 6200

## 2019-10-03 SURGERY — EXPLORATION, MEDIASTINUM
Anesthesia: General | Site: Chest

## 2019-10-03 MED ORDER — FENTANYL CITRATE (PF) 100 MCG/2ML IJ SOLN
INTRAMUSCULAR | Status: DC | PRN
  Administered 2019-10-03: 150 ug via INTRAVENOUS
  Administered 2019-10-03: 50 ug via INTRAVENOUS

## 2019-10-03 MED ORDER — DEXMEDETOMIDINE HCL IN NACL 200 MCG/50ML IV SOLN
INTRAVENOUS | Status: AC
  Filled 2019-10-03: qty 50

## 2019-10-03 MED ORDER — LIDOCAINE 2% (20 MG/ML) 5 ML SYRINGE
INTRAMUSCULAR | Status: AC
  Filled 2019-10-03: qty 10

## 2019-10-03 MED ORDER — ORAL CARE MOUTH RINSE
15.0000 mL | OROMUCOSAL | Status: DC
  Administered 2019-10-03 (×3): 15 mL via OROMUCOSAL

## 2019-10-03 MED ORDER — LIDOCAINE 2% (20 MG/ML) 5 ML SYRINGE
INTRAMUSCULAR | Status: DC | PRN
  Administered 2019-10-03: 50 mg via INTRAVENOUS

## 2019-10-03 MED ORDER — ALUM & MAG HYDROXIDE-SIMETH 200-200-20 MG/5ML PO SUSP
30.0000 mL | Freq: Four times a day (QID) | ORAL | Status: DC | PRN
  Administered 2019-10-03 – 2019-10-04 (×2): 30 mL via ORAL
  Filled 2019-10-03 (×2): qty 30

## 2019-10-03 MED ORDER — SODIUM CHLORIDE 0.9 % IV SOLN
INTRAVENOUS | Status: DC | PRN
Start: 2019-10-03 — End: 2019-10-03

## 2019-10-03 MED ORDER — ONDANSETRON HCL 4 MG/2ML IJ SOLN
INTRAMUSCULAR | Status: AC
  Filled 2019-10-03: qty 4

## 2019-10-03 MED ORDER — PROPOFOL 10 MG/ML IV BOLUS
INTRAVENOUS | Status: DC | PRN
  Administered 2019-10-03: 70 mg via INTRAVENOUS

## 2019-10-03 MED ORDER — SODIUM CHLORIDE 0.9% FLUSH
10.0000 mL | Freq: Two times a day (BID) | INTRAVENOUS | Status: DC
  Administered 2019-10-03 – 2019-10-05 (×5): 10 mL

## 2019-10-03 MED ORDER — MIDAZOLAM HCL 2 MG/2ML IJ SOLN
INTRAMUSCULAR | Status: AC
  Filled 2019-10-03: qty 2

## 2019-10-03 MED ORDER — 0.9 % SODIUM CHLORIDE (POUR BTL) OPTIME
TOPICAL | Status: DC | PRN
  Administered 2019-10-03 (×4): 1000 mL

## 2019-10-03 MED ORDER — PROPOFOL 10 MG/ML IV BOLUS
INTRAVENOUS | Status: AC
  Filled 2019-10-03: qty 20

## 2019-10-03 MED ORDER — SODIUM CHLORIDE 0.9% FLUSH: 10.0000 mL | INTRAVENOUS | Status: DC | PRN

## 2019-10-03 MED ORDER — LACTATED RINGERS IV SOLN: INTRAVENOUS | Status: DC | PRN

## 2019-10-03 MED ORDER — ONDANSETRON HCL 4 MG/2ML IJ SOLN
INTRAMUSCULAR | Status: DC | PRN
  Administered 2019-10-03: 4 mg via INTRAVENOUS

## 2019-10-03 MED ORDER — FENTANYL CITRATE (PF) 250 MCG/5ML IJ SOLN
INTRAMUSCULAR | Status: AC
  Filled 2019-10-03: qty 5

## 2019-10-03 MED ORDER — AMIODARONE HCL IN DEXTROSE 360-4.14 MG/200ML-% IV SOLN
30.0000 mg/h | INTRAVENOUS | Status: DC
  Administered 2019-10-04 (×2): 30 mg/h via INTRAVENOUS
  Filled 2019-10-03: qty 200

## 2019-10-03 MED ORDER — POTASSIUM CHLORIDE 10 MEQ/50ML IV SOLN
10.0000 meq | INTRAVENOUS | Status: AC
  Administered 2019-10-03 (×3): 10 meq via INTRAVENOUS
  Filled 2019-10-03 (×3): qty 50

## 2019-10-03 MED ORDER — AMIODARONE LOAD VIA INFUSION
150.0000 mg | Freq: Once | INTRAVENOUS | Status: AC
  Administered 2019-10-03: 150 mg via INTRAVENOUS
  Filled 2019-10-03: qty 83.34

## 2019-10-03 MED ORDER — ORAL CARE MOUTH RINSE
15.0000 mL | Freq: Two times a day (BID) | OROMUCOSAL | Status: DC
  Administered 2019-10-03 – 2019-10-07 (×7): 15 mL via OROMUCOSAL

## 2019-10-03 MED ORDER — HEPARIN SODIUM (PORCINE) 1000 UNIT/ML IJ SOLN
INTRAMUSCULAR | Status: AC
  Filled 2019-10-03: qty 1

## 2019-10-03 MED ORDER — PROTAMINE SULFATE 10 MG/ML IV SOLN
INTRAVENOUS | Status: AC
  Filled 2019-10-03: qty 5

## 2019-10-03 MED ORDER — AMIODARONE HCL IN DEXTROSE 360-4.14 MG/200ML-% IV SOLN
60.0000 mg/h | INTRAVENOUS | Status: DC
  Administered 2019-10-03 (×2): 60 mg/h via INTRAVENOUS
  Filled 2019-10-03 (×2): qty 200

## 2019-10-03 MED ORDER — MIDAZOLAM HCL 5 MG/5ML IJ SOLN
INTRAMUSCULAR | Status: DC | PRN
  Administered 2019-10-03 (×2): 1 mg via INTRAVENOUS

## 2019-10-03 MED ORDER — ROCURONIUM BROMIDE 10 MG/ML (PF) SYRINGE
PREFILLED_SYRINGE | INTRAVENOUS | Status: DC | PRN
  Administered 2019-10-03: 60 mg via INTRAVENOUS

## 2019-10-03 MED ORDER — SODIUM CHLORIDE 0.9% IV SOLUTION: Freq: Once | INTRAVENOUS | Status: AC

## 2019-10-03 MED ORDER — ROCURONIUM BROMIDE 10 MG/ML (PF) SYRINGE
PREFILLED_SYRINGE | INTRAVENOUS | Status: AC
  Filled 2019-10-03: qty 30

## 2019-10-03 MED ORDER — PHENYLEPHRINE 40 MCG/ML (10ML) SYRINGE FOR IV PUSH (FOR BLOOD PRESSURE SUPPORT)
PREFILLED_SYRINGE | INTRAVENOUS | Status: AC
  Filled 2019-10-03: qty 20

## 2019-10-03 MED ORDER — DEXMEDETOMIDINE HCL IN NACL 200 MCG/50ML IV SOLN
INTRAVENOUS | Status: DC | PRN
Start: 2019-10-03 — End: 2019-10-03
  Administered 2019-10-03: .7 ug/kg/h via INTRAVENOUS

## 2019-10-03 MED ORDER — CHLORHEXIDINE GLUCONATE 0.12% ORAL RINSE (MEDLINE KIT)
15.0000 mL | Freq: Two times a day (BID) | OROMUCOSAL | Status: DC
  Administered 2019-10-03: 15 mL via OROMUCOSAL

## 2019-10-03 MED ORDER — DEXAMETHASONE SODIUM PHOSPHATE 10 MG/ML IJ SOLN
INTRAMUSCULAR | Status: AC
  Filled 2019-10-03: qty 2

## 2019-10-03 MED FILL — Heparin Sodium (Porcine) Inj 1000 Unit/ML: INTRAMUSCULAR | Qty: 30 | Status: AC

## 2019-10-03 MED FILL — Albumin, Human Inj 5%: INTRAVENOUS | Qty: 250 | Status: AC

## 2019-10-03 MED FILL — Sodium Chloride IV Soln 0.9%: INTRAVENOUS | Qty: 2000 | Status: AC

## 2019-10-03 MED FILL — Mannitol IV Soln 20%: INTRAVENOUS | Qty: 500 | Status: AC

## 2019-10-03 MED FILL — Calcium Chloride Inj 10%: INTRAVENOUS | Qty: 10 | Status: AC

## 2019-10-03 MED FILL — Magnesium Sulfate Inj 50%: INTRAMUSCULAR | Qty: 10 | Status: CN

## 2019-10-03 MED FILL — Electrolyte-R (PH 7.4) Solution: INTRAVENOUS | Qty: 4000 | Status: AC

## 2019-10-03 MED FILL — Sodium Bicarbonate IV Soln 8.4%: INTRAVENOUS | Qty: 50 | Status: AC

## 2019-10-03 MED FILL — Heparin Sodium (Porcine) Inj 1000 Unit/ML: INTRAMUSCULAR | Qty: 10 | Status: AC

## 2019-10-03 MED FILL — Lidocaine HCl Local Preservative Free (PF) Inj 2%: INTRAMUSCULAR | Qty: 15 | Status: AC

## 2019-10-03 MED FILL — Potassium Chloride Inj 2 mEq/ML: INTRAVENOUS | Qty: 40 | Status: AC

## 2019-10-03 SURGICAL SUPPLY — 68 items
BAG DECANTER FOR FLEXI CONT (MISCELLANEOUS) ×2 IMPLANT
BLADE CLIPPER SURG (BLADE) ×2 IMPLANT
BLADE STERNUM SYSTEM 6 (BLADE) ×2 IMPLANT
BNDG ELASTIC 4X5.8 VLCR STR LF (GAUZE/BANDAGES/DRESSINGS) ×2 IMPLANT
BNDG ELASTIC 6X5.8 VLCR STR LF (GAUZE/BANDAGES/DRESSINGS) ×2 IMPLANT
BNDG GAUZE ELAST 4 BULKY (GAUZE/BANDAGES/DRESSINGS) ×2 IMPLANT
CABLE SURGICAL S-101-97-12 (CABLE) ×2 IMPLANT
CANISTER SUCT 3000ML PPV (MISCELLANEOUS) ×2 IMPLANT
CANNULA EZ GLIDE 8.0 24FR (CANNULA) ×2 IMPLANT
CANNULA MC2 2 STG 29/37 NON-V (CANNULA) ×1 IMPLANT
CANNULA MC2 TWO STAGE (CANNULA) ×2
CATH ROBINSON RED A/P 18FR (CATHETERS) ×4 IMPLANT
CLIP RETRACTION 3.0MM CORONARY (MISCELLANEOUS) ×2 IMPLANT
CLIP VESOCCLUDE MED 24/CT (CLIP) IMPLANT
CLIP VESOCCLUDE SM WIDE 24/CT (CLIP) IMPLANT
CONN ST 1/2X1/2  BEN (MISCELLANEOUS) ×2
CONN ST 1/2X1/2 BEN (MISCELLANEOUS) ×1 IMPLANT
CONNECTOR BLAKE 2:1 CARIO BLK (MISCELLANEOUS) ×2 IMPLANT
DRAIN CHANNEL 19F RND (DRAIN) ×6 IMPLANT
DRAIN CONNECTOR BLAKE 1:1 (MISCELLANEOUS) ×2 IMPLANT
DRAPE CARDIOVASCULAR INCISE (DRAPES) ×2
DRAPE INCISE IOBAN 66X45 STRL (DRAPES) IMPLANT
DRAPE SLUSH/WARMER DISC (DRAPES) ×2 IMPLANT
DRAPE SRG 135X102X78XABS (DRAPES) ×1 IMPLANT
DRSG AQUACEL AG ADV 3.5X10 (GAUZE/BANDAGES/DRESSINGS) ×2 IMPLANT
DRSG COVADERM 4X14 (GAUZE/BANDAGES/DRESSINGS) ×2 IMPLANT
ELECT REM PT RETURN 9FT ADLT (ELECTROSURGICAL) ×4
ELECTRODE REM PT RTRN 9FT ADLT (ELECTROSURGICAL) ×2 IMPLANT
FELT TEFLON 1X6 (MISCELLANEOUS) ×4 IMPLANT
GAUZE SPONGE 4X4 12PLY STRL (GAUZE/BANDAGES/DRESSINGS) ×4 IMPLANT
GLOVE BIO SURGEON STRL SZ7 (GLOVE) ×4 IMPLANT
GLOVE BIOGEL M STRL SZ7.5 (GLOVE) ×4 IMPLANT
GOWN STRL REUS W/ TWL LRG LVL3 (GOWN DISPOSABLE) ×4 IMPLANT
GOWN STRL REUS W/ TWL XL LVL3 (GOWN DISPOSABLE) ×2 IMPLANT
GOWN STRL REUS W/TWL LRG LVL3 (GOWN DISPOSABLE) ×8
GOWN STRL REUS W/TWL XL LVL3 (GOWN DISPOSABLE) ×4
HEMOSTAT POWDER SURGIFOAM 1G (HEMOSTASIS) ×6 IMPLANT
KIT BASIN OR (CUSTOM PROCEDURE TRAY) ×2 IMPLANT
KIT SUCTION CATH 14FR (SUCTIONS) ×2 IMPLANT
KIT TURNOVER KIT B (KITS) ×2 IMPLANT
KIT VASOVIEW HEMOPRO 2 VH 4000 (KITS) ×2 IMPLANT
LEAD PACING MYOCARDI (MISCELLANEOUS) ×2 IMPLANT
MARKER GRAFT CORONARY BYPASS (MISCELLANEOUS) ×6 IMPLANT
NS IRRIG 1000ML POUR BTL (IV SOLUTION) ×10 IMPLANT
PACK ACCESSORY CANNULA KIT (KITS) ×2 IMPLANT
PACK E OPEN HEART (SUTURE) ×2 IMPLANT
PACK OPEN HEART (CUSTOM PROCEDURE TRAY) ×2 IMPLANT
PAD ARMBOARD 7.5X6 YLW CONV (MISCELLANEOUS) ×2 IMPLANT
PAD ELECT DEFIB RADIOL ZOLL (MISCELLANEOUS) ×2 IMPLANT
PENCIL BUTTON HOLSTER BLD 10FT (ELECTRODE) ×2 IMPLANT
POSITIONER HEAD DONUT 9IN (MISCELLANEOUS) ×2 IMPLANT
PUNCH AORTIC ROTATE 4.0MM (MISCELLANEOUS) ×1 IMPLANT
SUPPORT HEART JANKE-BARRON (MISCELLANEOUS) ×1 IMPLANT
SUT BONE WAX W31G (SUTURE) ×1 IMPLANT
SUT ETHIBOND X763 2 0 SH 1 (SUTURE) ×6 IMPLANT
SUT MNCRL AB 3-0 PS2 18 (SUTURE) ×6 IMPLANT
SUT PDS AB 1 CTX 36 (SUTURE) ×4 IMPLANT
SUT PROLENE 4 0 SH DA (SUTURE) ×2 IMPLANT
SUT PROLENE 5 0 C 1 36 (SUTURE) ×6 IMPLANT
SUT PROLENE 7 0 BV1 MDA (SUTURE) ×2 IMPLANT
SUT STEEL 6MS V (SUTURE) ×4 IMPLANT
SYSTEM SAHARA CHEST DRAIN ATS (WOUND CARE) ×2 IMPLANT
TOWEL GREEN STERILE (TOWEL DISPOSABLE) ×2 IMPLANT
TOWEL GREEN STERILE FF (TOWEL DISPOSABLE) ×2 IMPLANT
TRAY FOLEY SLVR 16FR TEMP STAT (SET/KITS/TRAYS/PACK) ×2 IMPLANT
TUBING LAP HI FLOW INSUFFLATIO (TUBING) ×2 IMPLANT
UNDERPAD 30X36 HEAVY ABSORB (UNDERPADS AND DIAPERS) ×2 IMPLANT
WATER STERILE IRR 1000ML POUR (IV SOLUTION) ×4 IMPLANT

## 2019-10-03 NOTE — Anesthesia Procedure Notes (Signed)
Procedure Name: Intubation Date/Time: 10/03/2019 8:12 AM Performed by: Neldon Newport, CRNA Pre-anesthesia Checklist: Timeout performed, Patient being monitored, Suction available, Emergency Drugs available and Patient identified Patient Re-evaluated:Patient Re-evaluated prior to induction Oxygen Delivery Method: Circle system utilized Preoxygenation: Pre-oxygenation with 100% oxygen Induction Type: IV induction Ventilation: Mask ventilation without difficulty Laryngoscope Size: Mac and 3 Grade View: Grade I Tube type: Oral Tube size: 8.0 mm Number of attempts: 1 Placement Confirmation: ETT inserted through vocal cords under direct vision,  positive ETCO2 and breath sounds checked- equal and bilateral Secured at: 21 cm Tube secured with: Tape Dental Injury: Teeth and Oropharynx as per pre-operative assessment

## 2019-10-03 NOTE — Transfer of Care (Signed)
Immediate Anesthesia Transfer of Care Note  Patient: Courtney Gardner  Procedure(s) Performed: MEDIASTINAL WASHOUT (N/A Chest)  Patient Location: SICU and Endoscopy Unit  Anesthesia Type:General  Level of Consciousness: Patient remains intubated per anesthesia plan  Airway & Oxygen Therapy: Patient remains intubated per anesthesia plan and Patient placed on Ventilator (see vital sign flow sheet for setting)  Post-op Assessment: Report given to RN and Post -op Vital signs reviewed and stable  Post vital signs: Reviewed and stable  Last Vitals:  Vitals Value Taken Time  BP 92/33 10/03/19 1000  Temp    Pulse 46 10/03/19 1004  Resp 14 10/03/19 1000  SpO2 100 % 10/03/19 1004  Vitals shown include unvalidated device data.  Last Pain:  Vitals:   10/03/19 0800  TempSrc:   PainSc: 0-No pain         Complications: No apparent anesthesia complications

## 2019-10-03 NOTE — Discharge Summary (Addendum)
Physician Discharge Summary  Patient ID: Courtney Gardner MRN: [id number] DOB/AGE: 1950/12/18 69 y.o.  Admit date: 09/30/2019 Discharge date: 10/08/2019  Admission Diagnoses: Angina pectoris  Discharge Diagnoses:  Active Problems:   Angina pectoris ([place])   Diabetes ([place])   Glaucoma   Barrett's esophagus   Renal calculi   Patient Active Problem List   Diagnosis Date Noted  . Diabetes ([place]) 09/30/2019  . Glaucoma 09/30/2019  . Barrett's esophagus 09/30/2019  . Renal calculi 09/30/2019  . Palpitations 08/12/2019  . Coronary artery disease involving native coronary artery of native heart 12/26/2017  . Hyperlipidemia 11/20/2017  . Angina pectoris ([place]) 11/20/2017  . Ex-smoker 11/20/2017  . Iliotibial band syndrome of left side 09/09/2015   History of Present Illness: at time of consultation     We are asked to see this 69 year old female in cardiothoracic surgical consultation for consideration of coronary artery surgical revascularization.  Patient has a previous medical history inclusive of nonobstructive coronary artery disease, essential hypertension and dyslipidemia.  She has had a stress test which did not reveal any delayed evidence of ischemia however she has had symptoms of chest pain suggestive of angina and a CT angiogram was ordered.  This revealed significant obstructive disease especially of the left main coronary artery.  Cardiology then recommended proceeding with cardiac catheterization and this is performed on today's date.  Please see the full report listed below.  Due to these findings we are consulted for consideration of proceeding with CABG as her best  revascularization option due to the severity of the anatomical findings and increasing nature of her symptoms.  Patient was seen in consultation by Dr. Kipp Brood who evaluated the patient and studies who recommended proceeding with CABG as best revascularization option at this time.   Discharged Condition:  stable  Hospital Course: Patient underwent cardiac catheterization as noted above and was subsequently scheduled for surgery.  On 10/02/2019 the patient was taken to the operating room where she underwent the below described procedure.  She tolerated well was taken to the surgical intensive care unit in stable condition.  Postoperative hospital course:  The patient has remained stable however, on the night of surgery she continued to have a significant amount of drainage from the chest tubes and it was felt that she would require reexploration which was done on the morning of 10/03/2019 by Dr. Kipp Brood.  No specific bleeding sites were noted, and she was washed out and  she was returned to the SICU in stable condition. Pacing wires were removed on 06/05. Patient was volume overloaded and diuresed. Foley and central line were removed on 06/06. She went into a fib with RVR. She was given Amiodarone and already on Lopressor. She was more hypertensive so Lisinopril was started and titrated to her home dose.  She converted to NSR and was transitioned to an oral regimen of Amiodarone.  She developed hypokalemia and was supplemented accordingly.  The patient remained clinically stable.  She continues to be hypertensive at times, but his is likely due to her renal artery stenosis.  She is ambulating without significant difficulty.  Her incisions are healing without evidence of infection.  She had intermittent bradycardia so Lopressor was decreased to 37.5 mg bid 06/08. She was more hypertensive 06/09. Her creatinine was slightly elevated at 1.21 so Lisinopril was not increased; Amlodipine was started and Dr. Kipp Brood, increased Lopressor back to 50 mg bid. Dr. Kipp Brood asked Dr. Oval Linsey to evaluate and no changes were made to her hypertensive  medications. A follow up appointment was made with Dr. Dimas Alexandria assistant regarding further management of blood pressure medication. She will require several more days of  diuresis. She is felt surgically stable for discharge for discharge today.   Consults: cardiology  Significant Diagnostic Studies: angiography: cardiac cath  Treatments: surgery:   10/02/2019 Patient:  Courtney Gardner Pre-Op Dx: left main, 2V CAD                           HTN                         DM Post-op Dx:  same Procedure: CABG X 2.  LIMA LAD, RSVG OM1   Endoscopic greater saphenous vein harvest on the right Intra-operative Transesophageal Echocardiogram  Surgeon and Role:      * Lightfoot, Lucile Crater, MD - Primary Assistant: Leretha Pol, PA-C  Anesthesia  general EBL:  273ml Blood Administration: none Xclamp Time:  42 min Pump Time:  104min  Drains: 19 F blake drain:  R, L, mediastinal  Wires: none Counts: correct Discharge Exam: Blood pressure (!) 159/72, pulse 94, temperature 98.3 F (36.8 C), temperature source Oral, resp. rate 18, height 5\' 6"  (1.676 m), weight 68.3 kg, SpO2 95 %.   Cardiovascular: RRR Pulmonary: Clear to auscultation bilaterally Abdomen: Soft, non tender, bowel sounds present. Extremities: Trace bilateral lower extremity edema. Wounds: Clean and dry.  No erythema or signs of infection.   Discharge medications:   The patient has been discharged on:   1.Beta Blocker:  Yes [ X  ]                              No   [   ]                              If No, reason:  2.Ace Inhibitor/ARB: Yes [ X  ]                                     No  [    ]                                     If No, reason:  3.Statin:   Yes [ X  ]                  No  [   ]                  If No, reason:  4.Ecasa:  Yes  [ X  ]                  No   [   ]                  If No, reason:  Allergies as of 10/08/2019   No Known Allergies     Medication List    STOP taking these medications   isosorbide mononitrate 60 MG 24 hr tablet Commonly known as: IMDUR     TAKE these medications   acetaminophen 500 MG tablet Commonly known as: TYLENOL Take 2  tablets (1,000 mg total) by mouth every 6 (six) hours as needed.   ALPRAZolam 0.25 MG tablet Commonly known as: XANAX Take 1 tablet (0.25 mg total) by mouth at bedtime as needed for anxiety.   amiodarone 200 MG tablet Commonly known as: PACERONE Take 1 tablet (200 mg total) by mouth daily.   amLODipine 10 MG tablet Commonly known as: NORVASC Take 1 tablet (10 mg total) by mouth daily.   aspirin EC 81 MG tablet Take 1 tablet (81 mg total) by mouth daily.   atorvastatin 40 MG tablet Commonly known as: LIPITOR Take 40 mg by mouth at bedtime.   cyclobenzaprine 5 MG tablet Commonly known as: FLEXERIL Take 1 tablet by mouth daily as needed for muscle spasms.   furosemide 40 MG tablet Commonly known as: LASIX Take 1 tablet (40 mg total) by mouth daily. For 5 days then stop.   levothyroxine 50 MCG tablet Commonly known as: SYNTHROID Take 50 mcg by mouth daily before breakfast.   lisinopril 20 MG tablet Commonly known as: ZESTRIL Take 20 mg by mouth daily.   metFORMIN 500 MG tablet Commonly known as: GLUCOPHAGE Take 500 mg by mouth at bedtime.   metoprolol tartrate 50 MG tablet Commonly known as: LOPRESSOR Take 1 tablet (50 mg total) by mouth 2 (two) times daily. What changed:   medication strength  how much to take   nitroGLYCERIN 0.4 MG SL tablet Commonly known as: NITROSTAT Place 1 tablet (0.4 mg total) under the tongue every 5 (five) minutes as needed for chest pain.   omeprazole 40 MG capsule Commonly known as: PRILOSEC Take 40 mg by mouth daily.   PARoxetine 10 MG tablet Commonly known as: PAXIL Take 10 mg by mouth daily.   potassium chloride SA 20 MEQ tablet Commonly known as: KLOR-CON Take 1 tablet (20 mEq total) by mouth daily. For 5 days then stop.   traMADol 50 MG tablet Commonly known as: ULTRAM Take 1 tablet (50 mg total) by mouth every 6 (six) hours as needed for moderate pain.            Durable Medical Equipment  (From admission,  onward)         Start     Ordered   10/07/19 0738  For home use only DME Shower stool  Once     10/07/19 0737   10/07/19 0737  For home use only DME Walker rolling  Once    Question Answer Comment  Walker: With 5 Inch Wheels   Patient needs a walker to treat with the following condition S/P CABG x 2      10/07/19 0737         Follow-up Information    [name], MD. Go on 10/24/2019.   Specialty: Cardiothoracic Surgery Why: Appointment time is at 1:15 pm Contact information: [place] [address] [phone]        Jenean Lindau, MD Follow up on 10/16/2019.   Specialty: Cardiology Why: Appointment time is at 3:35 pm Contact information: [place] [address] [phone]        Marguerita Merles, MD. Call.   Specialty: Family Medicine Why: for a follow up appointment regarding further diabetes management and HGA1C 7.7 Contact information: [address] [place] [address] [place], [place] [facility] Follow up.   Why: rolling walker and shower stool arranged-  to be delivered to room prior to discharge Contact information: [address] [place] [address] [phone]        Deberah Pelton, NP. Go on 11/15/2019.   Specialty: Cardiology Why: Appointment time is at 3:45 pm. Please check BMET as well Contact information: [address] [place] [address] [phone]          Signed: Arnoldo Lenis 10/08/2019, 9:47 AM

## 2019-10-03 NOTE — Op Note (Signed)
      [address]       [place] [address]             [phone]        10/03/2019  Patient:  Courtney Gardner Pre-Op Dx: s/p CABG   Hypovolemic shock   Significant Chest tube output   Post-op Dx:  same Procedure: - Mediastinal Washout   Surgeon and Role:      * Mattalynn Crandle, Lucile Crater, MD - Primary  Anesthesia  general EBL:  Minimal  Blood Administration: 2 units of blood Specimen:  none   Counts: correct   Indications: 69 yo female POD 1 s/p CABG.  She had intermittent chest tube output overnight.  She remained hemodynamically stable, but due to cumulative output, I elected to bring her back for an exploration   Findings: No obvious source of bleeding.  Some clot noted around the LIMA conduit.  Operative Technique: After the risks, benefits and alternatives were thoroughly discussed, the patient was brought to the operative theatre.  Anesthesia was induced, and the patient was prepped and draped in normal sterile fashion.  An appropriate surgical pause was performed, and pre-operative antibiotics were dosed accordingly.  The previous sternotomy was re-opened.  There was no significant clot in the anterior mediastinum.  All anastomoses were checked.  There was some old clot around the LIMA conduit.  No obvious source of bleeding was noted. The drains were replaced, and sternum was reapproximated with wires.  The skin and soft tissue were closed with absorbable suture    The patient tolerated the procedure without any immediate complications, and was transferred to the ICU in stable condition.  Rana Hochstein Bary Leriche

## 2019-10-03 NOTE — Progress Notes (Signed)
  Amiodarone Drug - Drug Interaction Consult Note  Recommendations: -Watch QTc with Paxil  Amiodarone is metabolized by the cytochrome P450 system and therefore has the potential to cause many drug interactions. Amiodarone has an average plasma half-life of 50 days (range 20 to 100 days).   There is potential for drug interactions to occur several weeks or months after stopping treatment and the onset of drug interactions may be slow after initiating amiodarone.   []  Statins: Increased risk of myopathy. Simvastatin- restrict dose to 20mg  daily. Other statins: counsel patients to report any muscle pain or weakness immediately.  []  Anticoagulants: Amiodarone can increase anticoagulant effect. Consider warfarin dose reduction. Patients should be monitored closely and the dose of anticoagulant altered accordingly, remembering that amiodarone levels take several weeks to stabilize.  []  Antiepileptics: Amiodarone can increase plasma concentration of phenytoin, the dose should be reduced. Note that small changes in phenytoin dose can result in large changes in levels. Monitor patient and counsel on signs of toxicity.  [x]  Beta blockers: increased risk of bradycardia, AV block and myocardial depression. Sotalol - avoid concomitant use.  []   Calcium channel blockers (diltiazem and verapamil): increased risk of bradycardia, AV block and myocardial depression.  []   Cyclosporine: Amiodarone increases levels of cyclosporine. Reduced dose of cyclosporine is recommended.  []  Digoxin dose should be halved when amiodarone is started.  []  Diuretics: increased risk of cardiotoxicity if hypokalemia occurs.  []  Oral hypoglycemic agents (glyburide, glipizide, glimepiride): increased risk of hypoglycemia. Patient's glucose levels should be monitored closely when initiating amiodarone therapy.   [x]  Drugs that prolong the QT interval:  Torsades de pointes risk may be increased with concurrent use - avoid if  possible.  Monitor QTc, also keep magnesium/potassium WNL if concurrent therapy can't be avoided. Marland Kitchen Antibiotics: e.g. fluoroquinolones, erythromycin. . Antiarrhythmics: e.g. quinidine, procainamide, disopyramide, sotalol. . Antipsychotics: e.g. phenothiazines, haloperidol.  . Lithium, tricyclic antidepressants, and methadone. Thank Concha Pyo  10/03/2019 9:10 PM

## 2019-10-03 NOTE — Plan of Care (Signed)
  Problem: Education: Goal: Understanding of CV disease, CV risk reduction, and recovery process will improve Outcome: Completed/Met Goal: Individualized Educational Video(s) Outcome: Completed/Met   Problem: Cardiovascular: Goal: Ability to achieve and maintain adequate cardiovascular perfusion will improve Outcome: Progressing Goal: Vascular access site(s) Level 0-1 will be maintained Outcome: Completed/Met   Problem: Health Behavior/Discharge Planning: Goal: Ability to safely manage health-related needs after discharge will improve Outcome: Completed/Met   Problem: Education: Goal: Will demonstrate proper wound care and an understanding of methods to prevent future damage Outcome: Progressing Goal: Knowledge of disease or condition will improve Outcome: Progressing Goal: Knowledge of the prescribed therapeutic regimen will improve Outcome: Progressing Goal: Individualized Educational Video(s) Outcome: Progressing   Problem: Activity: Goal: Risk for activity intolerance will decrease Outcome: Progressing   Problem: Cardiac: Goal: Will achieve and/or maintain hemodynamic stability Outcome: Progressing   Problem: Clinical Measurements: Goal: Postoperative complications will be avoided or minimized Outcome: Progressing   Problem: Respiratory: Goal: Respiratory status will improve Outcome: Completed/Met   Problem: Skin Integrity: Goal: Wound healing without signs and symptoms of infection Outcome: Progressing   Problem: Skin Integrity: Goal: Risk for impaired skin integrity will decrease Outcome: Progressing   Problem: Urinary Elimination: Goal: Ability to achieve and maintain adequate renal perfusion and functioning will improve Outcome: Progressing

## 2019-10-03 NOTE — Anesthesia Preprocedure Evaluation (Signed)
Anesthesia Evaluation  Patient identified by MRN, date of birth, ID band Patient awake    Reviewed: Allergy & Precautions, H&P , NPO status , Patient's Chart, lab work & pertinent test results  Airway Mallampati: II   Neck ROM: full    Dental   Pulmonary former smoker,    breath sounds clear to auscultation       Cardiovascular hypertension, + CAD and + CABG   Rhythm:regular Rate:Normal  POD #1 s/p CABG.  Hemoglobin 5.4 today.   Neuro/Psych    GI/Hepatic   Endo/Other  diabetes, Type 2  Renal/GU      Musculoskeletal   Abdominal   Peds  Hematology   Anesthesia Other Findings   Reproductive/Obstetrics                             Anesthesia Physical Anesthesia Plan  ASA: III and emergent  Anesthesia Plan: General   Post-op Pain Management:    Induction: Intravenous  PONV Risk Score and Plan: 3 and Ondansetron, Dexamethasone, Midazolam and Treatment may vary due to age or medical condition  Airway Management Planned: Oral ETT  Additional Equipment: Arterial line, CVP and TEE  Intra-op Plan:   Post-operative Plan: Post-operative intubation/ventilation  Informed Consent: I have reviewed the patients History and Physical, chart, labs and discussed the procedure including the risks, benefits and alternatives for the proposed anesthesia with the patient or authorized representative who has indicated his/her understanding and acceptance.       Plan Discussed with: CRNA, Anesthesiologist and Surgeon  Anesthesia Plan Comments:         Anesthesia Quick Evaluation

## 2019-10-03 NOTE — Progress Notes (Signed)
CT surgery p.m. rounds  Blood pressure (!) 130/59, pulse 96, temperature 100 F (37.8 C), resp. rate 16, height 5\' 6"  (1.676 m), weight 68 kg, SpO2 98 %. Doing well after CABG yesterday Chest tube output minimal, p.m. hemoglobin 11.9, stable

## 2019-10-03 NOTE — Anesthesia Postprocedure Evaluation (Signed)
Anesthesia Post Note  Patient: Courtney Gardner  Procedure(s) Performed: MEDIASTINAL WASHOUT (N/A Chest)     Patient location during evaluation: SICU Anesthesia Type: General Level of consciousness: sedated Pain management: pain level controlled Vital Signs Assessment: post-procedure vital signs reviewed and stable Respiratory status: patient remains intubated per anesthesia plan Cardiovascular status: stable Postop Assessment: no apparent nausea or vomiting Anesthetic complications: no    Last Vitals:  Vitals:   10/03/19 1245 10/03/19 1300  BP:  128/68  Pulse: (!) 47 (!) 52  Resp: 12 12  Temp: (!) 35.7 C (!) 35.9 C  SpO2: 100% 100%    Last Pain:  Vitals:   10/03/19 0800  TempSrc: Bladder  PainSc: 0-No pain                 Pricila Bridge DANIEL

## 2019-10-03 NOTE — Procedures (Signed)
Extubation Procedure Note  Patient Details:   Name: Courtney Gardner DOB: 11-14-1950 MRN: [id number]   Airway Documentation:    Vent end date: 10/03/19 Vent end time: 1428   Evaluation  O2 sats: stable throughout Complications: No apparent complications Patient did tolerate procedure well. Bilateral Breath Sounds: Clear, Diminished   Yes   Patient was extubated to a 4L [place] without any complications, dyspnea or stridor noted. NIF: -26, VC: .9L, positive cuff leak.   Kellyann Ordway, Eddie North 10/03/2019, 2:28 PM

## 2019-10-03 NOTE — Anesthesia Postprocedure Evaluation (Signed)
Anesthesia Post Note  Patient: Courtney Gardner  Procedure(s) Performed: CORONARY ARTERY BYPASS GRAFTING (CABG) TIMES TWO, ON PUMP, USING LEFT INTERNAL MAMMARY ARTERY AND RIGHT GREATER SAPHENOUS VEIN HARVESTED ENDOSCOPICALLY (N/A Chest) TRANSESOPHAGEAL ECHOCARDIOGRAM (TEE) (N/A )     Patient location during evaluation: SICU Anesthesia Type: General Level of consciousness: sedated Pain management: pain level controlled Vital Signs Assessment: post-procedure vital signs reviewed and stable Respiratory status: patient remains intubated per anesthesia plan Cardiovascular status: stable Postop Assessment: no apparent nausea or vomiting Anesthetic complications: no    Last Vitals:  Vitals:   10/03/19 0745 10/03/19 0800  BP:  (!) 141/68  Pulse: 73 65  Resp: 14 17  Temp: 36.8 C   SpO2: 100% 100%    Last Pain:  Vitals:   10/03/19 0800  TempSrc:   PainSc: 0-No pain                 Square Jowett S

## 2019-10-03 NOTE — Progress Notes (Signed)
     [address]       [place] [address]             [phone]       Significant CT output overnight coags corrected Hemodynamically stable, but H/H dropped and CVP 2-3  Will take to OR for mediastinal washout.  Courtney Gardner

## 2019-10-04 LAB — BASIC METABOLIC PANEL
Anion gap: 6 (ref 5–15)
BUN: 9 mg/dL (ref 8–23)
CO2: 25 mmol/L (ref 22–32)
Calcium: 7.9 mg/dL — ABNORMAL LOW (ref 8.9–10.3)
Chloride: 107 mmol/L (ref 98–111)
Creatinine, Ser: 0.9 mg/dL (ref 0.44–1.00)
GFR calc Af Amer: >60 mL/min (ref >60)
GFR calc non Af Amer: >60 mL/min (ref >60)
Glucose, Bld: 123 mg/dL — ABNORMAL HIGH (ref 70–99)
Potassium: 3.5 mmol/L (ref 3.5–5.1)
Sodium: 138 mmol/L (ref 135–145)

## 2019-10-04 LAB — GLUCOSE, CAPILLARY
Glucose-Capillary: 111 mg/dL — ABNORMAL HIGH (ref 70–99)
Glucose-Capillary: 113 mg/dL — ABNORMAL HIGH (ref 70–99)
Glucose-Capillary: 115 mg/dL — ABNORMAL HIGH (ref 70–99)
Glucose-Capillary: 116 mg/dL — ABNORMAL HIGH (ref 70–99)
Glucose-Capillary: 118 mg/dL — ABNORMAL HIGH (ref 70–99)
Glucose-Capillary: 126 mg/dL — ABNORMAL HIGH (ref 70–99)
Glucose-Capillary: 130 mg/dL — ABNORMAL HIGH (ref 70–99)
Glucose-Capillary: 137 mg/dL — ABNORMAL HIGH (ref 70–99)
Glucose-Capillary: 148 mg/dL — ABNORMAL HIGH (ref 70–99)
Glucose-Capillary: 98 mg/dL (ref 70–99)

## 2019-10-04 LAB — CBC
HCT: 35.5 % — ABNORMAL LOW (ref 36.0–46.0)
Hemoglobin: 11.9 g/dL — ABNORMAL LOW (ref 12.0–15.0)
MCH: 29.8 pg (ref 26.0–34.0)
MCHC: 33.5 g/dL (ref 30.0–36.0)
MCV: 89 fL (ref 80.0–100.0)
Platelets: 103 10*3/uL — ABNORMAL LOW (ref 150–400)
RBC: 3.99 MIL/uL (ref 3.87–5.11)
RDW: 14.4 % (ref 11.5–15.5)
WBC: 14.8 10*3/uL — ABNORMAL HIGH (ref 4.0–10.5)
nRBC: 0 % (ref 0.0–0.2)

## 2019-10-04 MED ORDER — PROMETHAZINE HCL 25 MG/ML IJ SOLN: 6.2500 mg | Freq: Three times a day (TID) | INTRAMUSCULAR | Status: DC | PRN

## 2019-10-04 MED ORDER — METOPROLOL TARTRATE 12.5 MG HALF TABLET
12.5000 mg | ORAL_TABLET | Freq: Two times a day (BID) | ORAL | Status: DC
  Administered 2019-10-05: 12.5 mg via ORAL
  Filled 2019-10-04 (×2): qty 1

## 2019-10-04 MED ORDER — LEVOTHYROXINE SODIUM 50 MCG PO TABS: 50.0000 ug | ORAL_TABLET | Freq: Every day | ORAL | Status: DC

## 2019-10-04 MED ORDER — POTASSIUM CHLORIDE 10 MEQ/50ML IV SOLN
10.0000 meq | INTRAVENOUS | Status: AC
  Administered 2019-10-04 (×3): 10 meq via INTRAVENOUS
  Filled 2019-10-04 (×3): qty 50

## 2019-10-04 MED ORDER — METOPROLOL TARTRATE 25 MG/10 ML ORAL SUSPENSION: 12.5000 mg | Freq: Two times a day (BID) | ORAL | Status: DC

## 2019-10-04 MED ORDER — AMIODARONE HCL 200 MG PO TABS
200.0000 mg | ORAL_TABLET | Freq: Every day | ORAL | Status: DC
  Administered 2019-10-05 – 2019-10-08 (×4): 200 mg via ORAL
  Filled 2019-10-04 (×4): qty 1

## 2019-10-04 MED ORDER — ATORVASTATIN CALCIUM 10 MG PO TABS: 20.0000 mg | ORAL_TABLET | Freq: Every day | ORAL | Status: DC

## 2019-10-04 MED ORDER — LISINOPRIL 10 MG PO TABS
10.0000 mg | ORAL_TABLET | Freq: Every day | ORAL | Status: DC
  Administered 2019-10-04 – 2019-10-05 (×2): 10 mg via ORAL
  Filled 2019-10-04 (×2): qty 1

## 2019-10-04 MED ORDER — OXYCODONE HCL 5 MG PO TABS: 5.0000 mg | ORAL_TABLET | ORAL | Status: DC | PRN

## 2019-10-04 MED ORDER — PAROXETINE HCL 10 MG PO TABS: 10.0000 mg | ORAL_TABLET | Freq: Every day | ORAL | Status: DC

## 2019-10-04 MED ORDER — INSULIN ASPART 100 UNIT/ML ~~LOC~~ SOLN
0.0000 [IU] | Freq: Three times a day (TID) | SUBCUTANEOUS | Status: DC
  Administered 2019-10-04 – 2019-10-06 (×4): 1 [IU] via SUBCUTANEOUS

## 2019-10-04 MED ORDER — METOCLOPRAMIDE HCL 5 MG/ML IJ SOLN
10.0000 mg | Freq: Four times a day (QID) | INTRAMUSCULAR | Status: DC
  Administered 2019-10-04 – 2019-10-07 (×12): 10 mg via INTRAVENOUS
  Filled 2019-10-04 (×13): qty 2

## 2019-10-04 NOTE — Progress Notes (Signed)
1 Day Post-Op Procedure(s) (LRB): MEDIASTINAL WASHOUT (N/A) Subjective: Doing well back in nsr Nausea from pain meds  Objective: Vital signs in last 24 hours: Temp:  [95.4 F (35.2 C)-100.2 F (37.9 C)] 98.6 F (37 C) (06/05 0630) Pulse Rate:  [44-151] 77 (06/05 1100) Cardiac Rhythm: Sinus bradycardia (06/05 0800) Resp:  [9-37] 32 (06/05 1100) BP: (108-186)/(46-90) 135/64 (06/05 1100) SpO2:  [88 %-100 %] 88 % (06/05 1100) Arterial Line BP: (115-207)/(40-80) 185/80 (06/05 1100) FiO2 (%):  [40 %-50 %] 40 % (06/04 1355) Weight:  [75.2 kg] 75.2 kg (06/05 0630)  Hemodynamic parameters for last 24 hours: CVP:  [0 mmHg-34 mmHg] 34 mmHg  Intake/Output from previous day: 06/04 0701 - 06/05 0700 In: 4901.9 [P.O.:420; I.V.:3142.3; Blood:945; IV Piggyback:394.5] Out: 2620 [Urine:1370; Blood:200; Chest Tube:750] Intake/Output this shift: Total I/O In: 488.7 [P.O.:100; I.V.:223.3; IV Piggyback:165.4] Out: 95 [Urine:25; Emesis/NG output:60; Chest Tube:10]       Exam    General- alert and comfortable    Neck- no JVD, no cervical adenopathy palpable, no carotid bruit   Lungs- clear without rales, wheezes   Cor- regular rate and rhythm, no murmur , gallop   Abdomen- soft, non-tender   Extremities - warm, non-tender, minimal edema   Neuro- oriented, appropriate, no focal weakness   Lab Results: Recent Labs    10/03/19 1600 10/03/19 1600 10/03/19 1604 10/04/19 0445  WBC 12.8*  --   --  14.8*  HGB 12.8   < > 11.9* 11.9*  HCT 37.6   < > 35.0* 35.5*  PLT 101*  --   --  103*   < > = values in this interval not displayed.   BMET:  Recent Labs    10/03/19 1600 10/03/19 1600 10/03/19 1604 10/04/19 0445  NA 139   < > 143 138  K 4.3   < > 4.3 3.5  CL 109  --   --  107  CO2 21*  --   --  25  GLUCOSE 102*  --   --  123*  BUN 11  --   --  9  CREATININE 0.95  --   --  0.90  CALCIUM 8.0*  --   --  7.9*   < > = values in this interval not displayed.    PT/INR:  Recent Labs    10/03/19 0405  LABPROT 14.1  INR 1.1   ABG    Component Value Date/Time   PHART 7.360 10/03/2019 1604   HCO3 22.4 10/03/2019 1604   TCO2 24 10/03/2019 1604   ACIDBASEDEF 3.0 (H) 10/03/2019 1604   O2SAT 95.0 10/03/2019 1604   CBG (last 3)  Recent Labs    10/04/19 0741 10/04/19 1013 10/04/19 1134  GLUCAP 126* 137* 130*    Assessment/Plan: S/P Procedure(s) (LRB): MEDIASTINAL WASHOUT (N/A) Mobilize Diuresis Diabetes control d/c pacing wires See progression orders   LOS: 4 days    Tharon Aquas Trigt III 10/04/2019

## 2019-10-04 NOTE — Plan of Care (Signed)
  Problem: Cardiovascular: Goal: Ability to achieve and maintain adequate cardiovascular perfusion will improve Outcome: Progressing   Problem: Education: Goal: Knowledge of General Education information will improve Description: Including pain rating scale, medication(s)/side effects and non-pharmacologic comfort measures Outcome: Progressing   Problem: Health Behavior/Discharge Planning: Goal: Ability to manage health-related needs will improve Outcome: Progressing   Problem: Clinical Measurements: Goal: Ability to maintain clinical measurements within normal limits will improve Outcome: Progressing Goal: Will remain free from infection Outcome: Progressing Goal: Diagnostic test results will improve Outcome: Progressing Goal: Respiratory complications will improve Outcome: Progressing Goal: Cardiovascular complication will be avoided Outcome: Progressing   Problem: Activity: Goal: Risk for activity intolerance will decrease Outcome: Progressing   Problem: Nutrition: Goal: Adequate nutrition will be maintained Outcome: Progressing   Problem: Coping: Goal: Level of anxiety will decrease Outcome: Progressing   Problem: Elimination: Goal: Will not experience complications related to bowel motility Outcome: Progressing Goal: Will not experience complications related to urinary retention Outcome: Progressing   Problem: Pain Managment: Goal: General experience of comfort will improve Outcome: Progressing   Problem: Safety: Goal: Ability to remain free from injury will improve Outcome: Progressing   Problem: Skin Integrity: Goal: Risk for impaired skin integrity will decrease Outcome: Progressing   Problem: Education: Goal: Will demonstrate proper wound care and an understanding of methods to prevent future damage Outcome: Progressing Goal: Knowledge of disease or condition will improve Outcome: Progressing Goal: Knowledge of the prescribed therapeutic regimen  will improve Outcome: Progressing Goal: Individualized Educational Video(s) Outcome: Progressing   Problem: Activity: Goal: Risk for activity intolerance will decrease Outcome: Progressing   Problem: Cardiac: Goal: Will achieve and/or maintain hemodynamic stability Outcome: Progressing   Problem: Clinical Measurements: Goal: Postoperative complications will be avoided or minimized Outcome: Progressing   Problem: Skin Integrity: Goal: Wound healing without signs and symptoms of infection Outcome: Progressing Goal: Risk for impaired skin integrity will decrease Outcome: Progressing   Problem: Urinary Elimination: Goal: Ability to achieve and maintain adequate renal perfusion and functioning will improve Outcome: Progressing

## 2019-10-04 NOTE — Progress Notes (Signed)
CT surgery p.m. Rounds  Nausea improved Diuresing well Sugars controlled Up to chair today with short walk We will DC Foley and central line tomorrow

## 2019-10-05 ENCOUNTER — Inpatient Hospital Stay (HOSPITAL_COMMUNITY): Payer: Medicare Other

## 2019-10-05 LAB — TYPE AND SCREEN
ABO/RH(D): O POS
Antibody Screen: NEGATIVE
Unit division: 0
Unit division: 0
Unit division: 0
Unit division: 0
Unit division: 0
Unit division: 0
Unit division: 0

## 2019-10-05 LAB — BPAM RBC
Blood Product Expiration Date: 202106292359
Blood Product Expiration Date: 202106292359
Blood Product Expiration Date: 202107092359
Blood Product Expiration Date: 202107092359
Blood Product Expiration Date: 202107092359
Blood Product Expiration Date: 202107092359
Blood Product Expiration Date: 202107092359
ISSUE DATE / TIME: 202106032223
ISSUE DATE / TIME: 202106040509
ISSUE DATE / TIME: 202106040509
ISSUE DATE / TIME: 202106040754
ISSUE DATE / TIME: 202106040754
Unit Type and Rh: 5100
Unit Type and Rh: 5100
Unit Type and Rh: 5100
Unit Type and Rh: 5100
Unit Type and Rh: 5100
Unit Type and Rh: 5100
Unit Type and Rh: 5100

## 2019-10-05 LAB — BASIC METABOLIC PANEL
Anion gap: 8 (ref 5–15)
BUN: 12 mg/dL (ref 8–23)
CO2: 25 mmol/L (ref 22–32)
Calcium: 8.1 mg/dL — ABNORMAL LOW (ref 8.9–10.3)
Chloride: 106 mmol/L (ref 98–111)
Creatinine, Ser: 0.98 mg/dL (ref 0.44–1.00)
GFR calc Af Amer: >60 mL/min (ref >60)
GFR calc non Af Amer: 59 mL/min — ABNORMAL LOW (ref >60)
Glucose, Bld: 120 mg/dL — ABNORMAL HIGH (ref 70–99)
Potassium: 4.1 mmol/L (ref 3.5–5.1)
Sodium: 139 mmol/L (ref 135–145)

## 2019-10-05 LAB — CBC
HCT: 35.9 % — ABNORMAL LOW (ref 36.0–46.0)
Hemoglobin: 11.7 g/dL — ABNORMAL LOW (ref 12.0–15.0)
MCH: 30 pg (ref 26.0–34.0)
MCHC: 32.6 g/dL (ref 30.0–36.0)
MCV: 92.1 fL (ref 80.0–100.0)
Platelets: 133 10*3/uL — ABNORMAL LOW (ref 150–400)
RBC: 3.9 MIL/uL (ref 3.87–5.11)
RDW: 14.4 % (ref 11.5–15.5)
WBC: 14.4 10*3/uL — ABNORMAL HIGH (ref 4.0–10.5)
nRBC: 0 % (ref 0.0–0.2)

## 2019-10-05 LAB — GLUCOSE, CAPILLARY
Glucose-Capillary: 119 mg/dL — ABNORMAL HIGH (ref 70–99)
Glucose-Capillary: 143 mg/dL — ABNORMAL HIGH (ref 70–99)
Glucose-Capillary: 107 mg/dL — ABNORMAL HIGH (ref 70–99)
Glucose-Capillary: 123 mg/dL — ABNORMAL HIGH (ref 70–99)

## 2019-10-05 MED ORDER — AMIODARONE HCL IN DEXTROSE 360-4.14 MG/200ML-% IV SOLN
INTRAVENOUS | Status: AC
  Administered 2019-10-05: 30 mg/h via INTRAVENOUS
  Filled 2019-10-05: qty 200

## 2019-10-05 MED ORDER — LISINOPRIL 10 MG PO TABS
20.0000 mg | ORAL_TABLET | Freq: Every day | ORAL | Status: DC
  Administered 2019-10-06 – 2019-10-08 (×3): 20 mg via ORAL
  Filled 2019-10-05 (×2): qty 2
  Filled 2019-10-05: qty 1

## 2019-10-05 MED ORDER — METOPROLOL TARTRATE 25 MG PO TABS
25.0000 mg | ORAL_TABLET | Freq: Two times a day (BID) | ORAL | Status: DC
  Administered 2019-10-05 – 2019-10-06 (×2): 25 mg via ORAL
  Filled 2019-10-05 (×2): qty 1

## 2019-10-05 MED ORDER — FUROSEMIDE 40 MG PO TABS
40.0000 mg | ORAL_TABLET | Freq: Every day | ORAL | Status: DC
  Administered 2019-10-05 – 2019-10-07 (×3): 40 mg via ORAL
  Filled 2019-10-05 (×2): qty 1

## 2019-10-05 MED ORDER — AMIODARONE IV BOLUS ONLY 150 MG/100ML: 150.0000 mg | Freq: Once | INTRAVENOUS | Status: AC

## 2019-10-05 MED ORDER — LISINOPRIL 10 MG PO TABS
10.0000 mg | ORAL_TABLET | Freq: Once | ORAL | Status: AC
  Administered 2019-10-05: 10 mg via ORAL
  Filled 2019-10-05: qty 1

## 2019-10-05 NOTE — Progress Notes (Signed)
Pt ambulated 25 feet with 4 wheel walker and became short of breath (sats in 90's on 2 liters), heart rate increased to 140 and converted to afib . Pt assisted back to bed.BP in the 712'J systolic. I administered scheduled amiodorone,metoprolol, and lisinispril. Dr. Nils Pyle aware and ordered 150 cc amio bolus. Continue to monitor.  Ellamae Sia

## 2019-10-05 NOTE — Progress Notes (Signed)
CT surgery p.m. Rounds  Blood pressure (!) 168/64, pulse 88, temperature 98.5 F (36.9 C), resp. rate (!) 22, height 5\' 6"  (1.676 m), weight 74.4 kg, SpO2 91 %.  Blood pressure increased-we will increase to home dose of lisinopril p.o. and increase metoprolol 25 mg twice daily

## 2019-10-05 NOTE — Progress Notes (Signed)
2 Days Post-Op Procedure(s) (LRB): MEDIASTINAL WASHOUT (N/A) Subjective: Patient feeling better today back in sinus rhythm after brief atrial fibrillation during walk in hallway nausea improved but still generally weak and needs significant assistance remove Foley catheter and central line new oral amiodarone Lasix 40 mg daily-weight up 10 pounds  Objective: Vital signs in last 24 hours: Temp:  [98.4 F (36.9 C)-99 F (37.2 C)] 98.6 F (37 C) (06/06 1133) Pulse Rate:  [71-125] 124 (06/06 0845) Cardiac Rhythm: Atrial fibrillation (06/06 0845) Resp:  [11-24] 19 (06/06 0845) BP: (112-185)/(51-77) 149/76 (06/06 0845) SpO2:  [90 %-100 %] 98 % (06/06 0845) Weight:  [74.4 kg] 74.4 kg (06/06 0500)  Hemodynamic parameters for last 24 hours:    Intake/Output from previous day: 06/05 0701 - 06/06 0700 In: 1038.4 [P.O.:100; I.V.:773.1; IV Piggyback:165.4] Out: 1630 [Urine:1480; Emesis/NG output:60; Chest Tube:90] Intake/Output this shift: No intake/output data recorded.       Exam    General- alert and comfortable    Neck- no JVD, no cervical adenopathy palpable, no carotid bruit   Lungs- clear without rales, wheezes   Cor- regular rate and rhythm, no murmur , gallop   Abdomen- soft, non-tender   Extremities - warm, non-tender, minimal edema   Neuro- oriented, appropriate, no focal weakness   Lab Results: Recent Labs    10/04/19 0445 10/05/19 0412  WBC 14.8* 14.4*  HGB 11.9* 11.7*  HCT 35.5* 35.9*  PLT 103* 133*   BMET:  Recent Labs    10/04/19 0445 10/05/19 0412  NA 138 139  K 3.5 4.1  CL 107 106  CO2 25 25  GLUCOSE 123* 120*  BUN 9 12  CREATININE 0.90 0.98  CALCIUM 7.9* 8.1*    PT/INR:  Recent Labs    10/03/19 0405  LABPROT 14.1  INR 1.1   ABG    Component Value Date/Time   PHART 7.360 10/03/2019 1604   HCO3 22.4 10/03/2019 1604   TCO2 24 10/03/2019 1604   ACIDBASEDEF 3.0 (H) 10/03/2019 1604   O2SAT 95.0 10/03/2019 1604   CBG (last 3)  Recent  Labs    10/04/19 2148 10/05/19 0650 10/05/19 1131  GLUCAP 98 119* 143*    Assessment/Plan: S/P Procedure(s) (LRB): MEDIASTINAL WASHOUT (N/A) Mobilize Diuresis d/c tubes/lines Patient should be ready for progressive care tomorrow   LOS: 5 days    Tharon Aquas Trigt III 10/05/2019

## 2019-10-06 ENCOUNTER — Inpatient Hospital Stay (HOSPITAL_COMMUNITY): Payer: Medicare Other

## 2019-10-06 LAB — GLUCOSE, CAPILLARY
Glucose-Capillary: 93 mg/dL (ref 70–99)
Glucose-Capillary: 132 mg/dL — ABNORMAL HIGH (ref 70–99)
Glucose-Capillary: 124 mg/dL — ABNORMAL HIGH (ref 70–99)
Glucose-Capillary: 118 mg/dL — ABNORMAL HIGH (ref 70–99)

## 2019-10-06 LAB — CBC
HCT: 35.2 % — ABNORMAL LOW (ref 36.0–46.0)
Hemoglobin: 11.5 g/dL — ABNORMAL LOW (ref 12.0–15.0)
MCH: 29.7 pg (ref 26.0–34.0)
MCHC: 32.7 g/dL (ref 30.0–36.0)
MCV: 91 fL (ref 80.0–100.0)
Platelets: 169 10*3/uL (ref 150–400)
RBC: 3.87 MIL/uL (ref 3.87–5.11)
RDW: 13.9 % (ref 11.5–15.5)
WBC: 11.6 10*3/uL — ABNORMAL HIGH (ref 4.0–10.5)
nRBC: 0 % (ref 0.0–0.2)

## 2019-10-06 LAB — BASIC METABOLIC PANEL
Anion gap: 13 (ref 5–15)
BUN: 13 mg/dL (ref 8–23)
CO2: 24 mmol/L (ref 22–32)
Calcium: 8.1 mg/dL — ABNORMAL LOW (ref 8.9–10.3)
Chloride: 105 mmol/L (ref 98–111)
Creatinine, Ser: 1.04 mg/dL — ABNORMAL HIGH (ref 0.44–1.00)
GFR calc Af Amer: >60 mL/min (ref >60)
GFR calc non Af Amer: 55 mL/min — ABNORMAL LOW (ref >60)
Glucose, Bld: 115 mg/dL — ABNORMAL HIGH (ref 70–99)
Potassium: 3.8 mmol/L (ref 3.5–5.1)
Sodium: 142 mmol/L (ref 135–145)

## 2019-10-06 MED ORDER — METOPROLOL TARTRATE 50 MG PO TABS
50.0000 mg | ORAL_TABLET | Freq: Two times a day (BID) | ORAL | Status: DC
  Administered 2019-10-06: 50 mg via ORAL
  Filled 2019-10-06: qty 1

## 2019-10-06 MED ORDER — SODIUM CHLORIDE 0.9% FLUSH
3.0000 mL | Freq: Two times a day (BID) | INTRAVENOUS | Status: DC
  Administered 2019-10-06 – 2019-10-07 (×3): 3 mL via INTRAVENOUS

## 2019-10-06 MED ORDER — SODIUM CHLORIDE 0.9 % IV SOLN: 250.0000 mL | INTRAVENOUS | Status: DC | PRN

## 2019-10-06 MED ORDER — ~~LOC~~ CARDIAC SURGERY, PATIENT & FAMILY EDUCATION: Freq: Once | Status: AC

## 2019-10-06 MED ORDER — SODIUM CHLORIDE 0.9% FLUSH: 3.0000 mL | INTRAVENOUS | Status: DC | PRN

## 2019-10-06 NOTE — Progress Notes (Signed)
[facility] BRIEF SICU PROGRESS NOTE  3 Days Post-Op  S/P Procedure(s) (LRB): MEDIASTINAL WASHOUT (N/A)   Reportedly stable day.  Has ambulated in hall Denies chest pain or SOB NSR w/ somewhat elevated BP Breathing comfortably on room air  Plan: Continue current plan.  Will increase metoprolol dose further  Rexene Alberts, MD 10/06/2019 5:34 PM

## 2019-10-06 NOTE — Plan of Care (Signed)
  Problem: Cardiovascular: Goal: Ability to achieve and maintain adequate cardiovascular perfusion will improve Outcome: Not Progressing   Problem: Clinical Measurements: Goal: Ability to maintain clinical measurements within normal limits will improve Outcome: Not Progressing Goal: Will remain free from infection Outcome: Not Progressing Goal: Diagnostic test results will improve Outcome: Not Progressing Goal: Respiratory complications will improve Outcome: Not Progressing Goal: Cardiovascular complication will be avoided Outcome: Not Progressing   Problem: Activity: Goal: Risk for activity intolerance will decrease Outcome: Not Progressing   Problem: Elimination: Goal: Will not experience complications related to bowel motility Outcome: Not Progressing Goal: Will not experience complications related to urinary retention Outcome: Not Progressing   Problem: Coping: Goal: Level of anxiety will decrease Outcome: Not Progressing   Problem: Safety: Goal: Ability to remain free from injury will improve Outcome: Not Progressing   Problem: Pain Managment: Goal: General experience of comfort will improve Outcome: Not Progressing

## 2019-10-06 NOTE — Progress Notes (Signed)
Patient moved to room 24 on 4E by wheelchair with Melisa, NT. Patient alert with no distress noted and no complaints when leaving 2H.

## 2019-10-06 NOTE — Progress Notes (Signed)
Report called to [place], RN on 4E. This RN also called and spoke with patient's daughter Lawernce Pitts and let her know that patient was moving to a new room on [place] room 24.

## 2019-10-07 DIAGNOSIS — I209 Angina pectoris, unspecified: Secondary | ICD-10-CM

## 2019-10-07 LAB — CBC
HCT: 35.5 % — ABNORMAL LOW (ref 36.0–46.0)
Hemoglobin: 12 g/dL (ref 12.0–15.0)
MCH: 30.1 pg (ref 26.0–34.0)
MCHC: 33.8 g/dL (ref 30.0–36.0)
MCV: 89 fL (ref 80.0–100.0)
Platelets: 216 10*3/uL (ref 150–400)
RBC: 3.99 MIL/uL (ref 3.87–5.11)
RDW: 13.2 % (ref 11.5–15.5)
WBC: 10.6 10*3/uL — ABNORMAL HIGH (ref 4.0–10.5)
nRBC: 0 % (ref 0.0–0.2)

## 2019-10-07 LAB — GLUCOSE, CAPILLARY
Glucose-Capillary: 111 mg/dL — ABNORMAL HIGH (ref 70–99)
Glucose-Capillary: 120 mg/dL — ABNORMAL HIGH (ref 70–99)
Glucose-Capillary: 188 mg/dL — ABNORMAL HIGH (ref 70–99)
Glucose-Capillary: 130 mg/dL — ABNORMAL HIGH (ref 70–99)

## 2019-10-07 LAB — BASIC METABOLIC PANEL
Anion gap: 12 (ref 5–15)
BUN: 9 mg/dL (ref 8–23)
CO2: 27 mmol/L (ref 22–32)
Calcium: 8 mg/dL — ABNORMAL LOW (ref 8.9–10.3)
Chloride: 99 mmol/L (ref 98–111)
Creatinine, Ser: 1.06 mg/dL — ABNORMAL HIGH (ref 0.44–1.00)
GFR calc Af Amer: >60 mL/min (ref >60)
GFR calc non Af Amer: 54 mL/min — ABNORMAL LOW (ref >60)
Glucose, Bld: 104 mg/dL — ABNORMAL HIGH (ref 70–99)
Potassium: 3 mmol/L — ABNORMAL LOW (ref 3.5–5.1)
Sodium: 138 mmol/L (ref 135–145)

## 2019-10-07 MED ORDER — ACETAMINOPHEN 500 MG PO TABS: 1000.0000 mg | ORAL_TABLET | Freq: Four times a day (QID) | ORAL | 0 refills | Status: DC | PRN

## 2019-10-07 MED ORDER — POTASSIUM CHLORIDE CRYS ER 20 MEQ PO TBCR: 40.0000 meq | EXTENDED_RELEASE_TABLET | Freq: Two times a day (BID) | ORAL | Status: DC

## 2019-10-07 MED ORDER — METOPROLOL TARTRATE 25 MG PO TABS: 25.0000 mg | ORAL_TABLET | Freq: Two times a day (BID) | ORAL | Status: DC

## 2019-10-07 MED ORDER — POTASSIUM CHLORIDE CRYS ER 20 MEQ PO TBCR
40.0000 meq | EXTENDED_RELEASE_TABLET | Freq: Two times a day (BID) | ORAL | Status: DC
  Administered 2019-10-07 – 2019-10-08 (×3): 40 meq via ORAL
  Filled 2019-10-07 (×3): qty 2

## 2019-10-07 MED ORDER — METOPROLOL TARTRATE 25 MG PO TABS
37.5000 mg | ORAL_TABLET | Freq: Two times a day (BID) | ORAL | Status: DC
  Administered 2019-10-07 (×2): 37.5 mg via ORAL
  Filled 2019-10-07 (×2): qty 1

## 2019-10-07 NOTE — Discharge Instructions (Signed)

## 2019-10-07 NOTE — Progress Notes (Addendum)
      [address]       [place] [address]             [phone]       4 Days Post-Op Procedure(s) (LRB): MEDIASTINAL WASHOUT (N/A)   Subjective:  Patient states doing okay.  Has no specific complaints.  She is ambulating and feels like she has been getting stronger.  + BM  Objective: Vital signs in last 24 hours: Temp:  [97.7 F (36.5 C)-98.6 F (37 C)] 98.3 F (36.8 C) (06/08 0402) Pulse Rate:  [56-92] 56 (06/08 0402) Cardiac Rhythm: Normal sinus rhythm (06/08 0519) Resp:  [16-23] 18 (06/08 0402) BP: (140-182)/(65-101) 140/65 (06/08 0402) SpO2:  [93 %-96 %] 96 % (06/08 0402) Weight:  [69.7 kg-71.9 kg] 69.7 kg (06/08 0626)  Intake/Output from previous day: 06/07 0701 - 06/08 0700 In: 150 [P.O.:150] Out: -   General appearance: alert, cooperative and no distress Heart: regular rate and rhythm Lungs: clear to auscultation bilaterally Abdomen: soft, non-tender; bowel sounds normal; no masses,  no organomegaly Extremities: edema none present Wound: clean and dry  Lab Results: Recent Labs    10/06/19 0456 10/07/19 0343  WBC 11.6* 10.6*  HGB 11.5* 12.0  HCT 35.2* 35.5*  PLT 169 216   BMET:  Recent Labs    10/06/19 0456 10/07/19 0343  NA 142 138  K 3.8 3.0*  CL 105 99  CO2 24 27  GLUCOSE 115* 104*  BUN 13 9  CREATININE 1.04* 1.06*  CALCIUM 8.1* 8.0*    PT/INR: No results for input(s): LABPROT, INR in the last 72 hours. ABG    Component Value Date/Time   PHART 7.360 10/03/2019 1604   HCO3 22.4 10/03/2019 1604   TCO2 24 10/03/2019 1604   ACIDBASEDEF 3.0 (H) 10/03/2019 1604   O2SAT 95.0 10/03/2019 1604   CBG (last 3)  Recent Labs    10/06/19 1531 10/06/19 2139 10/07/19 0643  GLUCAP 124* 118* 111*    Assessment/Plan: S/P Procedure(s) (LRB): MEDIASTINAL WASHOUT (N/A)  1. CV- PAF, in NSR.. however going Bradycardic at times- will decrease Lopressor, continue Amiodarone, Lopressor 2. Pulm- no acute issues, off oxygen  continue IS 3. Renal- creatinine WNL continue Lasix 4. Hypokalemic- will give 80 meq of potassium today 5. DM-sugars well controlled 6. Dispo- patient stable, decrease BB due to bradycardia, supplement K, DME orders placed... if remains clinically stable possibly ready for d/c in next 24-48 hours    LOS: 7 days   Ellwood Handler, PA-C 10/07/2019  Doing well Ambulating ok Potassium low, will replete dispo planning  Annalissa Murphey [place]

## 2019-10-08 DIAGNOSIS — I10 Essential (primary) hypertension: Secondary | ICD-10-CM

## 2019-10-08 LAB — GLUCOSE, CAPILLARY
Glucose-Capillary: 129 mg/dL — ABNORMAL HIGH (ref 70–99)
Glucose-Capillary: 202 mg/dL — ABNORMAL HIGH (ref 70–99)

## 2019-10-08 LAB — BASIC METABOLIC PANEL
Anion gap: 11 (ref 5–15)
BUN: 13 mg/dL (ref 8–23)
CO2: 28 mmol/L (ref 22–32)
Calcium: 8.3 mg/dL — ABNORMAL LOW (ref 8.9–10.3)
Chloride: 102 mmol/L (ref 98–111)
Creatinine, Ser: 1.21 mg/dL — ABNORMAL HIGH (ref 0.44–1.00)
GFR calc Af Amer: 53 mL/min — ABNORMAL LOW (ref >60)
GFR calc non Af Amer: 46 mL/min — ABNORMAL LOW (ref >60)
Glucose, Bld: 120 mg/dL — ABNORMAL HIGH (ref 70–99)
Potassium: 3.5 mmol/L (ref 3.5–5.1)
Sodium: 141 mmol/L (ref 135–145)

## 2019-10-08 LAB — CBC
HCT: 37.5 % (ref 36.0–46.0)
Hemoglobin: 12.7 g/dL (ref 12.0–15.0)
MCH: 30 pg (ref 26.0–34.0)
MCHC: 33.9 g/dL (ref 30.0–36.0)
MCV: 88.7 fL (ref 80.0–100.0)
Platelets: 246 10*3/uL (ref 150–400)
RBC: 4.23 MIL/uL (ref 3.87–5.11)
RDW: 13.1 % (ref 11.5–15.5)
WBC: 12 10*3/uL — ABNORMAL HIGH (ref 4.0–10.5)
nRBC: 0 % (ref 0.0–0.2)

## 2019-10-08 MED ORDER — AMLODIPINE BESYLATE 10 MG PO TABS
10.0000 mg | ORAL_TABLET | Freq: Every day | ORAL | Status: DC
  Administered 2019-10-08: 10 mg via ORAL
  Filled 2019-10-08: qty 1

## 2019-10-08 MED ORDER — TRAMADOL HCL 50 MG PO TABS: 50.0000 mg | ORAL_TABLET | Freq: Four times a day (QID) | ORAL | 0 refills | Status: DC | PRN

## 2019-10-08 MED ORDER — AMIODARONE HCL 200 MG PO TABS: 200.0000 mg | ORAL_TABLET | Freq: Every day | ORAL | 1 refills | Status: DC

## 2019-10-08 MED ORDER — POTASSIUM CHLORIDE CRYS ER 20 MEQ PO TBCR: 20.0000 meq | EXTENDED_RELEASE_TABLET | Freq: Every day | ORAL | 0 refills | Status: DC

## 2019-10-08 MED ORDER — AMLODIPINE BESYLATE 5 MG PO TABS: 5.0000 mg | ORAL_TABLET | Freq: Every day | ORAL | 1 refills | Status: DC

## 2019-10-08 MED ORDER — FUROSEMIDE 40 MG PO TABS: 40.0000 mg | ORAL_TABLET | Freq: Every day | ORAL | 0 refills | Status: DC

## 2019-10-08 MED ORDER — METOPROLOL TARTRATE 25 MG PO TABS: 37.5000 mg | ORAL_TABLET | Freq: Two times a day (BID) | ORAL | 1 refills | Status: DC

## 2019-10-08 MED ORDER — AMLODIPINE BESYLATE 5 MG PO TABS: 5.0000 mg | ORAL_TABLET | Freq: Every day | ORAL | Status: DC

## 2019-10-08 MED ORDER — METOPROLOL TARTRATE 50 MG PO TABS: 50.0000 mg | ORAL_TABLET | Freq: Two times a day (BID) | ORAL | 1 refills | Status: DC

## 2019-10-08 MED ORDER — METOPROLOL TARTRATE 50 MG PO TABS
50.0000 mg | ORAL_TABLET | Freq: Two times a day (BID) | ORAL | Status: DC
  Administered 2019-10-08: 50 mg via ORAL
  Filled 2019-10-08: qty 1

## 2019-10-08 MED ORDER — AMLODIPINE BESYLATE 10 MG PO TABS: 10.0000 mg | ORAL_TABLET | Freq: Every day | ORAL | 1 refills | Status: DC

## 2019-10-08 NOTE — Progress Notes (Signed)
Progress Note  Patient Name: Courtney Gardner Date of Encounter: 10/08/2019  [facility] Cardiologist: Jenean Lindau, MD   Subjective   Feeling well.  Anxious to go home.  She reports having hypertension all her adult life.  It hasn't ever been consistently controlled.  She is very active and has several horses, chickens, dogs and cats.  She mostly cooks at home and doesn't use salt.  Inpatient Medications    Scheduled Meds: . amiodarone  200 mg Oral Daily  . amLODipine  10 mg Oral Daily  . aspirin EC  325 mg Oral Daily   Or  . aspirin  324 mg Per Tube Daily  . atorvastatin  80 mg Oral Daily  . bisacodyl  10 mg Oral Daily   Or  . bisacodyl  10 mg Rectal Daily  . Chlorhexidine Gluconate Cloth  6 each Topical Daily  . docusate sodium  200 mg Oral Daily  . insulin aspart  0-9 Units Subcutaneous TID WC  . levothyroxine  50 mcg Oral QAC breakfast  . lisinopril  20 mg Oral Daily  . mouth rinse  15 mL Mouth Rinse BID  . metoCLOPramide (REGLAN) injection  10 mg Intravenous Q6H  . metoprolol tartrate  50 mg Oral BID  . pantoprazole  40 mg Oral Daily  . PARoxetine  10 mg Oral Daily  . potassium chloride  40 mEq Oral BID  . sodium chloride flush  3 mL Intravenous Q12H  . sodium chloride flush  3 mL Intravenous Q12H   Continuous Infusions: . sodium chloride    . sodium chloride     PRN Meds: sodium chloride, alum & mag hydroxide-simeth, cyclobenzaprine, ondansetron (ZOFRAN) IV, oxyCODONE, promethazine, sodium chloride flush, sodium chloride flush, traMADol   Vital Signs    Vitals:   10/07/19 2300 10/08/19 0352 10/08/19 0526 10/08/19 0810  BP: (!) 181/84 (!) 165/86  (!) 159/72  Pulse: 89 83  94  Resp: 18 20  18   Temp: 98.4 F (36.9 C) 98.3 F (36.8 C)  98.3 F (36.8 C)  TempSrc: Oral Oral  Oral  SpO2: 99% 98%  95%  Weight:   68.3 kg   Height:       No intake or output data in the 24 hours ending 10/08/19 0909 Last 3 Weights 10/08/2019 10/07/2019 10/06/2019  Weight  (lbs) 150 lb 8 oz 153 lb 11.2 oz 158 lb 8.2 oz  Weight (kg) 68.266 kg 69.718 kg 71.9 kg      Telemetry    Mostly sinus rhythm/bradycardia with paroxysms of afib/flutter to the 120s - Personally Reviewed  ECG    n/a - Personally Reviewed  Physical Exam   GEN: No acute distress.   Neck: No JVD Cardiac: RRR, no murmurs, rubs, or gallops.  Respiratory: Clear to auscultation bilaterally. CHEST: Healing midline sternal incision GI: Soft, nontender, non-distended  MS: No edema; No deformity. Neuro:  Nonfocal  Psych: Normal affect   Labs    High Sensitivity Troponin:  No results for input(s): TROPONINIHS in the last 720 hours.    Chemistry Recent Labs  Lab 10/02/19 0415 10/02/19 0828 10/06/19 0456 10/07/19 0343 10/08/19 0250  NA 139   < > 142 138 141  K 4.3   < > 3.8 3.0* 3.5  CL 107   < > 105 99 102  CO2 22   < > 24 27 28   GLUCOSE 120*   < > 115* 104* 120*  BUN 16   < >  13 9 13   CREATININE 1.12*   < > 1.04* 1.06* 1.21*  CALCIUM 8.8*   < > 8.1* 8.0* 8.3*  PROT 5.9*  --   --   --   --   ALBUMIN 3.4*  --   --   --   --   AST 30  --   --   --   --   ALT 15  --   --   --   --   ALKPHOS 70  --   --   --   --   BILITOT 1.7*  --   --   --   --   GFRNONAA 50*   < > 55* 54* 46*  GFRAA 58*   < > >60 >60 53*  ANIONGAP 10   < > 13 12 11    < > = values in this interval not displayed.     Hematology Recent Labs  Lab 10/06/19 0456 10/07/19 0343 10/08/19 0250  WBC 11.6* 10.6* 12.0*  RBC 3.87 3.99 4.23  HGB 11.5* 12.0 12.7  HCT 35.2* 35.5* 37.5  MCV 91.0 89.0 88.7  MCH 29.7 30.1 30.0  MCHC 32.7 33.8 33.9  RDW 13.9 13.2 13.1  PLT 169 216 246    BNPNo results for input(s): BNP, PROBNP in the last 168 hours.   DDimer No results for input(s): DDIMER in the last 168 hours.   Radiology    No results found.  Cardiac Studies   LHC 09/30/19:  Prox RCA lesion is 30% stenosed.  Ost LM to Dist LM lesion is 75% stenosed.  Mid Cx to Dist Cx lesion is 40%  stenosed.  1st Mrg lesion is 65% stenosed.  Prox LAD lesion is 70% stenosed.  The left ventricular systolic function is normal.  LV end diastolic pressure is normal.  The left ventricular ejection fraction is 55-65% by visual estimate.   Significant coronary calcification involving the left main with catheter dampening with each engagement and stenosis of 75%, calcification of the proximal LAD with narrowing up to 70%; 70% stenoses in the circumflex marginal vessel with mild 40% mid stenosis and a dominant left circumflex vessel and nondominant RCA with 30% proximal stenosis.  Normal LV function with EF estimated at 60 to 65%.  LVEDP 14 mm.  Definitely hypertensive throughout the catheterization; IV nitroglycerin was started at the end of the procedure.  Echo 09/11/19: 1. Left ventricular ejection fraction, by estimation, is 50 to 55%. The  left ventricle has low normal function. The left ventricle has no regional  wall motion abnormalities. Left ventricular diastolic parameters are  indeterminate.  2. Right ventricular systolic function is normal. The right ventricular  size is normal.  3. The mitral valve is normal in structure. Trivial mitral valve  regurgitation. No evidence of mitral stenosis.  4. The aortic valve is normal in structure. Aortic valve regurgitation is  not visualized. No aortic stenosis is present.  5. The inferior vena cava is normal in size with greater than 50%  respiratory variability, suggesting right atrial pressure of 3 mmHg.   Renal Dopplers 12/24/17:  Normal R renal artery.  L kidney normal size.  Limited visualization of L renal artery.  Patient Profile     69 y.o. female with hypertension, hyperlipidemia, prior tobacco abuse admitted with unstable angina.  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  Assessment & Plan    # Unstable angina: # Hyperlipidemia:  Now s/p successful  CABG.  Continue aspirin,  atorvastatin.  Atorvastatin was started this admission.  She needs lipids/CMP checked in 6-8 weeks.   # Hypertension:  At home Ms. Clonch was taking lisinopril, metoprolol and Imdur at home.  Amlodipine was added today.  This is a very reasonable addition.  It may take a couple days to see the full effect.  Long term, would consider switching metoprolol to carvedilol for improved BP control and less bradycardia.  However, given her PAF would continue metoprolol for now.  No renal artery stenosis on CT-A 05/2019.  The appropriate secondary causes of hypertension have been assessed as below.  Could consider checking AM renin/aldo as an outpatient, but this would require holding lisinopril.  I have asked her to check her BP twice daily and bring to follow up in the [facility] in a week.  Secondary Causes of Hypertension Assessment:  Medications/Herbal:  Denies the use of: OCP, steroids, stimulants, antidepressants, weight loss medication, immune suppressants, NSAIDs, sympathomimetics, alcohol (rare), caffeine (minimal), licorice, ginseng, St. John's wort, chemo  Sleep Apnea- denies snoring or apnea Renal artery stenosis- had Dopplers in 2019.  R normal.  L indeterminate.  Normal CT-A of the abdomen 05/2019. Hyperaldosteronism -not assessed by labs.  No adenoma on CT 05/2019. Hyper/hypothyroidism- TSH normal 08/2019 Pheochromocytoma: no symptoms Cushing's syndrome: no symptoms Coarctation of the aorta  # Paroxysmal atrial fibrillation: She had PAF and was started on amiodarone.  Metoprolol was reduced due to bradycardia.  Will switch to carvedilol for improved BP control.    # Mild AKI:  Agree with holding lasix and continuing lisinopril.  She appears to be euvolemic.  We will check a BMP in one week when she is seen in clinic.   [facility] will sign off.   Medication Recommendations:  Continue current meds Other recommendations (labs, testing, etc):  Check BMP in 1 week at  follow up Follow up as an outpatient:  We will arrange ADV HTN f/u in 1 week.    For questions or updates, please contact [place] Please consult [email or web address] for contact info under        Signed, Skeet Latch, MD  10/08/2019, 9:09 AM

## 2019-10-08 NOTE — Progress Notes (Addendum)
      [address]       [place] [address]             [phone]        5 Days Post-Op Procedure(s) (LRB): MEDIASTINAL WASHOUT (N/A)  Subjective: Patient without complaints this am. She is ordering breakfast. She hopes to go home.  Objective: Vital signs in last 24 hours: Temp:  [98.2 F (36.8 C)-98.6 F (37 C)] 98.3 F (36.8 C) (06/09 0352) Pulse Rate:  [79-89] 83 (06/09 0352) Cardiac Rhythm: Supraventricular tachycardia (06/09 0202) Resp:  [18-22] 20 (06/09 0352) BP: (135-186)/(65-94) 165/86 (06/09 0352) SpO2:  [98 %-100 %] 98 % (06/09 0352) Weight:  [68.3 kg] 68.3 kg (06/09 0526)  Pre op weight 68 kg Current Weight  10/08/19 68.3 kg      Intake/Output from previous day: No intake/output data recorded.   Physical Exam:  Cardiovascular: RRR Pulmonary: Clear to auscultation bilaterally Abdomen: Soft, non tender, bowel sounds present. Extremities: Trace bilateral lower extremity edema. Wounds: Clean and dry.  No erythema or signs of infection.  Lab Results: CBC: Recent Labs    10/07/19 0343 10/08/19 0250  WBC 10.6* 12.0*  HGB 12.0 12.7  HCT 35.5* 37.5  PLT 216 246   BMET:  Recent Labs    10/07/19 0343 10/08/19 0250  NA 138 141  K 3.0* 3.5  CL 99 102  CO2 27 28  GLUCOSE 104* 120*  BUN 9 13  CREATININE 1.06* 1.21*  CALCIUM 8.0* 8.3*    PT/INR:  Lab Results  Component Value Date   INR 1.1 10/03/2019   INR 1.3 (H) 10/02/2019   INR 1.4 (H) 10/02/2019   ABG:  INR: Will add last result for INR, ABG once components are confirmed Will add last 4 CBG results once components are confirmed  Assessment/Plan:  1. CV - Previous a fib. Intermittent episodes of bradycardia so BB decreased yesterday. Run of SVT 06/08;otherwise SR and hypertensive this am. On Lopressor 37.5 mg bid and Lisinopril 20 mg daily. Will add Amlodipine to help with BP as creatinine slightly increase and is on Lisinopril 20 mg daily. Also, Dr. Kipp Brood  increased Lopressor back to 50 mg bid. 2.  Pulmonary - On room air. Encourage incentive spirometer 3. Volume Overload - On Lasix 40 mg daily 4.  Expected post op acute blood loss anemia - H and H this am 12.7 and 37.5 5. DM-CBGs 188/130/129. On Metformin 500 mg at hs. Pre op HGA1C 7.7 6. Creatinine this am 1.21 7. Hypothyroidism-continue Levothyroxine 50 mcg daily 8. Supplement potassium 9. As discussed with Dr. Kipp Brood, discharge home after cardiology evaluates and medications finalized  Jerine Surles M ZimmermanPA-C 10/08/2019,6:55 AM

## 2019-10-08 NOTE — Progress Notes (Signed)
Order received to discharge patient.  Telemetry monitor removed and CCMD notified.  PIV access removed.  Discharge instructions, follow up, medications and instructions for their use discussed with patient. 

## 2019-10-08 NOTE — Care Management Important Message (Signed)
Important Message  Patient Details  Name: Courtney Gardner MRN: [id number] Date of Birth: 1950-09-18   Medicare Important Message Given:  Yes     Shelda Altes 10/08/2019, 11:33 AM

## 2019-10-08 NOTE — Progress Notes (Signed)
CARDIAC REHAB PHASE I   PRE:  Rate/Rhythm: 79 SR  BP:  Supine:   Sitting: 206/84,  208/82 left arm    199/78 right arm  Standing:    SaO2: 97%RA  MODE:  Ambulation: 200 ft   POST:  Rate/Rhythm: 57 SR  freq PACS  ? Short runs a fib  BP:  Supine:   Sitting: 177/88 right arm  Standing:    SaO2: 98%RA 0957-1100 Pt went to bathroom twice while I worked with her for BMs. When in bathroom, rhythm looked irregular as if pt trying to go into afib. BP elevated prior to walk but did not increase with walk. BP meds have been adjusted. Pt has automatic BP cuff at home. Encouraged her to take BP at home to see if it comes down with med adjustment. Pt walked 200 ft on RA with her walker, stopping a couple of times due to legs feeling a little weak. To bed after walk and bathroom trips. Discussed sternal precautions, IS, walking for exercise (but watching BP and taking her time), gave heart healthy and diabetic diets. Encouraged pt to watch sodium with high BP. Discussed CRP 2 and referred to [facility] program. Pt voiced understanding of ed . Has roommate who will be available to assist with care after discharge. Notified pt's RN of high BP, frequent BMs and irregular heart rhythm.   Graylon Good, RN BSN  10/08/2019 10:50 AM

## 2019-10-08 NOTE — TOC Transition Note (Signed)
Transition of Care [facility]) - CM/SW Discharge Note   Patient Details  Name: Courtney Gardner MRN: [id number] Date of Birth: 06-Mar-1951  [facility] CM/SW Contact:  Zenon Mayo, RN Phone Number: 10/08/2019, 10:29 AM   Clinical Narrative:    Patient for dc today, NCM spoke with patient, she states she has the rolling walker and shower stool in the room.  She has no other needs.   Final next level of care: Home/Self Care Barriers to Discharge: No Barriers Identified   Patient Goals and CMS Choice        Discharge Placement                       Discharge Plan and Services     Post Acute Care Choice: Durable Medical Equipment          DME Arranged: Walker rolling, Shower stool DME Agency: [employer] Date DME Agency Contacted: 10/07/19 Time DME Agency Contacted: 1000 Representative spoke with at DME Agency: [place] Determinants of Health ([place]) Interventions     Readmission Risk Interventions No flowsheet data found.

## 2019-10-09 ENCOUNTER — Telehealth (HOSPITAL_COMMUNITY): Payer: Self-pay

## 2019-10-09 NOTE — Telephone Encounter (Signed)
Faxed referral to [facility] for Cardiac Rehab. 

## 2019-10-10 ENCOUNTER — Ambulatory Visit: Payer: Medicare Other | Admitting: Thoracic Surgery (Cardiothoracic Vascular Surgery)

## 2019-10-13 ENCOUNTER — Telehealth: Payer: Self-pay | Admitting: *Deleted

## 2019-10-13 ENCOUNTER — Other Ambulatory Visit: Payer: Self-pay | Admitting: *Deleted

## 2019-10-13 ENCOUNTER — Other Ambulatory Visit: Payer: Self-pay

## 2019-10-13 DIAGNOSIS — R197 Diarrhea, unspecified: Secondary | ICD-10-CM

## 2019-10-13 DIAGNOSIS — R112 Nausea with vomiting, unspecified: Secondary | ICD-10-CM

## 2019-10-13 MED ORDER — ONDANSETRON HCL 4 MG PO TABS: 4.0000 mg | ORAL_TABLET | Freq: Three times a day (TID) | ORAL | 0 refills | Status: DC | PRN

## 2019-10-13 MED ORDER — LOPERAMIDE HCL 2 MG PO TABS: 2.0000 mg | ORAL_TABLET | Freq: Four times a day (QID) | ORAL | 0 refills | Status: DC | PRN

## 2019-10-13 NOTE — Telephone Encounter (Signed)
Ms. Courtney Gardner, a good friend of [name], is calling to relate that she is having diarrhea around once or twice a day and is nauseated.  When questioned, she was having diarrhea in the hospital also and it has continues since discharge.  She had been given an approximate 5 day course of Colace.  She is nauseated, but has not vomited.  She is drinking Pedialyte.  She is not having any abdominal pain, just a little cramping right before the diarrhea.  I discussed this with Nicholes Rough, PA.  It was decided to advise her to get Imodium AD OTC to control the diarrhea and prescribe Zofran for her nausea.  I called her friend and left a message with these instructions.  I instructed her to let us know if there is no improvement.

## 2019-10-16 ENCOUNTER — Ambulatory Visit (INDEPENDENT_AMBULATORY_CARE_PROVIDER_SITE_OTHER): Payer: Medicare Other | Admitting: Cardiology

## 2019-10-16 ENCOUNTER — Encounter: Payer: Self-pay | Admitting: Cardiology

## 2019-10-16 ENCOUNTER — Other Ambulatory Visit: Payer: Self-pay

## 2019-10-16 VITALS — BP 108/44 | HR 70 | Ht 66.0 in | Wt 146.0 lb

## 2019-10-16 DIAGNOSIS — Z87891 Personal history of nicotine dependence: Secondary | ICD-10-CM

## 2019-10-16 DIAGNOSIS — E782 Mixed hyperlipidemia: Secondary | ICD-10-CM

## 2019-10-16 DIAGNOSIS — E088 Diabetes mellitus due to underlying condition with unspecified complications: Secondary | ICD-10-CM

## 2019-10-16 DIAGNOSIS — I2510 Atherosclerotic heart disease of native coronary artery without angina pectoris: Secondary | ICD-10-CM

## 2019-10-16 HISTORY — DX: Diabetes mellitus due to underlying condition with unspecified complications: E08.8

## 2019-10-16 NOTE — Patient Instructions (Signed)
Medication Instructions:  No medication changes. *If you need a refill on your cardiac medications before your next appointment, please call your pharmacy*   Lab Work: Your physician recommends that you have labs done in the office today. Your test included  basic metabolic panel and a complete blood count.  If you have labs (blood work) drawn today and your tests are completely normal, you will receive your results only by: Marland Kitchen MyChart Message (if you have MyChart) OR . A paper copy in the mail If you have any lab test that is abnormal or we need to change your treatment, we will call you to review the results.   Testing/Procedures: None ordered   Follow-Up: At [facility], you and your health needs are our priority.  As part of our continuing mission to provide you with exceptional heart care, we have created designated Provider Care Teams.  These Care Teams include your primary Cardiologist (physician) and Advanced Practice Providers (APPs -  Physician Assistants and Nurse Practitioners) who all work together to provide you with the care you need, when you need it.  We recommend signing up for the patient portal called "MyChart".  Sign up information is provided on this After Visit Summary.  MyChart is used to connect with patients for Virtual Visits (Telemedicine).  Patients are able to view lab/test results, encounter notes, upcoming appointments, etc.  Non-urgent messages can be sent to your provider as well.   To learn more about what you can do with MyChart, go to [email or web address].    Your next appointment:   1 month(s)  The format for your next appointment:   In Person  Provider:   Jyl Heinz, MD   Other Instructions NA

## 2019-10-16 NOTE — Progress Notes (Signed)
Cardiology Office Note:    Date:  10/16/2019   ID:  Courtney Gardner, DOB 1951-03-29, MRN [id number]  PCP:  Marguerita Merles, MD  Cardiologist:  Jenean Lindau, MD   Referring MD: Marguerita Merles, MD    ASSESSMENT:    1. Coronary artery disease involving native coronary artery of native heart without angina pectoris   2. Ex-smoker    PLAN:    In order of problems listed above:  1. Coronary artery disease post CABG surgery: Secondary prevention stressed with the patient.  Importance of compliance with diet medication stressed and she vocalized understanding.  She is doing better and recovering from her surgery.  I will do a Chem-7 and a CBC today. 2. Mixed dyslipidemia: Patient on statin therapy.  Diet emphasized. 3. Diabetes mellitus: Managed by primary care provider. 4. Diarrhea: She has let her primary care provider and cardiothoracic team know about this.  Told her to not take her lisinopril tomorrow morning.  Her blood pressure is borderline.  I told her to replenish herself with ample Gatorade and she promises to do so.  She will be seen follow-up appointment in a month or earlier if she has any concerns.  She knows to go to nearest emergency room or call her primary care physician if the diarrhea does not subside.   Medication Adjustments/Labs and Tests Ordered: Current medicines are reviewed at length with the patient today.  Concerns regarding medicines are outlined above.  No orders of the defined types were placed in this encounter.  No orders of the defined types were placed in this encounter.    No chief complaint on file.    History of Present Illness:    Courtney Gardner is a 68 y.o. female.  Patient was evaluated by me for angina.  Coronary angiography revealed triple-vessel coronary artery disease and patient has undergone CABG surgery.  Subsequently she is done fine.  No chest pain orthopnea or PND.  At the time of my evaluation, the patient is alert awake oriented  and in no distress.  Patient mentions to me that she has had some diarrhea issues and has felt somewhat weak.  At the time of my evaluation, the patient is alert awake oriented and in no distress.  Past Medical History:  Diagnosis Date  . Angina pectoris ([place]) 11/20/2017  . Coronary artery disease involving native coronary artery of native heart 12/26/2017  . Diabetes mellitus without complication ([place])   . Essential hypertension 11/20/2017  . Ex-smoker 11/20/2017  . Hyperlipidemia 11/20/2017  . Iliotibial band syndrome of left side 09/09/2015    Past Surgical History:  Procedure Laterality Date  . CHOLECYSTECTOMY    . CORONARY ARTERY BYPASS GRAFT N/A 10/02/2019   Procedure: CORONARY ARTERY BYPASS GRAFTING (CABG) TIMES TWO, ON PUMP, USING LEFT INTERNAL MAMMARY ARTERY AND RIGHT GREATER SAPHENOUS VEIN HARVESTED ENDOSCOPICALLY;  Surgeon: [name], MD;  Location: [facility];  Service: Open Heart Surgery;  Laterality: N/A;  LIMA to LAD, SVG to OM 1  . INTRAVASCULAR PRESSURE WIRE/FFR STUDY N/A 11/23/2017   Procedure: INTRAVASCULAR PRESSURE WIRE/FFR STUDY;  Surgeon: Jettie Booze, MD;  Location: [facility];  Service: Cardiovascular;  Laterality: N/A;  . LEFT HEART CATH AND CORONARY ANGIOGRAPHY N/A 11/23/2017   Procedure: LEFT HEART CATH AND CORONARY ANGIOGRAPHY;  Surgeon: Jettie Booze, MD;  Location: [facility];  Service: Cardiovascular;  Laterality: N/A;  . LEFT HEART CATH AND CORONARY ANGIOGRAPHY N/A 09/30/2019  Procedure: LEFT HEART CATH AND CORONARY ANGIOGRAPHY;  Surgeon: Troy Sine, MD;  Location: [facility];  Service: Cardiovascular;  Laterality: N/A;  . MEDIASTINAL EXPLORATION N/A 10/03/2019   Procedure: MEDIASTINAL WASHOUT;  Surgeon: [name], MD;  Location: [place];  Service: Thoracic;  Laterality: N/A;  . TEE WITHOUT CARDIOVERSION N/A 10/02/2019   Procedure: TRANSESOPHAGEAL ECHOCARDIOGRAM (TEE);  Surgeon: [name], MD;  Location:  [facility];  Service: Open Heart Surgery;  Laterality: N/A;  . TUBAL LIGATION      Current Medications: Current Meds  Medication Sig  . acetaminophen (TYLENOL) 500 MG tablet Take 2 tablets (1,000 mg total) by mouth every 6 (six) hours as needed.  . ALPRAZolam (XANAX) 0.25 MG tablet Take 1 tablet (0.25 mg total) by mouth at bedtime as needed for anxiety.  Marland Kitchen amiodarone (PACERONE) 200 MG tablet Take 1 tablet (200 mg total) by mouth daily.  Marland Kitchen amLODipine (NORVASC) 10 MG tablet Take 1 tablet (10 mg total) by mouth daily.  Marland Kitchen aspirin EC 81 MG tablet Take 1 tablet (81 mg total) by mouth daily.  Marland Kitchen atorvastatin (LIPITOR) 40 MG tablet Take 40 mg by mouth at bedtime.   Marland Kitchen levothyroxine (SYNTHROID, LEVOTHROID) 50 MCG tablet Take 50 mcg by mouth daily before breakfast.   . lisinopril (ZESTRIL) 20 MG tablet Take 20 mg by mouth daily.  Marland Kitchen loperamide (IMODIUM A-D) 2 MG tablet Take 1 tablet (2 mg total) by mouth 4 (four) times daily as needed for diarrhea or loose stools.  . metFORMIN (GLUCOPHAGE) 500 MG tablet Take 500 mg by mouth at bedtime.   . metoprolol tartrate (LOPRESSOR) 50 MG tablet Take 1 tablet (50 mg total) by mouth 2 (two) times daily.  . nitroGLYCERIN (NITROSTAT) 0.4 MG SL tablet Place 1 tablet (0.4 mg total) under the tongue every 5 (five) minutes as needed for chest pain.  Marland Kitchen omeprazole (PRILOSEC) 40 MG capsule Take 40 mg by mouth daily.  . ondansetron (ZOFRAN) 4 MG tablet Take 1 tablet (4 mg total) by mouth every 8 (eight) hours as needed for nausea or vomiting.  Marland Kitchen PARoxetine (PAXIL) 10 MG tablet Take 10 mg by mouth daily.     Allergies:   Patient has no known allergies.   Social History   Socioeconomic History  . Marital status: Unknown    Spouse name: Not on file  . Number of children: Not on file  . Years of education: Not on file  . Highest education level: Not on file  Occupational History  . Not on file  Tobacco Use  . Smoking status: Former Research scientist (life sciences)  . Smokeless tobacco: Never Used    Vaping Use  . Vaping Use: Never used  Substance and Sexual Activity  . Alcohol use: Not Currently  . Drug use: Never  . Sexual activity: Not on file  Other Topics Concern  . Not on file  Social History Narrative  . Not on file   Social Determinants of Health   Financial Resource Strain:   . Difficulty of Paying Living Expenses:   Food Insecurity:   . Worried About Charity fundraiser in the Last Year:   . Arboriculturist in the Last Year:   Transportation Needs:   . Film/video editor (Medical):   Marland Kitchen Lack of Transportation (Non-Medical):   Physical Activity:   . Days of Exercise per Week:   . Minutes of Exercise per Session:   Stress:   . Feeling of Stress :  Social Connections:   . Frequency of Communication with Friends and Family:   . Frequency of Social Gatherings with Friends and Family:   . Attends Religious Services:   . Active Member of Clubs or Organizations:   . Attends Archivist Meetings:   Marland Kitchen Marital Status:      Family History: The patient's family history includes Heart attack in her maternal grandfather, maternal grandmother, and maternal uncle; Hypertension in her sister; Leukemia in her mother; Lung cancer in her father.  ROS:   Please see the history of present illness.    All other systems reviewed and are negative.  EKGs/Labs/Other Studies Reviewed:    The following studies were reviewed today: Troy Sine, MD (Primary)    Procedures  LEFT HEART CATH AND CORONARY ANGIOGRAPHY  Conclusion    Prox RCA lesion is 30% stenosed.  Ost LM to Dist LM lesion is 75% stenosed.  Mid Cx to Dist Cx lesion is 40% stenosed.  1st Mrg lesion is 65% stenosed.  Prox LAD lesion is 70% stenosed.  The left ventricular systolic function is normal.  LV end diastolic pressure is normal.  The left ventricular ejection fraction is 55-65% by visual estimate.   Significant coronary calcification involving the left main with catheter  dampening with each engagement and stenosis of 75%, calcification of the proximal LAD with narrowing up to 70%; 70% stenoses in the circumflex marginal vessel with mild 40% mid stenosis and a dominant left circumflex vessel and nondominant RCA with 30% proximal stenosis.  Normal LV function with EF estimated at 60 to 65%.  LVEDP 14 mm.  Definitely hypertensive throughout the catheterization; IV nitroglycerin was started at the end of the procedure.  RECOMMENDATION: Angiograms were reviewed with colleagues who agreed with the angiographic significance of the left main.  Will admit patient to the hospital.  Titrate IV nitroglycerin. Adjunctive medical therapy for CAD.  Surgical consultation for CABG revascularization.  Aggressive lipid intervention.      Recent Labs: 09/11/2019: TSH 1.700 10/02/2019: ALT 15 10/03/2019: Magnesium 1.8 10/08/2019: BUN 13; Creatinine, Ser 1.21; Hemoglobin 12.7; Platelets 246; Potassium 3.5; Sodium 141  Recent Lipid Panel    Component Value Date/Time   CHOL 138 09/11/2019 1705   TRIG 73 09/11/2019 1705   HDL 68 09/11/2019 1705   CHOLHDL 2.0 09/11/2019 1705   LDLCALC 56 09/11/2019 1705    Physical Exam:    VS:  BP (!) 108/44   Pulse 70   Ht 5\' 6"  (1.676 m)   Wt 146 lb (66.2 kg)   SpO2 98%   BMI 23.57 kg/m     Wt Readings from Last 3 Encounters:  10/16/19 146 lb (66.2 kg)  10/08/19 150 lb 8 oz (68.3 kg)  09/25/19 153 lb (69.4 kg)     GEN: Patient is in no acute distress HEENT: Normal NECK: No JVD; No carotid bruits LYMPHATICS: No lymphadenopathy CARDIAC: Hear sounds regular, 2/6 systolic murmur at the apex. RESPIRATORY:  Clear to auscultation without rales, wheezing or rhonchi  ABDOMEN: Soft, non-tender, non-distended MUSCULOSKELETAL:  No edema; No deformity surgical sites are healing well. SKIN: Warm and dry NEUROLOGIC:  Alert and oriented x 3 PSYCHIATRIC:  Normal affect   Signed, Jenean Lindau, MD  10/16/2019 3:58 PM    [facility]

## 2019-10-17 ENCOUNTER — Emergency Department (HOSPITAL_COMMUNITY): Payer: Medicare Other

## 2019-10-17 ENCOUNTER — Inpatient Hospital Stay (HOSPITAL_COMMUNITY)
Admission: AD | Admit: 2019-10-17 | Discharge: 2019-10-19 | DRG: 641 | Disposition: A | Discharge status: Home / Self Care | Payer: Medicare Other | Attending: Internal Medicine | Admitting: Internal Medicine

## 2019-10-17 ENCOUNTER — Telehealth: Payer: Self-pay

## 2019-10-17 DIAGNOSIS — E039 Hypothyroidism, unspecified: Secondary | ICD-10-CM | POA: Diagnosis present

## 2019-10-17 DIAGNOSIS — E876 Hypokalemia: Secondary | ICD-10-CM | POA: Diagnosis present

## 2019-10-17 DIAGNOSIS — Z79899 Other long term (current) drug therapy: Secondary | ICD-10-CM

## 2019-10-17 DIAGNOSIS — J90 Pleural effusion, not elsewhere classified: Secondary | ICD-10-CM

## 2019-10-17 DIAGNOSIS — I480 Paroxysmal atrial fibrillation: Secondary | ICD-10-CM

## 2019-10-17 DIAGNOSIS — N179 Acute kidney failure, unspecified: Secondary | ICD-10-CM | POA: Diagnosis present

## 2019-10-17 DIAGNOSIS — Z951 Presence of aortocoronary bypass graft: Secondary | ICD-10-CM

## 2019-10-17 DIAGNOSIS — I152 Hypertension secondary to endocrine disorders: Secondary | ICD-10-CM | POA: Diagnosis present

## 2019-10-17 DIAGNOSIS — E871 Hypo-osmolality and hyponatremia: Secondary | ICD-10-CM | POA: Diagnosis present

## 2019-10-17 DIAGNOSIS — E1159 Type 2 diabetes mellitus with other circulatory complications: Secondary | ICD-10-CM

## 2019-10-17 DIAGNOSIS — Z7984 Long term (current) use of oral hypoglycemic drugs: Secondary | ICD-10-CM

## 2019-10-17 DIAGNOSIS — I2510 Atherosclerotic heart disease of native coronary artery without angina pectoris: Secondary | ICD-10-CM | POA: Diagnosis present

## 2019-10-17 DIAGNOSIS — Z8249 Family history of ischemic heart disease and other diseases of the circulatory system: Secondary | ICD-10-CM

## 2019-10-17 DIAGNOSIS — Z79890 Hormone replacement therapy (postmenopausal): Secondary | ICD-10-CM

## 2019-10-17 DIAGNOSIS — Z20822 Contact with and (suspected) exposure to covid-19: Secondary | ICD-10-CM | POA: Diagnosis present

## 2019-10-17 DIAGNOSIS — E1169 Type 2 diabetes mellitus with other specified complication: Secondary | ICD-10-CM

## 2019-10-17 DIAGNOSIS — E785 Hyperlipidemia, unspecified: Secondary | ICD-10-CM | POA: Diagnosis present

## 2019-10-17 DIAGNOSIS — Z7982 Long term (current) use of aspirin: Secondary | ICD-10-CM

## 2019-10-17 DIAGNOSIS — E860 Dehydration: Secondary | ICD-10-CM | POA: Diagnosis not present

## 2019-10-17 DIAGNOSIS — Z87891 Personal history of nicotine dependence: Secondary | ICD-10-CM

## 2019-10-17 DIAGNOSIS — Z9049 Acquired absence of other specified parts of digestive tract: Secondary | ICD-10-CM

## 2019-10-17 DIAGNOSIS — H409 Unspecified glaucoma: Secondary | ICD-10-CM | POA: Diagnosis present

## 2019-10-17 HISTORY — DX: Acute kidney failure, unspecified: N17.9

## 2019-10-17 HISTORY — DX: Pleural effusion, not elsewhere classified: J90

## 2019-10-17 HISTORY — DX: Hypothyroidism, unspecified: E03.9

## 2019-10-17 HISTORY — DX: Type 2 diabetes mellitus with other specified complication: E11.69

## 2019-10-17 HISTORY — DX: Paroxysmal atrial fibrillation: I48.0

## 2019-10-17 LAB — BASIC METABOLIC PANEL
BUN/Creatinine Ratio: 18 (ref 12–28)
BUN: 25 mg/dL (ref 8–27)
CO2: 21 mmol/L (ref 20–29)
Calcium: 8.8 mg/dL (ref 8.7–10.3)
Chloride: 87 mmol/L — ABNORMAL LOW (ref 96–106)
Creatinine, Ser: 1.42 mg/dL — ABNORMAL HIGH (ref 0.57–1.00)
GFR calc Af Amer: 43 mL/min/{1.73_m2} — ABNORMAL LOW (ref >59)
GFR calc non Af Amer: 37 mL/min/{1.73_m2} — ABNORMAL LOW (ref >59)
Glucose: 217 mg/dL — ABNORMAL HIGH (ref 65–99)
Potassium: 4.4 mmol/L (ref 3.5–5.2)
Sodium: 128 mmol/L — ABNORMAL LOW (ref 134–144)

## 2019-10-17 LAB — CBC WITH DIFFERENTIAL/PLATELET
Abs Immature Granulocytes: 0.17 10*3/uL — ABNORMAL HIGH (ref 0.00–0.07)
Basophils Absolute: 0.2 10*3/uL (ref 0.0–0.2)
Basophils Absolute: 0.2 10*3/uL — ABNORMAL HIGH (ref 0.0–0.1)
Basophils Relative: 1 %
Basos: 1 %
EOS (ABSOLUTE): 0.5 10*3/uL — ABNORMAL HIGH (ref 0.0–0.4)
Eos: 3 %
Eosinophils Absolute: 0.8 10*3/uL — ABNORMAL HIGH (ref 0.0–0.5)
Eosinophils Relative: 5 %
HCT: 40.2 % (ref 36.0–46.0)
Hematocrit: 38.5 % (ref 34.0–46.6)
Hemoglobin: 13.1 g/dL (ref 11.1–15.9)
Hemoglobin: 13.4 g/dL (ref 12.0–15.0)
Immature Grans (Abs): 0.2 10*3/uL — ABNORMAL HIGH (ref 0.0–0.1)
Immature Granulocytes: 1 %
Immature Granulocytes: 1 %
Lymphocytes Absolute: 1.8 10*3/uL (ref 0.7–3.1)
Lymphocytes Relative: 9 %
Lymphs Abs: 1.6 10*3/uL (ref 0.7–4.0)
Lymphs: 9 %
MCH: 30.2 pg (ref 26.6–33.0)
MCH: 29.5 pg (ref 26.0–34.0)
MCHC: 34 g/dL (ref 31.5–35.7)
MCHC: 33.3 g/dL (ref 30.0–36.0)
MCV: 89 fL (ref 79–97)
MCV: 88.4 fL (ref 80.0–100.0)
Monocytes Absolute: 1.7 10*3/uL — ABNORMAL HIGH (ref 0.1–0.9)
Monocytes Absolute: 1.7 10*3/uL — ABNORMAL HIGH (ref 0.1–1.0)
Monocytes Relative: 10 %
Monocytes: 9 %
Neutro Abs: 12.5 10*3/uL — ABNORMAL HIGH (ref 1.7–7.7)
Neutrophils Absolute: 15.6 10*3/uL — ABNORMAL HIGH (ref 1.4–7.0)
Neutrophils Relative %: 74 %
Neutrophils: 77 %
Platelets: 558 10*3/uL — ABNORMAL HIGH (ref 150–450)
Platelets: 578 10*3/uL — ABNORMAL HIGH (ref 150–400)
RBC: 4.34 x10E6/uL (ref 3.77–5.28)
RBC: 4.55 MIL/uL (ref 3.87–5.11)
RDW: 13.6 % (ref 11.7–15.4)
RDW: 13.2 % (ref 11.5–15.5)
WBC: 20 10*3/uL (ref 3.4–10.8)
WBC: 16.9 10*3/uL — ABNORMAL HIGH (ref 4.0–10.5)
nRBC: 0 % (ref 0.0–0.2)

## 2019-10-17 LAB — I-STAT VENOUS BLOOD GAS, ED
Acid-Base Excess: 4 mmol/L — ABNORMAL HIGH (ref 0.0–2.0)
Bicarbonate: 26.5 mmol/L (ref 20.0–28.0)
Calcium, Ion: 1.01 mmol/L — ABNORMAL LOW (ref 1.15–1.40)
HCT: 37 % (ref 36.0–46.0)
Hemoglobin: 12.6 g/dL (ref 12.0–15.0)
O2 Saturation: 94 %
Potassium: 3.6 mmol/L (ref 3.5–5.1)
Sodium: 129 mmol/L — ABNORMAL LOW (ref 135–145)
TCO2: 27 mmol/L (ref 22–32)
pCO2, Ven: 32.1 mmHg — ABNORMAL LOW (ref 44.0–60.0)
pH, Ven: 7.525 — ABNORMAL HIGH (ref 7.250–7.430)
pO2, Ven: 61 mmHg — ABNORMAL HIGH (ref 32.0–45.0)

## 2019-10-17 LAB — COMPREHENSIVE METABOLIC PANEL
ALT: 11 U/L (ref 0–44)
AST: 17 U/L (ref 15–41)
Albumin: 3.2 g/dL — ABNORMAL LOW (ref 3.5–5.0)
Alkaline Phosphatase: 81 U/L (ref 38–126)
Anion gap: 14 (ref 5–15)
BUN: 22 mg/dL (ref 8–23)
CO2: 25 mmol/L (ref 22–32)
Calcium: 9 mg/dL (ref 8.9–10.3)
Chloride: 92 mmol/L — ABNORMAL LOW (ref 98–111)
Creatinine, Ser: 1.48 mg/dL — ABNORMAL HIGH (ref 0.44–1.00)
GFR calc Af Amer: 41 mL/min — ABNORMAL LOW (ref >60)
GFR calc non Af Amer: 36 mL/min — ABNORMAL LOW (ref >60)
Glucose, Bld: 195 mg/dL — ABNORMAL HIGH (ref 70–99)
Potassium: 3.8 mmol/L (ref 3.5–5.1)
Sodium: 131 mmol/L — ABNORMAL LOW (ref 135–145)
Total Bilirubin: 1.5 mg/dL — ABNORMAL HIGH (ref 0.3–1.2)
Total Protein: 6.3 g/dL — ABNORMAL LOW (ref 6.5–8.1)

## 2019-10-17 LAB — BRAIN NATRIURETIC PEPTIDE: B Natriuretic Peptide: 138.5 pg/mL — ABNORMAL HIGH (ref 0.0–100.0)

## 2019-10-17 LAB — TROPONIN I (HIGH SENSITIVITY)
Troponin I (High Sensitivity): 10 ng/L (ref <18)
Troponin I (High Sensitivity): 11 ng/L (ref <18)

## 2019-10-17 LAB — SARS CORONAVIRUS 2 BY RT PCR (HOSPITAL ORDER, PERFORMED IN ~~LOC~~ HOSPITAL LAB): SARS Coronavirus 2: NEGATIVE

## 2019-10-17 LAB — CBG MONITORING, ED: Glucose-Capillary: 133 mg/dL — ABNORMAL HIGH (ref 70–99)

## 2019-10-17 LAB — LACTIC ACID, PLASMA
Lactic Acid, Venous: 1.4 mmol/L (ref 0.5–1.9)
Lactic Acid, Venous: 1 mmol/L (ref 0.5–1.9)

## 2019-10-17 LAB — PROTIME-INR
INR: 1 (ref 0.8–1.2)
Prothrombin Time: 13 seconds (ref 11.4–15.2)

## 2019-10-17 LAB — LIPASE, BLOOD: Lipase: 20 U/L (ref 11–51)

## 2019-10-17 LAB — HIV ANTIBODY (ROUTINE TESTING W REFLEX): HIV Screen 4th Generation wRfx: NONREACTIVE

## 2019-10-17 MED ORDER — SODIUM CHLORIDE 0.9 % IV SOLN: Freq: Once | INTRAVENOUS | Status: DC

## 2019-10-17 MED ORDER — PAROXETINE HCL 10 MG PO TABS
10.0000 mg | ORAL_TABLET | Freq: Every day | ORAL | Status: DC
  Administered 2019-10-17 – 2019-10-19 (×3): 10 mg via ORAL
  Filled 2019-10-17 (×3): qty 1

## 2019-10-17 MED ORDER — HEPARIN SODIUM (PORCINE) 5000 UNIT/ML IJ SOLN
5000.0000 [IU] | Freq: Three times a day (TID) | INTRAMUSCULAR | Status: DC
  Administered 2019-10-17 – 2019-10-19 (×5): 5000 [IU] via SUBCUTANEOUS
  Filled 2019-10-17 (×5): qty 1

## 2019-10-17 MED ORDER — ONDANSETRON HCL 4 MG PO TABS: 4.0000 mg | ORAL_TABLET | Freq: Four times a day (QID) | ORAL | Status: DC | PRN

## 2019-10-17 MED ORDER — ALPRAZOLAM 0.25 MG PO TABS
0.2500 mg | ORAL_TABLET | Freq: Every evening | ORAL | Status: DC | PRN
  Administered 2019-10-18: 0.25 mg via ORAL
  Filled 2019-10-17: qty 1

## 2019-10-17 MED ORDER — LEVOTHYROXINE SODIUM 50 MCG PO TABS
50.0000 ug | ORAL_TABLET | Freq: Every day | ORAL | Status: DC
  Administered 2019-10-18 – 2019-10-19 (×2): 50 ug via ORAL
  Filled 2019-10-17 (×2): qty 1

## 2019-10-17 MED ORDER — ACETAMINOPHEN 650 MG RE SUPP: 650.0000 mg | Freq: Four times a day (QID) | RECTAL | Status: DC | PRN

## 2019-10-17 MED ORDER — ATORVASTATIN CALCIUM 40 MG PO TABS
40.0000 mg | ORAL_TABLET | Freq: Every day | ORAL | Status: DC
  Administered 2019-10-17 – 2019-10-18 (×2): 40 mg via ORAL
  Filled 2019-10-17 (×2): qty 1

## 2019-10-17 MED ORDER — METOPROLOL TARTRATE 5 MG/5ML IV SOLN
5.0000 mg | Freq: Once | INTRAVENOUS | Status: AC
  Administered 2019-10-18: 5 mg via INTRAVENOUS

## 2019-10-17 MED ORDER — AMIODARONE HCL 200 MG PO TABS
200.0000 mg | ORAL_TABLET | Freq: Every day | ORAL | Status: DC
  Administered 2019-10-17 – 2019-10-19 (×3): 200 mg via ORAL
  Filled 2019-10-17 (×3): qty 1

## 2019-10-17 MED ORDER — ACETAMINOPHEN 325 MG PO TABS: 650.0000 mg | ORAL_TABLET | Freq: Four times a day (QID) | ORAL | Status: DC | PRN

## 2019-10-17 MED ORDER — ONDANSETRON HCL 4 MG/2ML IJ SOLN: 4.0000 mg | Freq: Four times a day (QID) | INTRAMUSCULAR | Status: DC | PRN

## 2019-10-17 MED ORDER — LOPERAMIDE HCL 2 MG PO CAPS: 2.0000 mg | ORAL_CAPSULE | ORAL | Status: DC | PRN

## 2019-10-17 MED ORDER — SODIUM CHLORIDE 0.9 % IV SOLN: INTRAVENOUS | Status: AC

## 2019-10-17 MED ORDER — METOPROLOL TARTRATE 50 MG PO TABS
50.0000 mg | ORAL_TABLET | Freq: Two times a day (BID) | ORAL | Status: DC
  Administered 2019-10-18 – 2019-10-19 (×3): 50 mg via ORAL
  Filled 2019-10-17 (×4): qty 1

## 2019-10-17 MED ORDER — INSULIN ASPART 100 UNIT/ML ~~LOC~~ SOLN
0.0000 [IU] | Freq: Three times a day (TID) | SUBCUTANEOUS | Status: DC
  Administered 2019-10-18 (×2): 1 [IU] via SUBCUTANEOUS
  Administered 2019-10-18: 2 [IU] via SUBCUTANEOUS

## 2019-10-17 MED ORDER — ASPIRIN EC 81 MG PO TBEC
81.0000 mg | DELAYED_RELEASE_TABLET | Freq: Every day | ORAL | Status: DC
  Administered 2019-10-17 – 2019-10-19 (×3): 81 mg via ORAL
  Filled 2019-10-17 (×3): qty 1

## 2019-10-17 NOTE — Progress Notes (Signed)
   10/17/19 2342  Assess: MEWS Score  MEWS Temp 0  MEWS Systolic 0  MEWS Pulse 2  MEWS RR 0  MEWS LOC 0  MEWS Score 2  MEWS Score Color Yellow  Assess: if the MEWS score is Yellow or Red  Were vital signs taken at a resting state? Yes  Focused Assessment Documented focused assessment  Early Detection of Sepsis Score *See Row Information* Low  MEWS guidelines implemented *See Row Information* No, other (Comment) (spoke to MD, new orders)  Treat  MEWS Interventions Administered scheduled meds/treatments  Escalate  MEWS: Escalate Yellow: discuss with charge nurse/RN and consider discussing with provider and RRT  Notify: Charge Nurse/RN  Name of Charge Nurse/RN Notified [place], rn  Date Charge Nurse/RN Notified 10/17/19  Time Charge Nurse/RN Notified 2355  Notify: Provider  Provider Name/Title On call MD  Date Provider Notified 10/17/19  Time Provider Notified [phone]

## 2019-10-17 NOTE — H&P (Signed)
History and Physical    Courtney Gardner XNT:[id number] DOB: [birth year] DOA: 10/17/2019  PCP: Norwood Levo, MD  Patient coming from: Home  I have personally briefly reviewed patient's old medical records in [place]  Chief Complaint: Fatigue, abnormal labs  HPI: Courtney Gardner is a 69 y.o. female with medical history significant for CAD s/p CABG on 10/02/2019, type 2 diabetes, hypertension, hyperlipidemia, hypothyroidism, and recent postoperative paroxysmal atrial fibrillation who presents to the ED for evaluation of fatigue and abnormal labs.  Patient was recently hospitalized from 09/30/2019-10/08/2019 for evaluation of angina.  She underwent cardiac catheterization which revealed significant obstructive disease was primarily in the left main coronary artery.  She underwent CABG on 10/02/2019.  She had significant drainage from her chest tubes and reexploration was performed on 10/03/2019 without specific bleeding sites found.  Hospital stay was complicated by postoperative atrial fibrillation with RVR.  She was started on amiodarone. Creatinine was mildly elevated at 1.21 so lisinopril was not increased, amlodipine was added for blood pressure control.  She was discharged to home with cardiology follow-up.  She was seen by her cardiologist yesterday 10/16/2019.  She had blood work which showed rising creatinine up to 1.42 with WBC 20.  She was advised to present to the ED for further evaluation.  Patient states that since she had her surgery she has had lethargy/low energy and becomes easily fatigued.  She reports having 1 episode of daily watery diarrhea.  She has seen blood on tissue paper when wiping irritated skin but denies any significant bloody diarrhea.  She has had some lightheadedness without syncope or fall.  She feels as if she is eating and drinking well but does feel dehydrated.  She denies any chest pain, palpitations, dyspnea, cough, abdominal pain, nausea, vomiting, dysuria,  subjective fevers, chills, or diaphoresis.  ED Course:  In the ED initial vitals showed BP 139/75, pulse 58, RR 15, temp 97.6 Fahrenheit, SPO2 97% on room air.  Labs are notable for sodium 131, potassium 3.8, bicarb 25, BUN 22, creatinine 1.48 (baseline ~1.0), AST 17, ALT 11, alk phos 81, total bilirubin 1.5, WBC 16.9, hemoglobin 13.4, platelets 578,000, lactic acid 1.4 >> 1.0, BNP 138.5, lipase 20, high-sensitivity troponin I 10 >> 11.  VBG showed pH 7.525, PCO2 32.1, PO2 61.  Urinalysis is ordered and pending.  SARS-CoV-2 PCR is obtained and pending.  Portable chest x-ray showed increase in left base collapse/consolidation with effusion.  CT chest without contrast showed moderate sized left pleural effusion and mild dependent atelectasis.  New left perihilar soft tissue with narrowing of the lower lobe bronchus was seen.  EDP discussed case with on-call cardiology who recommended gentle IV fluid hydration and following infusion.  Patient was started on maintenance IV fluids and the hospitalist service was consulted for further evaluation management.  Review of Systems: All systems reviewed and are negative except as documented in history of present illness above.   Past Medical History:  Diagnosis Date  . Angina pectoris ([place]) 11/20/2017  . Coronary artery disease involving native coronary artery of native heart 12/26/2017  . Diabetes mellitus without complication ([place])   . Essential hypertension 11/20/2017  . Ex-smoker 11/20/2017  . Hyperlipidemia 11/20/2017  . Iliotibial band syndrome of left side 09/09/2015    Past Surgical History:  Procedure Laterality Date  . CHOLECYSTECTOMY    . CORONARY ARTERY BYPASS GRAFT N/A 10/02/2019   Procedure: CORONARY ARTERY BYPASS GRAFTING (CABG) TIMES TWO, ON PUMP, USING LEFT INTERNAL MAMMARY  ARTERY AND RIGHT GREATER SAPHENOUS VEIN HARVESTED ENDOSCOPICALLY;  Surgeon: [name], MD;  Location: [facility];  Service: Open Heart Surgery;  Laterality:  N/A;  LIMA to LAD, SVG to OM 1  . INTRAVASCULAR PRESSURE WIRE/FFR STUDY N/A 11/23/2017   Procedure: INTRAVASCULAR PRESSURE WIRE/FFR STUDY;  Surgeon: Jettie Booze, MD;  Location: [facility];  Service: Cardiovascular;  Laterality: N/A;  . LEFT HEART CATH AND CORONARY ANGIOGRAPHY N/A 11/23/2017   Procedure: LEFT HEART CATH AND CORONARY ANGIOGRAPHY;  Surgeon: Jettie Booze, MD;  Location: [facility];  Service: Cardiovascular;  Laterality: N/A;  . LEFT HEART CATH AND CORONARY ANGIOGRAPHY N/A 09/30/2019   Procedure: LEFT HEART CATH AND CORONARY ANGIOGRAPHY;  Surgeon: Troy Sine, MD;  Location: [facility];  Service: Cardiovascular;  Laterality: N/A;  . MEDIASTINAL EXPLORATION N/A 10/03/2019   Procedure: MEDIASTINAL WASHOUT;  Surgeon: [name], MD;  Location: [facility];  Service: Thoracic;  Laterality: N/A;  . TEE WITHOUT CARDIOVERSION N/A 10/02/2019   Procedure: TRANSESOPHAGEAL ECHOCARDIOGRAM (TEE);  Surgeon: [name], MD;  Location: [facility];  Service: Open Heart Surgery;  Laterality: N/A;  . TUBAL LIGATION      Social History:  reports that she has quit smoking. She has never used smokeless tobacco. She reports previous alcohol use. She reports that she does not use drugs.  No Known Allergies  Family History  Problem Relation Age of Onset  . Leukemia Mother   . Lung cancer Father   . Hypertension Sister   . Heart attack Maternal Grandmother   . Heart attack Maternal Grandfather   . Heart attack Maternal Uncle      Prior to Admission medications   Medication Sig Start Date End Date Taking? Authorizing Provider  acetaminophen (TYLENOL) 500 MG tablet Take 2 tablets (1,000 mg total) by mouth every 6 (six) hours as needed. 10/07/19  Yes Barrett, Erin R, PA-C  ALPRAZolam (XANAX) 0.25 MG tablet Take 1 tablet (0.25 mg total) by mouth at bedtime as needed for anxiety. 09/25/19  Yes Revankar, Reita Cliche, MD  amiodarone (PACERONE) 200 MG tablet Take 1  tablet (200 mg total) by mouth daily. 10/08/19  Yes Lars Pinks M, PA-C  amLODipine (NORVASC) 10 MG tablet Take 1 tablet (10 mg total) by mouth daily. 10/08/19  Yes Nani Skillern, PA-C  aspirin EC 81 MG tablet Take 1 tablet (81 mg total) by mouth daily. 09/25/19  Yes Revankar, Reita Cliche, MD  atorvastatin (LIPITOR) 40 MG tablet Take 40 mg by mouth at bedtime.    Yes [provider]  levothyroxine (SYNTHROID, LEVOTHROID) 50 MCG tablet Take 50 mcg by mouth daily before breakfast.  11/12/17  Yes [provider]  loperamide (IMODIUM A-D) 2 MG tablet Take 1 tablet (2 mg total) by mouth 4 (four) times daily as needed for diarrhea or loose stools. 10/13/19  Yes Conte, Tessa N, PA-C  metFORMIN (GLUCOPHAGE) 500 MG tablet Take 500 mg by mouth at bedtime.  07/21/19  Yes [provider]  metoprolol tartrate (LOPRESSOR) 50 MG tablet Take 1 tablet (50 mg total) by mouth 2 (two) times daily. 10/08/19  Yes Lars Pinks M, PA-C  nitroGLYCERIN (NITROSTAT) 0.4 MG SL tablet Place 1 tablet (0.4 mg total) under the tongue every 5 (five) minutes as needed for chest pain. 08/12/19 11/10/19 Yes Revankar, Reita Cliche, MD  omeprazole (PRILOSEC) 40 MG capsule Take 40 mg by mouth daily. 06/29/19  Yes [provider]  ondansetron (  ZOFRAN) 4 MG tablet Take 1 tablet (4 mg total) by mouth every 8 (eight) hours as needed for nausea or vomiting. 10/13/19  Yes Harriet Pho, Tessa N, PA-C  PARoxetine (PAXIL) 10 MG tablet Take 10 mg by mouth daily. 06/29/19  Yes [provider]  lisinopril (ZESTRIL) 20 MG tablet Take 20 mg by mouth daily. 09/19/19   [provider]    Physical Exam: Vitals:   10/17/19 1109 10/17/19 1110 10/17/19 1111 10/17/19 1112  BP:      Pulse: (!) 57 (!) 57 (!) 57   Resp: 17 (!) 26 15   Temp:    97.6 F (36.4 C)  SpO2: 96% 97% 97%    Constitutional: Resting supine in bed, NAD, calm, appears tired but comfortable Eyes: PERRL, lids and conjunctivae normal ENMT:  Mucous membranes are dry. Posterior pharynx clear of any exudate or lesions.Normal dentition.  Neck: normal, supple, no masses. Respiratory: Diminished breath sounds left lung base otherwise clear to auscultation. Normal respiratory effort. No accessory muscle use.  Cardiovascular: Regular rate and rhythm, soft systolic murmur present. No extremity edema. 2+ pedal pulses.  Well-healing sternotomy surgical wound. Abdomen: no tenderness, no masses palpated. No hepatosplenomegaly. Bowel sounds positive.  Musculoskeletal: no clubbing / cyanosis. No joint deformity upper and lower extremities. Good ROM, no contractures. Normal muscle tone.  Skin: Well-healing sternotomy surgical wound.  No rashes, lesions, ulcers. No induration Neurologic: CN 2-12 grossly intact. Sensation intact, Strength 5/5 in all 4.  Psychiatric: Normal judgment and insight. Alert and oriented x 3. Normal mood.     Labs on Admission: I have personally reviewed following labs and imaging studies  CBC: Recent Labs  Lab 10/16/19 1621 10/17/19 1151 10/17/19 1400  WBC 20.0* 16.9*  --   NEUTROABS 15.6* 12.5*  --   HGB 13.1 13.4 12.6  HCT 38.5 40.2 37.0  MCV 89 88.4  --   PLT 558* 578*  --    Basic Metabolic Panel: Recent Labs  Lab 10/16/19 1621 10/17/19 1151 10/17/19 1400  NA 128* 131* 129*  K 4.4 3.8 3.6  CL 87* 92*  --   CO2 21 25  --   GLUCOSE 217* 195*  --   BUN 25 22  --   CREATININE 1.42* 1.48*  --   CALCIUM 8.8 9.0  --    GFR: Estimated Creatinine Clearance: 33.1 mL/min (A) (by C-G formula based on SCr of 1.48 mg/dL (H)). Liver Function Tests: Recent Labs  Lab 10/17/19 1151  AST 17  ALT 11  ALKPHOS 81  BILITOT 1.5*  PROT 6.3*  ALBUMIN 3.2*   Recent Labs  Lab 10/17/19 1151  LIPASE 20   No results for input(s): AMMONIA in the last 168 hours. Coagulation Profile: Recent Labs  Lab 10/17/19 1151  INR 1.0   Cardiac Enzymes: No results for input(s): CKTOTAL, CKMB, CKMBINDEX, TROPONINI in  the last 168 hours. BNP (last 3 results) No results for input(s): PROBNP in the last 8760 hours. HbA1C: No results for input(s): HGBA1C in the last 72 hours. CBG: No results for input(s): GLUCAP in the last 168 hours. Lipid Profile: No results for input(s): CHOL, HDL, LDLCALC, TRIG, CHOLHDL, LDLDIRECT in the last 72 hours. Thyroid Function Tests: No results for input(s): TSH, T4TOTAL, FREET4, T3FREE, THYROIDAB in the last 72 hours. Anemia Panel: No results for input(s): VITAMINB12, FOLATE, FERRITIN, TIBC, IRON, RETICCTPCT in the last 72 hours. Urine analysis:    Component Value Date/Time   COLORURINE YELLOW 10/02/2019 0434  APPEARANCEUR CLEAR 10/02/2019 0434   LABSPEC 1.010 10/02/2019 0434   PHURINE 5.0 10/02/2019 0434   GLUCOSEU NEGATIVE 10/02/2019 0434   HGBUR NEGATIVE 10/02/2019 0434   BILIRUBINUR NEGATIVE 10/02/2019 0434   KETONESUR 20 (A) 10/02/2019 0434   PROTEINUR NEGATIVE 10/02/2019 0434   NITRITE NEGATIVE 10/02/2019 0434   LEUKOCYTESUR SMALL (A) 10/02/2019 0434    Radiological Exams on Admission: CT Chest Wo Contrast  Result Date: 10/17/2019 CLINICAL DATA:  Shortness of breath.  Abnormal chest x-ray. EXAM: CT CHEST WITHOUT CONTRAST TECHNIQUE: Multidetector CT imaging of the chest was performed following the standard protocol without IV contrast. COMPARISON:  CT heart 09/24/2019. FINDINGS: Cardiovascular: Heart size normal. Extensive coronary artery calcifications are again seen. No significant pericardial effusion present. Atherosclerotic changes are present at the aortic arch and great vessel origins without aneurysm or stenosis. Pulmonary arteries are within normal limits. Mediastinum/Nodes: Left perihilar soft tissue is new since the prior exam. There is narrowing of the lower lobe bronchus other significant adenopathy is present. Lungs/Pleura: Moderate size left pleural effusion present. Mild dependent atelectasis present left. The right lung is clear. Upper Abdomen:  Cholecystectomy is noted. No solid organ lesions are present. Vascular calcifications are evident. Musculoskeletal: Vertebral body heights and alignment are normal. Median sternotomy is noted. Ribs are within normal limits. IMPRESSION: 1. Moderate size left pleural effusion and mild dependent atelectasis. 2. New left perihilar soft tissue with narrowing of the lower lobe bronchus. While this may represent atelectasis, follow-up CT of the chest with contrast is recommended for further evaluation. 3. Coronary artery disease. 4. Cholecystectomy. 5. Aortic Atherosclerosis (ICD10-I70.0). Electronically Signed   By: San Morelle M.D.   On: 10/17/2019 17:47   DG Chest Port 1 View  Result Date: 10/17/2019 CLINICAL DATA:  Shortness of breath and weakness. EXAM: PORTABLE CHEST 1 VIEW COMPARISON:  10/06/2019 FINDINGS: 1210 hours. Cardiopericardial silhouette is at upper limits of normal for size. Interval progression of left base collapse/consolidative opacity with small left pleural effusion. Right lung clear. Bones are diffusely demineralized. Telemetry leads overlie the chest. IMPRESSION: Interval increase in left base collapse/consolidation with small effusion. Electronically Signed   By: Misty Stanley M.D.   On: 10/17/2019 12:40    EKG: Independently reviewed. Sinus arrhythmia, QTC 514.  Arrhythmia new when compared to prior.  Assessment/Plan Principal Problem:   AKI (acute kidney injury) ([place]) Active Problems:   Hypertension associated with diabetes ([place])   Coronary artery disease involving native coronary artery of native heart   Diabetes ([facility])   Pleural effusion on left   Hyperlipidemia associated with type 2 diabetes mellitus ([facility])   Hypothyroidism  Courtney Gardner is a 69 y.o. female with medical history significant for CAD s/p CABG on 10/02/2019, type 2 diabetes, hypertension, hyperlipidemia, hypothyroidism, and recent postoperative paroxysmal atrial fibrillation who is admitted with  AKI.  Acute kidney injury: Likely secondary to dehydration with some medication effect.  Appears hypovolemic on admission. -Continue gentle IV fluid hydration with NS '@100'  mL/hr overnight -Hold home lisinopril and Metformin -Repeat labs in the morning  Loose stools: Reports watery bowel movements occurring once per day.  Given low frequency low suspicion for C. difficile or other infectious etiology.  If having increased frequency can send for stool testing.  Continue with gentle IV fluid hydration and as needed Imodium for now.  Left-sided pleural effusion: Moderate size seen on CT imaging.  Denies any respiratory symptoms and is not currently requiring oxygen.  Continue monitor respiratory status with ongoing IV fluid  hydration.  Left perihilar soft tissue:  Seen on CT imaging with narrowing of the lower lobe bronchus.  Follow-up CT chest with contrast recommended in the future, deferred at this time due to AKI.  CAD s/p CABG 10/02/2019: Stable, denies any chest pain. -Continue aspirin 81 mg daily and atorvastatin -Holding Lopressor given soft blood pressures  Postoperative paroxysmal atrial fibrillation: Currently stable, not on anticoagulation. -Continue amiodarone  Type 2 diabetes: Hold home Metformin with AKI.  Place on sensitive SSI while in hospital.  Hypertension: Holding home amlodipine, Lopressor, and lisinopril given soft blood pressure.  Hyperlipidemia: Continue atorvastatin.  Hypothyroidism: TSH 1.700 on 09/11/2019.  Continue Synthroid.  Generalized weakness/deconditioning: Continue IV fluid hydration and request PT eval.  DVT prophylaxis: Subcutaneous heparin Code Status: Full code, confirmed with patient Family Communication: Discussed with patient, she has discussed with family Disposition Plan: From home and likely discharge to home pending PT eval Consults called: EDP discussed with cardiology Admission status:  Status is: Observation  The patient  remains OBS appropriate and will d/c before 2 midnights.  Dispo: The patient is from: Home              Anticipated d/c is to: Home              Anticipated d/c date is: 1 day pending improving renal function and PT eval             Patient currently is not medically stable to d/c.   Zada Finders MD [facility]  If 7PM-7AM, please contact night-coverage [email or web address]  10/17/2019, 7:06 PM

## 2019-10-17 NOTE — Telephone Encounter (Signed)
Spoke with pt's roommate Jenny Reichmann and advised her to have Ms Gick see her PCP this am or go to the ED for evaluation of her WBC being elevated. Cindy verbalized understanding and had no additional questions.

## 2019-10-17 NOTE — ED Triage Notes (Signed)
Pt called out for SOB and weakness, post cabbage on the 3rd of June. Some redness and pain at the insicion site, afebrile. Called cardiologist, recommended patient come in for evaluation.  Hypotensive, as low as 90/40 and up to 108/68. Diabetic, CBG 171.

## 2019-10-17 NOTE — ED Provider Notes (Signed)
[facility] EMERGENCY DEPARTMENT Provider Note   CSN: [id number] Arrival date & time: 10/17/19  1053     History Chief Complaint  Patient presents with  . Shortness of Breath  . Weakness    Courtney Gardner is a 69 y.o. female.  HPI Patient describes persistent fatigue and shortness of breath since her cardiac bypass surgery.  She reports she is especially short of breath with exertion.  She denies she is having a significant bout of chest pain.  He has however dealt with persistent diarrhea as well.  No abdominal pain.  No fevers or chills.  He was seen yesterday at [facility] by Dr. Geraldo Pitter.  At that time patient seemed to be recovering.  Plan to continue symptomatic management and recommendation to be seen in emergency department if she felt that she was getting worsening symptoms.  Patient reports her diarrheal illness preceded her CABG.  This is been a longstanding problem.    Past Medical History:  Diagnosis Date  . Angina pectoris ([place]) 11/20/2017  . Coronary artery disease involving native coronary artery of native heart 12/26/2017  . Diabetes mellitus without complication ([place])   . Essential hypertension 11/20/2017  . Ex-smoker 11/20/2017  . Hyperlipidemia 11/20/2017  . Iliotibial band syndrome of left side 09/09/2015    Patient Active Problem List   Diagnosis Date Noted  . Diabetes mellitus due to underlying condition with unspecified complications ([place]) 11/57/2620  . Diabetes ([place]) 09/30/2019  . Glaucoma 09/30/2019  . Barrett's esophagus 09/30/2019  . Renal calculi 09/30/2019  . Palpitations 08/12/2019  . Coronary artery disease involving native coronary artery of native heart 12/26/2017  . Hyperlipidemia 11/20/2017  . Angina pectoris ([place]) 11/20/2017  . Ex-smoker 11/20/2017  . Iliotibial band syndrome of left side 09/09/2015    Past Surgical History:  Procedure Laterality Date  . CHOLECYSTECTOMY    . CORONARY ARTERY  BYPASS GRAFT N/A 10/02/2019   Procedure: CORONARY ARTERY BYPASS GRAFTING (CABG) TIMES TWO, ON PUMP, USING LEFT INTERNAL MAMMARY ARTERY AND RIGHT GREATER SAPHENOUS VEIN HARVESTED ENDOSCOPICALLY;  Surgeon: [name], MD;  Location: [facility];  Service: Open Heart Surgery;  Laterality: N/A;  LIMA to LAD, SVG to OM 1  . INTRAVASCULAR PRESSURE WIRE/FFR STUDY N/A 11/23/2017   Procedure: INTRAVASCULAR PRESSURE WIRE/FFR STUDY;  Surgeon: Jettie Booze, MD;  Location: [facility];  Service: Cardiovascular;  Laterality: N/A;  . LEFT HEART CATH AND CORONARY ANGIOGRAPHY N/A 11/23/2017   Procedure: LEFT HEART CATH AND CORONARY ANGIOGRAPHY;  Surgeon: Jettie Booze, MD;  Location: [facility];  Service: Cardiovascular;  Laterality: N/A;  . LEFT HEART CATH AND CORONARY ANGIOGRAPHY N/A 09/30/2019   Procedure: LEFT HEART CATH AND CORONARY ANGIOGRAPHY;  Surgeon: Troy Sine, MD;  Location: [facility];  Service: Cardiovascular;  Laterality: N/A;  . MEDIASTINAL EXPLORATION N/A 10/03/2019   Procedure: MEDIASTINAL WASHOUT;  Surgeon: [name], MD;  Location: [facility];  Service: Thoracic;  Laterality: N/A;  . TEE WITHOUT CARDIOVERSION N/A 10/02/2019   Procedure: TRANSESOPHAGEAL ECHOCARDIOGRAM (TEE);  Surgeon: [name], MD;  Location: [facility];  Service: Open Heart Surgery;  Laterality: N/A;  . TUBAL LIGATION       OB History   No obstetric history on file.     Family History  Problem Relation Age of Onset  . Leukemia Mother   . Lung cancer Father   . Hypertension Sister   . Heart  attack Maternal Grandmother   . Heart attack Maternal Grandfather   . Heart attack Maternal Uncle     Social History   Tobacco Use  . Smoking status: Former Research scientist (life sciences)  . Smokeless tobacco: Never Used  Vaping Use  . Vaping Use: Never used  Substance Use Topics  . Alcohol use: Not Currently  . Drug use: Never    Home Medications Prior to Admission medications   Medication  Sig Start Date End Date Taking? Authorizing Provider  acetaminophen (TYLENOL) 500 MG tablet Take 2 tablets (1,000 mg total) by mouth every 6 (six) hours as needed. 10/07/19  Yes Barrett, Erin R, PA-C  ALPRAZolam (XANAX) 0.25 MG tablet Take 1 tablet (0.25 mg total) by mouth at bedtime as needed for anxiety. 09/25/19  Yes Revankar, Reita Cliche, MD  amiodarone (PACERONE) 200 MG tablet Take 1 tablet (200 mg total) by mouth daily. 10/08/19  Yes Lars Pinks M, PA-C  amLODipine (NORVASC) 10 MG tablet Take 1 tablet (10 mg total) by mouth daily. 10/08/19  Yes Nani Skillern, PA-C  aspirin EC 81 MG tablet Take 1 tablet (81 mg total) by mouth daily. 09/25/19  Yes Revankar, Reita Cliche, MD  atorvastatin (LIPITOR) 40 MG tablet Take 40 mg by mouth at bedtime.    Yes [provider]  levothyroxine (SYNTHROID, LEVOTHROID) 50 MCG tablet Take 50 mcg by mouth daily before breakfast.  11/12/17  Yes [provider]  loperamide (IMODIUM A-D) 2 MG tablet Take 1 tablet (2 mg total) by mouth 4 (four) times daily as needed for diarrhea or loose stools. 10/13/19  Yes Conte, Tessa N, PA-C  metFORMIN (GLUCOPHAGE) 500 MG tablet Take 500 mg by mouth at bedtime.  07/21/19  Yes [provider]  metoprolol tartrate (LOPRESSOR) 50 MG tablet Take 1 tablet (50 mg total) by mouth 2 (two) times daily. 10/08/19  Yes Lars Pinks M, PA-C  nitroGLYCERIN (NITROSTAT) 0.4 MG SL tablet Place 1 tablet (0.4 mg total) under the tongue every 5 (five) minutes as needed for chest pain. 08/12/19 11/10/19 Yes Revankar, Reita Cliche, MD  omeprazole (PRILOSEC) 40 MG capsule Take 40 mg by mouth daily. 06/29/19  Yes [provider]  ondansetron (ZOFRAN) 4 MG tablet Take 1 tablet (4 mg total) by mouth every 8 (eight) hours as needed for nausea or vomiting. 10/13/19  Yes Harriet Pho, Tessa N, PA-C  PARoxetine (PAXIL) 10 MG tablet Take 10 mg by mouth daily. 06/29/19  Yes [provider]  lisinopril (ZESTRIL) 20 MG tablet Take 20 mg  by mouth daily. 09/19/19   [provider]    Allergies    Patient has no known allergies.  Review of Systems   Review of Systems 10 Systems reviewed and negative except as per HPI Physical Exam Updated Vital Signs BP 139/75   Pulse (!) 57   Temp 97.6 F (36.4 C)   Resp 15   SpO2 97%   Physical Exam Constitutional:      Comments: Patient pale in appearance.  She appears fatigued and deconditioned.  No respiratory distress at rest.  HENT:     Mouth/Throat:     Mouth: Mucous membranes are dry.  Eyes:     Extraocular Movements: Extraocular movements intact.     Pupils: Pupils are equal, round, and reactive to light.     Comments: Conjunctiva pale  Cardiovascular:     Comments: Heart regular.  No gross rub murmur gallop. Pulmonary:     Comments: Chest wall has healing sternotomy  incision.  Eschar still present.  There is redness approximately 1 cm around the scar.  Very diminished breath sounds left mid to lower lung fields.  Right mid to lower lung fields rub present. Abdominal:     General: There is no distension.     Palpations: Abdomen is soft.     Tenderness: There is no abdominal tenderness. There is no guarding.  Musculoskeletal:     Cervical back: Neck supple.     Comments: No peripheral edema.  Vein harvest sites are healing well in the legs.  No surrounding erythema.  No significant peripheral edema calves are soft  Skin:    General: Skin is warm and dry.     Coloration: Skin is pale.  Neurological:     General: No focal deficit present.     Mental Status: She is oriented to person, place, and time.     Cranial Nerves: No cranial nerve deficit.     Coordination: Coordination normal.     ED Results / Procedures / Treatments   Labs (all labs ordered are listed, but only abnormal results are displayed) Labs Reviewed  COMPREHENSIVE METABOLIC PANEL - Abnormal; Notable for the following components:      Result Value   Sodium 131 (*)    Chloride 92 (*)     Glucose, Bld 195 (*)    Creatinine, Ser 1.48 (*)    Total Protein 6.3 (*)    Albumin 3.2 (*)    Total Bilirubin 1.5 (*)    GFR calc non Af Amer 36 (*)    GFR calc Af Amer 41 (*)    All other components within normal limits  CBC WITH DIFFERENTIAL/PLATELET - Abnormal; Notable for the following components:   WBC 16.9 (*)    Platelets 578 (*)    Neutro Abs 12.5 (*)    Monocytes Absolute 1.7 (*)    Eosinophils Absolute 0.8 (*)    Basophils Absolute 0.2 (*)    Abs Immature Granulocytes 0.17 (*)    All other components within normal limits  I-STAT VENOUS BLOOD GAS, ED - Abnormal; Notable for the following components:   pH, Ven 7.525 (*)    pCO2, Ven 32.1 (*)    pO2, Ven 61.0 (*)    Acid-Base Excess 4.0 (*)    Sodium 129 (*)    Calcium, Ion 1.01 (*)    All other components within normal limits  LIPASE, BLOOD  LACTIC ACID, PLASMA  LACTIC ACID, PLASMA  PROTIME-INR  BRAIN NATRIURETIC PEPTIDE  URINALYSIS, ROUTINE W REFLEX MICROSCOPIC  BLOOD GAS, VENOUS  TROPONIN I (HIGH SENSITIVITY)  TROPONIN I (HIGH SENSITIVITY)    EKG EKG Interpretation  Date/Time:  Friday October 17 2019 11:03:35 EDT Ventricular Rate:  60 PR Interval:    QRS Duration: 99 QT Interval:  524 QTC Calculation: 524 R Axis:   23 Text Interpretation: Sinus rhythm Probable left atrial enlargement Prolonged QT interval agree, anterior t wave inversion new compared to most recent comparison Confirmed by Charlesetta Shanks [phone]) on 10/17/2019 11:29:36 AM   Radiology DG Chest Port 1 View  Result Date: 10/17/2019 CLINICAL DATA:  Shortness of breath and weakness. EXAM: PORTABLE CHEST 1 VIEW COMPARISON:  10/06/2019 FINDINGS: 1210 hours. Cardiopericardial silhouette is at upper limits of normal for size. Interval progression of left base collapse/consolidative opacity with small left pleural effusion. Right lung clear. Bones are diffusely demineralized. Telemetry leads overlie the chest. IMPRESSION: Interval increase in left  base collapse/consolidation with small effusion. Electronically Signed  By: Misty Stanley M.D.   On: 10/17/2019 12:40    Procedures Procedures (including critical care time)  Medications Ordered in ED Medications - No data to display  ED Course  I have reviewed the triage vital signs and the nursing notes.  Pertinent labs & imaging results that were available during my care of the patient were reviewed by me and considered in my medical decision making (see chart for details).    MDM Rules/Calculators/A&P                         Patient is presenting with persisting symptoms of general fatigue and weakness as well as shortness of breath after CABG.  He has had diarrheal illness however this has been longstanding and preceded her bypass.  Patient does have increasing pleural effusion.  On physical exam breath sounds are very soft on the left.  We will proceed with a CT noncontrast of the chest to evaluate for size of effusion.  Dr. Roslynn Amble to follow-up on results for final disposition. Final Clinical Impression(s) / ED Diagnoses Final diagnoses:  None    Rx / DC Orders ED Discharge Orders    None       Charlesetta Shanks, MD 10/17/19 [phone]

## 2019-10-17 NOTE — ED Notes (Signed)
Pt ambulated without difficulty, sats did not go below 99

## 2019-10-17 NOTE — ED Provider Notes (Signed)
Signout note  69 year old lady recent CABG presenting to ER with concern for diarrhea, dehydration, abnormal labs.  Seen in clinic yesterday at cardiology office, CBC showed no's leukocytosis, BMP demonstrating elevated creatinine.  Repeat creatinine today remains mildly elevated, WBC is improved to 16.9.  CXR demonstrating small left pleural effusion, CT scan ordered to further evaluate.  Received signout from Dr. Vallery Ridge, follow-up CT, reassess patient, likely consult cardiology  Reviewed results of CT scan, moderate left-sided pleural effusion, new left perihilar soft tissue, radiologist recommending CT chest with contrast for further evaluation  Reviewed case with cardiology on-call, Dr. Harrell Gave. Recommends gentle rehydration, monitoring of effusion and hospitalist admission, no specific cardiac concerns. Discussed with Posey Pronto. Started mIVF. Updated patient and daughter. Notified of need for CT Chest with contrast at future date but due to AKI, will defer for now.    Lucrezia Starch, MD 10/17/19 [phone]

## 2019-10-18 DIAGNOSIS — Z8249 Family history of ischemic heart disease and other diseases of the circulatory system: Secondary | ICD-10-CM | POA: Diagnosis not present

## 2019-10-18 DIAGNOSIS — Z79890 Hormone replacement therapy (postmenopausal): Secondary | ICD-10-CM | POA: Diagnosis not present

## 2019-10-18 DIAGNOSIS — E876 Hypokalemia: Secondary | ICD-10-CM | POA: Diagnosis present

## 2019-10-18 DIAGNOSIS — Z20822 Contact with and (suspected) exposure to covid-19: Secondary | ICD-10-CM | POA: Diagnosis present

## 2019-10-18 DIAGNOSIS — Z87891 Personal history of nicotine dependence: Secondary | ICD-10-CM | POA: Diagnosis not present

## 2019-10-18 DIAGNOSIS — I152 Hypertension secondary to endocrine disorders: Secondary | ICD-10-CM | POA: Diagnosis present

## 2019-10-18 DIAGNOSIS — N179 Acute kidney failure, unspecified: Secondary | ICD-10-CM | POA: Diagnosis present

## 2019-10-18 DIAGNOSIS — E871 Hypo-osmolality and hyponatremia: Secondary | ICD-10-CM | POA: Diagnosis present

## 2019-10-18 DIAGNOSIS — Z79899 Other long term (current) drug therapy: Secondary | ICD-10-CM | POA: Diagnosis not present

## 2019-10-18 DIAGNOSIS — Z9049 Acquired absence of other specified parts of digestive tract: Secondary | ICD-10-CM | POA: Diagnosis not present

## 2019-10-18 DIAGNOSIS — E785 Hyperlipidemia, unspecified: Secondary | ICD-10-CM | POA: Diagnosis present

## 2019-10-18 DIAGNOSIS — E1169 Type 2 diabetes mellitus with other specified complication: Secondary | ICD-10-CM | POA: Diagnosis present

## 2019-10-18 DIAGNOSIS — E860 Dehydration: Secondary | ICD-10-CM | POA: Diagnosis present

## 2019-10-18 DIAGNOSIS — I2510 Atherosclerotic heart disease of native coronary artery without angina pectoris: Secondary | ICD-10-CM | POA: Diagnosis present

## 2019-10-18 DIAGNOSIS — J90 Pleural effusion, not elsewhere classified: Secondary | ICD-10-CM | POA: Diagnosis present

## 2019-10-18 DIAGNOSIS — Z7982 Long term (current) use of aspirin: Secondary | ICD-10-CM | POA: Diagnosis not present

## 2019-10-18 DIAGNOSIS — E039 Hypothyroidism, unspecified: Secondary | ICD-10-CM | POA: Diagnosis present

## 2019-10-18 DIAGNOSIS — Z7984 Long term (current) use of oral hypoglycemic drugs: Secondary | ICD-10-CM | POA: Diagnosis not present

## 2019-10-18 DIAGNOSIS — I480 Paroxysmal atrial fibrillation: Secondary | ICD-10-CM | POA: Diagnosis present

## 2019-10-18 DIAGNOSIS — Z951 Presence of aortocoronary bypass graft: Secondary | ICD-10-CM | POA: Diagnosis not present

## 2019-10-18 DIAGNOSIS — H409 Unspecified glaucoma: Secondary | ICD-10-CM | POA: Diagnosis present

## 2019-10-18 LAB — GLUCOSE, CAPILLARY
Glucose-Capillary: 164 mg/dL — ABNORMAL HIGH (ref 70–99)
Glucose-Capillary: 130 mg/dL — ABNORMAL HIGH (ref 70–99)
Glucose-Capillary: 136 mg/dL — ABNORMAL HIGH (ref 70–99)

## 2019-10-18 LAB — CBC
HCT: 36.3 % (ref 36.0–46.0)
Hemoglobin: 12.2 g/dL (ref 12.0–15.0)
MCH: 29.9 pg (ref 26.0–34.0)
MCHC: 33.6 g/dL (ref 30.0–36.0)
MCV: 89 fL (ref 80.0–100.0)
Platelets: 508 10*3/uL — ABNORMAL HIGH (ref 150–400)
RBC: 4.08 MIL/uL (ref 3.87–5.11)
RDW: 13.4 % (ref 11.5–15.5)
WBC: 15 10*3/uL — ABNORMAL HIGH (ref 4.0–10.5)
nRBC: 0 % (ref 0.0–0.2)

## 2019-10-18 LAB — BASIC METABOLIC PANEL
Anion gap: 12 (ref 5–15)
BUN: 21 mg/dL (ref 8–23)
CO2: 24 mmol/L (ref 22–32)
Calcium: 8.4 mg/dL — ABNORMAL LOW (ref 8.9–10.3)
Chloride: 97 mmol/L — ABNORMAL LOW (ref 98–111)
Creatinine, Ser: 1.33 mg/dL — ABNORMAL HIGH (ref 0.44–1.00)
GFR calc Af Amer: 47 mL/min — ABNORMAL LOW (ref >60)
GFR calc non Af Amer: 40 mL/min — ABNORMAL LOW (ref >60)
Glucose, Bld: 151 mg/dL — ABNORMAL HIGH (ref 70–99)
Potassium: 3.3 mmol/L — ABNORMAL LOW (ref 3.5–5.1)
Sodium: 133 mmol/L — ABNORMAL LOW (ref 135–145)

## 2019-10-18 LAB — CORTISOL: Cortisol, Plasma: 18.7 ug/dL

## 2019-10-18 LAB — PROCALCITONIN: Procalcitonin: <0.1 ng/mL

## 2019-10-18 LAB — TSH: TSH: 3.685 u[IU]/mL (ref 0.350–4.500)

## 2019-10-18 LAB — SODIUM, URINE, RANDOM: Sodium, Ur: 15 mmol/L

## 2019-10-18 MED ORDER — POTASSIUM CHLORIDE CRYS ER 20 MEQ PO TBCR
40.0000 meq | EXTENDED_RELEASE_TABLET | Freq: Once | ORAL | Status: AC
  Administered 2019-10-18: 40 meq via ORAL
  Filled 2019-10-18: qty 2

## 2019-10-18 MED ORDER — SODIUM CHLORIDE 0.9 % IV SOLN: INTRAVENOUS | Status: DC

## 2019-10-18 MED ORDER — METOPROLOL TARTRATE 5 MG/5ML IV SOLN
INTRAVENOUS | Status: AC
  Filled 2019-10-18: qty 5

## 2019-10-18 NOTE — Progress Notes (Signed)
PROGRESS NOTE    Courtney Gardner  POE:[id number] DOB: 03/11/1951 DOA: 10/17/2019 PCP: Norwood Levo, MD  Outpatient Specialists:     Brief Narrative:  Patient is a 69 year old Caucasian female with past medical history significant for CAD s/p CABG on 10/02/2019, type 2 diabetes, hypertension, hyperlipidemia, hypothyroidism, and recent postoperative paroxysmal atrial fibrillation.  Patient was seen at the primary care provider's office where lab work done revealed significant electrolyte abnormalities.  Patient was also feeling very weak and fatigued.  On presentation to the hospital, sodium was noted to be 128, with serum creatinine of 1.42.  Prior serum creatinine level was 1.04-1.21.  PCPs office called patient to come to the hospital for further assessment and management.  On further questioning, patient endorsed significant diarrhea following the surgery.  Patient was also on diuretics for 5 days.  10/18/2019: Patient seen.  Patient is feeling a bit better.  Potassium is 3.3, sodium is up to 133 and serum creatinine is 1.33.  Serum creatinine peaked at 1.48.  We will continue IV fluids for now.  We will continue to monitor and correct electrolytes.  We will consult physical therapy.  Hopefully, patient will be discharged in the next 24 to 48 hours.  Will change patient to inpatient.  Assessment & Plan:   Principal Problem:   AKI (acute kidney injury) ([place]) Active Problems:   Hypertension associated with diabetes ([place])   Coronary artery disease involving native coronary artery of native heart   Diabetes ([facility])   Pleural effusion on left   Hyperlipidemia associated with type 2 diabetes mellitus ([facility])   Hypothyroidism   Paroxysmal atrial fibrillation ([place])  Acute kidney injury: -Likely secondary to volume depletion.   -According to the patient, watery stools have improved significantly.  Patient reports having only 2 watery stools daily. -Continue IV fluids. -Continue to monitor  electrolytes and renal function. -A.m. labs.  Loose stools: -Supportive care. -Continue hydration.    Left-sided pleural effusion: -Moderate size seen on CT imaging.  -CT scan tends to exaggerate effusions. -Suspect this is secondary to recent cardiothoracic surgery. -Leukocytosis noted, but this seems to be old.   Left perihilar soft tissue:  Seen on CT imaging with narrowing of the lower lobe bronchus.  Follow-up CT chest with contrast recommended in the future, deferred at this time due to AKI. -Start incentive spirometry.  CAD s/p CABG 10/02/2019: Stable, denies any chest pain. -Continue aspirin 81 mg daily and atorvastatin -Holding Lopressor given soft blood pressures  Postoperative paroxysmal atrial fibrillation: Currently stable, not on anticoagulation. -Continue amiodarone  Type 2 diabetes: -Continue to monitor blood sugar and optimize. -Continue sliding scale insulin protocol.   Hypertension: Holding home amlodipine, Lopressor, and lisinopril given soft blood pressure.  Hyperlipidemia: Continue atorvastatin.  Hypothyroidism: TSH 1.700 on 09/11/2019.  Continue Synthroid.  Generalized weakness/deconditioning: Continue IV fluid hydration Consult physical therapy.  Hyponatremia: -Check urine sodium -Check TSH and cortisol -Check serum and urine osmolality. -Suspect secondary to prerenal state.  Hypokalemia: -Likely multifactorial. -Monitor and replete.  DVT prophylaxis: Subcu heparin Code Status: Full code Family Communication:  Disposition Plan: Will depend on physical therapy evaluation   Consultants:   None  Procedures:   Recent CABG  Antimicrobials:   None   Subjective: Weakness and fatigue improving. No fever or chills. No shortness of breath.  Objective: Vitals:   10/17/19 2241 10/18/19 0035 10/18/19 0517 10/18/19 0943  BP: (!) 142/65  124/65 117/69  Pulse: (!) 56 85 80 74  Resp: 16  17   Temp: 98.1 F (36.7 C)   97.7 F (36.5 C)   TempSrc: Oral  Oral   SpO2:   98%     Intake/Output Summary (Last 24 hours) at 10/18/2019 1010 Last data filed at 10/18/2019 0400 Gross per 24 hour  Intake 610.48 ml  Output --  Net 610.48 ml   There were no vitals filed for this visit.  Examination:  General exam: Appears calm and comfortable.  Patient is not in any obvious distress. Respiratory system: Clear to auscultation. Respiratory effort normal. Cardiovascular system: S1 & S2 heard. No pedal edema. Gastrointestinal system: Abdomen is nondistended, soft and nontender. No organomegaly or masses felt. Normal bowel sounds heard. Central nervous system: Alert and oriented. No focal neurological deficits.  Patient moves all extremities Extremities: No leg edema.  Data Reviewed: I have personally reviewed following labs and imaging studies  CBC: Recent Labs  Lab 10/16/19 1621 10/17/19 1151 10/17/19 1400 10/18/19 0218  WBC 20.0* 16.9*  --  15.0*  NEUTROABS 15.6* 12.5*  --   --   HGB 13.1 13.4 12.6 12.2  HCT 38.5 40.2 37.0 36.3  MCV 89 88.4  --  89.0  PLT 558* 578*  --  768*   Basic Metabolic Panel: Recent Labs  Lab 10/16/19 1621 10/17/19 1151 10/17/19 1400 10/18/19 0218  NA 128* 131* 129* 133*  K 4.4 3.8 3.6 3.3*  CL 87* 92*  --  97*  CO2 21 25  --  24  GLUCOSE 217* 195*  --  151*  BUN 25 22  --  21  CREATININE 1.42* 1.48*  --  1.33*  CALCIUM 8.8 9.0  --  8.4*   GFR: Estimated Creatinine Clearance: 36.8 mL/min (A) (by C-G formula based on SCr of 1.33 mg/dL (H)). Liver Function Tests: Recent Labs  Lab 10/17/19 1151  AST 17  ALT 11  ALKPHOS 81  BILITOT 1.5*  PROT 6.3*  ALBUMIN 3.2*   Recent Labs  Lab 10/17/19 1151  LIPASE 20   No results for input(s): AMMONIA in the last 168 hours. Coagulation Profile: Recent Labs  Lab 10/17/19 1151  INR 1.0   Cardiac Enzymes: No results for input(s): CKTOTAL, CKMB, CKMBINDEX, TROPONINI in the last 168 hours. BNP (last 3 results) No  results for input(s): PROBNP in the last 8760 hours. HbA1C: No results for input(s): HGBA1C in the last 72 hours. CBG: Recent Labs  Lab 10/17/19 2159 10/18/19 0743  GLUCAP 133* 164*   Lipid Profile: No results for input(s): CHOL, HDL, LDLCALC, TRIG, CHOLHDL, LDLDIRECT in the last 72 hours. Thyroid Function Tests: No results for input(s): TSH, T4TOTAL, FREET4, T3FREE, THYROIDAB in the last 72 hours. Anemia Panel: No results for input(s): VITAMINB12, FOLATE, FERRITIN, TIBC, IRON, RETICCTPCT in the last 72 hours. Urine analysis:    Component Value Date/Time   COLORURINE YELLOW 10/02/2019 [place] 10/02/2019 0434   LABSPEC 1.010 10/02/2019 0434   PHURINE 5.0 10/02/2019 0434   GLUCOSEU NEGATIVE 10/02/2019 0434   HGBUR NEGATIVE 10/02/2019 0434   BILIRUBINUR NEGATIVE 10/02/2019 0434   KETONESUR 20 (A) 10/02/2019 0434   PROTEINUR NEGATIVE 10/02/2019 0434   NITRITE NEGATIVE 10/02/2019 0434   LEUKOCYTESUR SMALL (A) 10/02/2019 0434   Sepsis Labs: @LABRCNTIP (procalcitonin:4,lacticidven:4)  ) Recent Results (from the past 240 hour(s))  SARS Coronavirus 2 by RT PCR (hospital order, performed in [facility] hospital lab) Nasopharyngeal Nasopharyngeal Swab     Status: None   Collection Time: 10/17/19  6:53 PM  Specimen: Nasopharyngeal Swab  Result Value Ref Range Status   SARS Coronavirus 2 NEGATIVE NEGATIVE Final    Comment: (NOTE) SARS-CoV-2 target nucleic acids are NOT DETECTED.  The SARS-CoV-2 RNA is generally detectable in upper and lower respiratory specimens during the acute phase of infection. The lowest concentration of SARS-CoV-2 viral copies this assay can detect is 250 copies / mL. A negative result does not preclude SARS-CoV-2 infection and should not be used as the sole basis for treatment or other patient management decisions.  A negative result may occur with improper specimen collection / handling, submission of specimen other than nasopharyngeal  swab, presence of viral mutation(s) within the areas targeted by this assay, and inadequate number of viral copies (<250 copies / mL). A negative result must be combined with clinical observations, patient history, and epidemiological information.  Fact Sheet for Patients:   StrictlyIdeas.no  Fact Sheet for Healthcare Providers: BankingDealers.co.za  This test is not yet approved or  cleared by the [place] FDA and has been authorized for detection and/or diagnosis of SARS-CoV-2 by FDA under an Emergency Use Authorization (EUA).  This EUA will remain in effect (meaning this test can be used) for the duration of the COVID-19 declaration under Section 564(b)(1) of the Act, 21 U.S.C. section 360bbb-3(b)(1), unless the authorization is terminated or revoked sooner.  Performed at [facility], [place] [address] [place], [place] [address]          Radiology Studies: CT Chest Wo Contrast  Result Date: 10/17/2019 CLINICAL DATA:  Shortness of breath.  Abnormal chest x-ray. EXAM: CT CHEST WITHOUT CONTRAST TECHNIQUE: Multidetector CT imaging of the chest was performed following the standard protocol without IV contrast. COMPARISON:  CT heart 09/24/2019. FINDINGS: Cardiovascular: Heart size normal. Extensive coronary artery calcifications are again seen. No significant pericardial effusion present. Atherosclerotic changes are present at the aortic arch and great vessel origins without aneurysm or stenosis. Pulmonary arteries are within normal limits. Mediastinum/Nodes: Left perihilar soft tissue is new since the prior exam. There is narrowing of the lower lobe bronchus other significant adenopathy is present. Lungs/Pleura: Moderate size left pleural effusion present. Mild dependent atelectasis present left. The right lung is clear. Upper Abdomen: Cholecystectomy is noted. No solid organ lesions are present. Vascular calcifications are evident.  Musculoskeletal: Vertebral body heights and alignment are normal. Median sternotomy is noted. Ribs are within normal limits. IMPRESSION: 1. Moderate size left pleural effusion and mild dependent atelectasis. 2. New left perihilar soft tissue with narrowing of the lower lobe bronchus. While this may represent atelectasis, follow-up CT of the chest with contrast is recommended for further evaluation. 3. Coronary artery disease. 4. Cholecystectomy. 5. Aortic Atherosclerosis (ICD10-I70.0). Electronically Signed   By: San Morelle M.D.   On: 10/17/2019 17:47   DG Chest Port 1 View  Result Date: 10/17/2019 CLINICAL DATA:  Shortness of breath and weakness. EXAM: PORTABLE CHEST 1 VIEW COMPARISON:  10/06/2019 FINDINGS: 1210 hours. Cardiopericardial silhouette is at upper limits of normal for size. Interval progression of left base collapse/consolidative opacity with small left pleural effusion. Right lung clear. Bones are diffusely demineralized. Telemetry leads overlie the chest. IMPRESSION: Interval increase in left base collapse/consolidation with small effusion. Electronically Signed   By: Misty Stanley M.D.   On: 10/17/2019 12:40        Scheduled Meds: . amiodarone  200 mg Oral Daily  . aspirin EC  81 mg Oral Daily  . atorvastatin  40 mg Oral QHS  . heparin  5,000 Units Subcutaneous Q8H  . insulin aspart  0-9 Units Subcutaneous TID WC  . levothyroxine  50 mcg Oral Q0600  . metoprolol tartrate      . metoprolol tartrate  50 mg Oral BID  . PARoxetine  10 mg Oral Daily   Continuous Infusions: . sodium chloride 75 mL/hr at 10/18/19 0957     LOS: 0 days    Time spent: 35 minutes.    Dana Allan, MD  [facility] Pager #: [phone] 7PM-7AM contact night coverage as above

## 2019-10-18 NOTE — Evaluation (Signed)
Physical Therapy Evaluation Patient Details Name: Courtney Gardner MRN: [id number] DOB: 1951/03/18 Today's Date: 10/18/2019   History of Present Illness  Pt is a 69 y/o female admitted secondary to fatigue. 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    Clinical Impression  Pt presented supine in bed with HOB elevated, awake and willing to participate in therapy session. Prior to admission, pt reported that she was ambulating with use of a RW intermittently and required assistance for ADLs from roommate. Pt lives with her roommate in a single level mobile home with a few steps to enter. At the time of evaluation, pt overall at a supervision to min guard level with all functional mobility with use of RW for hallway ambulation. Pt's HR stable in the 70's-80's. Pt asymptomatic throughout. PT will continue to f/u with pt acutely to progress mobility as tolerated per PT POC.     Follow Up Recommendations No PT follow up;Supervision/Assistance - 24 hour    Equipment Recommendations  None recommended by PT    Recommendations for Other Services       Precautions / Restrictions Precautions Precautions: Fall;Sternal Precaution Comments: recent CABG, verbal cueing for UE restrictions Restrictions Weight Bearing Restrictions: Yes Other Position/Activity Restrictions: recent CABG      Mobility  Bed Mobility Overal bed mobility: Needs Assistance Bed Mobility: Supine to Sit;Sit to Supine     Supine to sit: Min guard Sit to supine: Supervision   General bed mobility comments: pt able to adhere to sternal precautions throughout  Transfers Overall transfer level: Needs assistance Equipment used: Rolling walker (2 wheeled) Transfers: Sit to/from Stand Sit to Stand: Min guard         General transfer comment: cueing and emphasis on use of bilateral LEs to  stand   Ambulation/Gait Ambulation/Gait assistance: Min guard Gait Distance (Feet): 75 Feet Assistive device: Rolling walker (2 wheeled) Gait Pattern/deviations: Step-through pattern;Decreased stride length Gait velocity: decr   General Gait Details: pt overall steady with use of RW, no LOB or need for physical assistance, limited secondary to fatigue  Stairs            Wheelchair Mobility    Modified Rankin (Stroke Patients Only)       Balance Overall balance assessment: Needs assistance Sitting-balance support: Feet supported Sitting balance-Leahy Scale: Good     Standing balance support: Bilateral upper extremity supported Standing balance-Leahy Scale: Poor                               Pertinent Vitals/Pain Pain Assessment: No/denies pain    Home Living Family/patient expects to be discharged to:: Private residence Living Arrangements: Non-relatives/Friends Available Help at Discharge: Friend(s);Available 24 hours/day Type of Home: Mobile home Home Access: Stairs to enter Entrance Stairs-Rails: Right Entrance Stairs-Number of Steps: 3 Home Layout: One level Home Equipment: Clinical cytogeneticist - 2 wheels      Prior Function Level of Independence: Needs assistance   Gait / Transfers Assistance Needed: using RW PRN  ADL's / Homemaking Assistance Needed: needing assistance with bathing and dressing from roommate        Hand Dominance        Extremity/Trunk Assessment   Upper Extremity Assessment Upper Extremity Assessment: Overall WFL for tasks assessed    Lower Extremity Assessment Lower Extremity Assessment: Overall WFL for tasks assessed  Cervical / Trunk Assessment Cervical / Trunk Assessment: Other exceptions Cervical / Trunk Exceptions: sternal precautions  Communication   Communication: No difficulties  Cognition Arousal/Alertness: Awake/alert Behavior During Therapy: WFL for tasks assessed/performed Overall  Cognitive Status: Within Functional Limits for tasks assessed                                        General Comments      Exercises     Assessment/Plan    PT Assessment Patient needs continued PT services  PT Problem List Decreased strength;Decreased balance;Decreased activity tolerance;Decreased mobility;Decreased knowledge of use of DME;Decreased safety awareness;Decreased knowledge of precautions;Cardiopulmonary status limiting activity       PT Treatment Interventions DME instruction;Gait training;Functional mobility training;Stair training;Therapeutic activities;Therapeutic exercise;Balance training;Neuromuscular re-education;Patient/family education    PT Goals (Current goals can be found in the Care Plan section)  Acute Rehab PT Goals Patient Stated Goal: "go home" PT Goal Formulation: With patient Time For Goal Achievement: 11/01/19 Potential to Achieve Goals: Good    Frequency Min 3X/week   Barriers to discharge        Co-evaluation               AM-PAC PT "6 Clicks" Mobility  Outcome Measure Help needed turning from your back to your side while in a flat bed without using bedrails?: None Help needed moving from lying on your back to sitting on the side of a flat bed without using bedrails?: None Help needed moving to and from a bed to a chair (including a wheelchair)?: None Help needed standing up from a chair using your arms (e.g., wheelchair or bedside chair)?: None Help needed to walk in hospital room?: None Help needed climbing 3-5 steps with a railing? : A Little 6 Click Score: 23    End of Session Equipment Utilized During Treatment: Gait belt Activity Tolerance: Patient limited by fatigue Patient left: in bed;with call bell/phone within reach;with bed alarm set Nurse Communication: Mobility status PT Visit Diagnosis: Other abnormalities of gait and mobility (R26.89)    Time: 6568-1275 PT Time Calculation (min) (ACUTE ONLY):  13 min   Charges:   PT Evaluation $PT Eval Moderate Complexity: 1 Mod          Eduard Clos, PT, DPT  Acute Rehabilitation Services Pager [phone] Office [place] 10/18/2019, 11:46 AM

## 2019-10-19 ENCOUNTER — Encounter (HOSPITAL_COMMUNITY): Payer: Self-pay | Admitting: Internal Medicine

## 2019-10-19 LAB — CBC WITH DIFFERENTIAL/PLATELET
Abs Immature Granulocytes: 0.16 10*3/uL — ABNORMAL HIGH (ref 0.00–0.07)
Basophils Absolute: 0.3 10*3/uL — ABNORMAL HIGH (ref 0.0–0.1)
Basophils Relative: 2 %
Eosinophils Absolute: 1.2 10*3/uL — ABNORMAL HIGH (ref 0.0–0.5)
Eosinophils Relative: 9 %
HCT: 34.6 % — ABNORMAL LOW (ref 36.0–46.0)
Hemoglobin: 11.6 g/dL — ABNORMAL LOW (ref 12.0–15.0)
Immature Granulocytes: 1 %
Lymphocytes Relative: 19 %
Lymphs Abs: 2.5 10*3/uL (ref 0.7–4.0)
MCH: 30.2 pg (ref 26.0–34.0)
MCHC: 33.5 g/dL (ref 30.0–36.0)
MCV: 90.1 fL (ref 80.0–100.0)
Monocytes Absolute: 1.4 10*3/uL — ABNORMAL HIGH (ref 0.1–1.0)
Monocytes Relative: 11 %
Neutro Abs: 7.5 10*3/uL (ref 1.7–7.7)
Neutrophils Relative %: 58 %
Platelets: 533 10*3/uL — ABNORMAL HIGH (ref 150–400)
RBC: 3.84 MIL/uL — ABNORMAL LOW (ref 3.87–5.11)
RDW: 13.5 % (ref 11.5–15.5)
WBC: 13 10*3/uL — ABNORMAL HIGH (ref 4.0–10.5)
nRBC: 0 % (ref 0.0–0.2)

## 2019-10-19 LAB — RENAL FUNCTION PANEL
Albumin: 2.6 g/dL — ABNORMAL LOW (ref 3.5–5.0)
Anion gap: 9 (ref 5–15)
BUN: 18 mg/dL (ref 8–23)
CO2: 22 mmol/L (ref 22–32)
Calcium: 8.5 mg/dL — ABNORMAL LOW (ref 8.9–10.3)
Chloride: 105 mmol/L (ref 98–111)
Creatinine, Ser: 1.19 mg/dL — ABNORMAL HIGH (ref 0.44–1.00)
GFR calc Af Amer: 54 mL/min — ABNORMAL LOW (ref >60)
GFR calc non Af Amer: 46 mL/min — ABNORMAL LOW (ref >60)
Glucose, Bld: 104 mg/dL — ABNORMAL HIGH (ref 70–99)
Phosphorus: 2.7 mg/dL (ref 2.5–4.6)
Potassium: 3.7 mmol/L (ref 3.5–5.1)
Sodium: 136 mmol/L (ref 135–145)

## 2019-10-19 LAB — GLUCOSE, CAPILLARY
Glucose-Capillary: 110 mg/dL — ABNORMAL HIGH (ref 70–99)
Glucose-Capillary: 116 mg/dL — ABNORMAL HIGH (ref 70–99)

## 2019-10-19 LAB — MAGNESIUM: Magnesium: 1.5 mg/dL — ABNORMAL LOW (ref 1.7–2.4)

## 2019-10-19 MED ORDER — MAGNESIUM SULFATE 2 GM/50ML IV SOLN
2.0000 g | Freq: Once | INTRAVENOUS | Status: AC
  Administered 2019-10-19: 2 g via INTRAVENOUS
  Filled 2019-10-19: qty 50

## 2019-10-19 NOTE — Progress Notes (Signed)
Pt IVs removed, catheters intact and telemetry removed, CCMD aware  Pt discharge education provided at bedside  Pt has all belongings  Pt discharged via wheelchair with nursing staff

## 2019-10-19 NOTE — Plan of Care (Signed)
  Problem: Education: Goal: Knowledge of General Education information will improve Description Including pain rating scale, medication(s)/side effects and non-pharmacologic comfort measures Outcome: Progressing   

## 2019-10-19 NOTE — Discharge Summary (Signed)
Physician Discharge Summary  Patient ID: Courtney Gardner MRN: [id number] DOB/AGE: [birth year] 69 y.o.  Admit date: 10/17/2019 Discharge date: 10/19/2019  Admission Diagnoses:  Discharge Diagnoses:  Principal Problem:   AKI (acute kidney injury) ([place])   Hyponatremia   Hypokalemia   Hypomagnesemia.   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  Patient was seen at the primary care provider's office where lab work done revealed significant electrolyte abnormalities.  Patient was also feeling very weak and fatigued.  On presentation to the hospital, sodium was noted to be 128, with serum creatinine of 1.42.  Prior serum creatinine level was 1.04-1.21.  PCPs office advised the patient to come to the hospital for further assessment and management.  On further questioning, patient endorsed significant diarrhea following the surgery.  Patient was also on diuretics for 5 days following surgery.  Acute kidney injury: -Likely secondary to volume depletion.   -According to the patient, patient significant diarrhea prior to presentation.  -Diarrhea has resolved significantly.  -Patient was admitted and volume resuscitated during the hospital stay.    Loose stools: -Supportive care. -Continue hydration.   -Loose stool/diarrhea has resolved.  Left-sided pleural effusion: -Moderate size seen on CT imaging.  -Suspect this is secondary to recent  cardiothoracic surgery. -Leukocytosis noted, but this seems to be improving without antibiotics.    Left perihilar soft tissue: Seen on CT imaging with narrowing of the lower lobe bronchus. Follow-up CT chest with contrast recommended in the future, deferred at this time due to AKI. -Start incentive spirometry.  CAD s/p CABG 10/02/2019: Stable, denies any chest pain. -Continue aspirin 81 mg daily and atorvastatin -Holding Lopressor given soft blood pressures  Postoperative paroxysmal atrial fibrillation: Currently stable, not on anticoagulation. -Continue amiodarone  Type 2 diabetes: -Continue to monitor blood sugar and optimize. -Continue sliding scale insulin protocol.   Hypertension: Holding home amlodipine, Lopressor, and lisinopril given soft blood pressure.  Hyperlipidemia: Continue atorvastatin.  Hypothyroidism: TSH 1.700 on 09/11/2019.Continue Synthroid.  Generalized weakness/deconditioning: Continue IV fluid hydration Consult physical therapy.  Hyponatremia: -This is likely secondary to volume depletion. -Urine sodium was 15 (after patient has received significant amount of IV fluids). -TSH and cortisol level within normal limits.    Hypokalemia: -Likely multifactorial. -Potassium was monitored and repleted during the hospital stay. -Potassium prior to discharge was 3.7.  Hypomagnesemia: -Magnesium done today, October 19, 2019 was 1.5. -Patient will be given IV magnesium 2 g x 1 dose prior to discharge. -Primary care provider will kindly continue to monitor renal function and electrolytes.    Consults: None  Significant Diagnostic Studies:  labs:  Sodium was 128 on presentation.  With hydration, sodium is up to 136. Serum creatinine peaked at 1.42 during the hospital stay. Serum creatinine prior to discharge was 1.19. Magnesium prior to discharge was 1.5.  Patient be given 2 g of magnesium IV prior to discharge. -Significant leukocytosis on  presentation (see was 20 on presentation, and down to 13 prior to discharge.  Procalcitonin was less than 0.1)  Imaging studies: -CT chest revealed moderate left-sided pleural effusion.  Patient underwent cardiothoracic surgery about 2 weeks prior to presentation.  Patient follow-up with cardiothoracic team on discharge.  Discharge Exam: Blood pressure 120/66, pulse (!) 56, temperature 97.7 F (36.5 C), temperature source Oral, resp. rate 16, height 5\' 6"  (1.676 m), weight 68.3 kg, SpO2 96 %.  Disposition: Discharge disposition: 01-Home or Self Care  Discharge Instructions    Diet - low sodium heart healthy   Complete by: As directed    Consistent carbohydrate diet.   Discharge instructions   Complete by: As directed    Please check your blood pressure 3-4 times daily and call your primary care provider for systolic blood pressure greater than 263 mmHg or diastolic blood pressure less than 70 mmHg.  Norvasc (amlodipine) is currently on hold.  Liberal oral fluid.   Increase activity slowly   Complete by: As directed    No wound care   Complete by: As directed      Allergies as of 10/19/2019   No Known Allergies     Medication List    STOP taking these medications   amLODipine 10 MG tablet Commonly known as: NORVASC   loperamide 2 MG tablet Commonly known as: IMODIUM A-D     TAKE these medications   acetaminophen 500 MG tablet Commonly known as: TYLENOL Take 2 tablets (1,000 mg total) by mouth every 6 (six) hours as needed.   ALPRAZolam 0.25 MG tablet Commonly known as: XANAX Take 1 tablet (0.25 mg total) by mouth at bedtime as needed for anxiety.   amiodarone 200 MG tablet Commonly known as: PACERONE Take 1 tablet (200 mg total) by mouth daily.   aspirin EC 81 MG tablet Take 1 tablet (81 mg total) by mouth daily.   atorvastatin 40 MG tablet Commonly known as: LIPITOR Take 40 mg by mouth at bedtime.   levothyroxine 50 MCG tablet Commonly known as:  SYNTHROID Take 50 mcg by mouth daily before breakfast.   lisinopril 20 MG tablet Commonly known as: ZESTRIL Take 20 mg by mouth daily.   metFORMIN 500 MG tablet Commonly known as: GLUCOPHAGE Take 500 mg by mouth at bedtime.   metoprolol tartrate 50 MG tablet Commonly known as: LOPRESSOR Take 1 tablet (50 mg total) by mouth 2 (two) times daily.   nitroGLYCERIN 0.4 MG SL tablet Commonly known as: NITROSTAT Place 1 tablet (0.4 mg total) under the tongue every 5 (five) minutes as needed for chest pain.   omeprazole 40 MG capsule Commonly known as: PRILOSEC Take 40 mg by mouth daily.   ondansetron 4 MG tablet Commonly known as: ZOFRAN Take 1 tablet (4 mg total) by mouth every 8 (eight) hours as needed for nausea or vomiting.   PARoxetine 10 MG tablet Commonly known as: PAXIL Take 10 mg by mouth daily.        SignedBonnell Public 10/19/2019, 11:35 AM

## 2019-10-23 ENCOUNTER — Other Ambulatory Visit: Payer: Self-pay

## 2019-10-23 ENCOUNTER — Encounter: Payer: Self-pay | Admitting: Thoracic Surgery (Cardiothoracic Vascular Surgery)

## 2019-10-23 ENCOUNTER — Ambulatory Visit (INDEPENDENT_AMBULATORY_CARE_PROVIDER_SITE_OTHER): Payer: Self-pay | Admitting: Thoracic Surgery (Cardiothoracic Vascular Surgery)

## 2019-10-23 VITALS — BP 109/67 | HR 55 | Temp 97.3°F | Resp 20 | Ht 66.0 in | Wt 146.0 lb

## 2019-10-23 DIAGNOSIS — Z951 Presence of aortocoronary bypass graft: Secondary | ICD-10-CM

## 2019-10-23 NOTE — Progress Notes (Signed)
      [name]       [name]             [phone]        Courtney Gardner [facility] Medical Record #[id number] Date of Birth: 1951/04/12  Referring: Jenean Lindau, MD Primary Care: Norwood Levo, MD Primary Cardiologist:Rajan Reuel Derby, MD  Reason for visit:   follow-up  History of Present Illness:     Ms. Courtney Gardner presents for her  first follow-up appointment. Since discharge she has had problems with diarrhea. She presented to the ED on June 20 and was given IV fluids and felt better. She continues to have decent urination. But she is pretty fatigued today simply by coming to this appointment.  Physical Exam: BP 109/67 (BP Location: Left Arm, Patient Position: Sitting, Cuff Size: Normal)   Pulse (!) 55   Temp (!) 97.3 F (36.3 C) (Temporal)   Resp 20   Ht 5\' 6"  (1.676 m)   Wt 146 lb (66.2 kg)   SpO2 97% Comment: RA  BMI 23.57 kg/m   Alert NAD Incision clean.  Sternum stable Abdomen soft, ND Trace peripheral edema       Assessment / Plan:   Status post CABG, with postoperative day mediastinal reexploration for bleeding. She has had some diarrhea and I recommended that she try banana chips and oatmeal. She will come back to clinic in 1 month for follow-up appointment   [name] 10/23/2019 1:55 PM

## 2019-10-24 ENCOUNTER — Ambulatory Visit: Payer: Medicare Other | Admitting: Thoracic Surgery (Cardiothoracic Vascular Surgery)

## 2019-11-04 NOTE — Progress Notes (Deleted)
Cardiology Clinic Note   Patient Name: Courtney Gardner Date of Encounter: 11/04/2019  Primary Care Provider:  Norwood Levo, MD Primary Cardiologist:  Jenean Lindau, MD  Patient Profile    Courtney Gardner 69 year old female presents to the clinic today for follow-up evaluation of her hypertension.  Past Medical History    Past Medical History:  Diagnosis Date  . Angina pectoris ([place]) 11/20/2017  . Coronary artery disease involving native coronary artery of native heart 12/26/2017  . Diabetes mellitus without complication ([place])   . Essential hypertension 11/20/2017  . Ex-smoker 11/20/2017  . Hyperlipidemia 11/20/2017  . Iliotibial band syndrome of left side 09/09/2015   Past Surgical History:  Procedure Laterality Date  . CHOLECYSTECTOMY    . CORONARY ARTERY BYPASS GRAFT N/A 10/02/2019   Procedure: CORONARY ARTERY BYPASS GRAFTING (CABG) TIMES TWO, ON PUMP, USING LEFT INTERNAL MAMMARY ARTERY AND RIGHT GREATER SAPHENOUS VEIN HARVESTED ENDOSCOPICALLY;  Surgeon: [name], MD;  Location: [facility];  Service: Open Heart Surgery;  Laterality: N/A;  LIMA to LAD, SVG to OM 1  . INTRAVASCULAR PRESSURE WIRE/FFR STUDY N/A 11/23/2017   Procedure: INTRAVASCULAR PRESSURE WIRE/FFR STUDY;  Surgeon: Jettie Booze, MD;  Location: [facility];  Service: Cardiovascular;  Laterality: N/A;  . LEFT HEART CATH AND CORONARY ANGIOGRAPHY N/A 11/23/2017   Procedure: LEFT HEART CATH AND CORONARY ANGIOGRAPHY;  Surgeon: Jettie Booze, MD;  Location: [facility];  Service: Cardiovascular;  Laterality: N/A;  . LEFT HEART CATH AND CORONARY ANGIOGRAPHY N/A 09/30/2019   Procedure: LEFT HEART CATH AND CORONARY ANGIOGRAPHY;  Surgeon: Troy Sine, MD;  Location: [facility];  Service: Cardiovascular;  Laterality: N/A;  . MEDIASTINAL EXPLORATION N/A 10/03/2019   Procedure: MEDIASTINAL WASHOUT;  Surgeon: [name], MD;  Location: [facility];  Service: Thoracic;  Laterality:  N/A;  . TEE WITHOUT CARDIOVERSION N/A 10/02/2019   Procedure: TRANSESOPHAGEAL ECHOCARDIOGRAM (TEE);  Surgeon: [name], MD;  Location: [facility];  Service: Open Heart Surgery;  Laterality: N/A;  . TUBAL LIGATION      Allergies  No Known Allergies  History of Present Illness    Courtney Gardner has a PMH of CAD status post CABG (10/02/2019  Home Medications    Prior to Admission medications   Medication Sig Start Date End Date Taking? Authorizing Provider  acetaminophen (TYLENOL) 500 MG tablet Take 2 tablets (1,000 mg total) by mouth every 6 (six) hours as needed. 10/07/19   Barrett, Erin R, PA-C  ALPRAZolam (XANAX) 0.25 MG tablet Take 1 tablet (0.25 mg total) by mouth at bedtime as needed for anxiety. 09/25/19   Revankar, Reita Cliche, MD  amiodarone (PACERONE) 200 MG tablet Take 1 tablet (200 mg total) by mouth daily. 10/08/19   Nani Skillern, PA-C  aspirin EC 81 MG tablet Take 1 tablet (81 mg total) by mouth daily. 09/25/19   Revankar, Reita Cliche, MD  atorvastatin (LIPITOR) 40 MG tablet Take 40 mg by mouth at bedtime.     [provider]  levothyroxine (SYNTHROID, LEVOTHROID) 50 MCG tablet Take 50 mcg by mouth daily before breakfast.  11/12/17   [provider]  lisinopril (ZESTRIL) 20 MG tablet Take 20 mg by mouth daily. 09/19/19   [provider]  metFORMIN (GLUCOPHAGE) 500 MG tablet Take 500 mg by mouth at bedtime.  07/21/19   [provider]  metoprolol tartrate (LOPRESSOR) 50 MG tablet Take 1 tablet (50 mg total) by mouth  2 (two) times daily. 10/08/19   Nani Skillern, PA-C  nitroGLYCERIN (NITROSTAT) 0.4 MG SL tablet Place 1 tablet (0.4 mg total) under the tongue every 5 (five) minutes as needed for chest pain. 08/12/19 11/10/19  Revankar, Reita Cliche, MD  omeprazole (PRILOSEC) 40 MG capsule Take 40 mg by mouth daily. 06/29/19   [provider]  ondansetron (ZOFRAN) 4 MG tablet Take 1 tablet (4 mg total) by mouth every 8 (eight) hours as needed for  nausea or vomiting. 10/13/19   Elgie Collard, PA-C  PARoxetine (PAXIL) 10 MG tablet Take 10 mg by mouth daily. 06/29/19   [provider]    Family History    Family History  Problem Relation Age of Onset  . Leukemia Mother   . Lung cancer Father   . Hypertension Sister   . Heart attack Maternal Grandmother   . Heart attack Maternal Grandfather   . Heart attack Maternal Uncle    She indicated that the status of her mother is unknown. She indicated that the status of her father is unknown. She indicated that the status of her sister is unknown. She indicated that the status of her maternal grandmother is unknown. She indicated that the status of her maternal grandfather is unknown. She indicated that the status of her maternal uncle is unknown.  Social History    Social History   Socioeconomic History  . Marital status: Unknown    Spouse name: Not on file  . Number of children: Not on file  . Years of education: Not on file  . Highest education level: Not on file  Occupational History  . Not on file  Tobacco Use  . Smoking status: Former Research scientist (life sciences)  . Smokeless tobacco: Never Used  Vaping Use  . Vaping Use: Never used  Substance and Sexual Activity  . Alcohol use: Not Currently  . Drug use: Never  . Sexual activity: Not on file  Other Topics Concern  . Not on file  Social History Narrative  . Not on file   Social Determinants of Health   Financial Resource Strain:   . Difficulty of Paying Living Expenses:   Food Insecurity:   . Worried About Charity fundraiser in the Last Year:   . Arboriculturist in the Last Year:   Transportation Needs:   . Film/video editor (Medical):   Marland Kitchen Lack of Transportation (Non-Medical):   Physical Activity:   . Days of Exercise per Week:   . Minutes of Exercise per Session:   Stress:   . Feeling of Stress :   Social Connections:   . Frequency of Communication with Friends and Family:   . Frequency of Social Gatherings with  Friends and Family:   . Attends Religious Services:   . Active Member of Clubs or Organizations:   . Attends Archivist Meetings:   Marland Kitchen Marital Status:   Intimate Partner Violence:   . Fear of Current or Ex-Partner:   . Emotionally Abused:   Marland Kitchen Physically Abused:   . Sexually Abused:      Review of Systems    General:  No chills, fever, night sweats or weight changes.  Cardiovascular:  No chest pain, dyspnea on exertion, edema, orthopnea, palpitations, paroxysmal nocturnal dyspnea. Dermatological: No rash, lesions/masses Respiratory: No cough, dyspnea Urologic: No hematuria, dysuria Abdominal:   No nausea, vomiting, diarrhea, bright red blood per rectum, melena, or hematemesis Neurologic:  No visual changes, wkns, changes in mental  status. All other systems reviewed and are otherwise negative except as noted above.  Physical Exam    VS:  There were no vitals taken for this visit. , BMI There is no height or weight on file to calculate BMI. GEN: Well nourished, well developed, in no acute distress. HEENT: normal. Neck: Supple, no JVD, carotid bruits, or masses. Cardiac: RRR, no murmurs, rubs, or gallops. No clubbing, cyanosis, edema.  Radials/DP/PT 2+ and equal bilaterally.  Respiratory:  Respirations regular and unlabored, clear to auscultation bilaterally. GI: Soft, nontender, nondistended, BS + x 4. MS: no deformity or atrophy. Skin: warm and dry, no rash. Neuro:  Strength and sensation are intact. Psych: Normal affect.  Accessory Clinical Findings    ECG personally reviewed by me today- *** - No acute changes  Assessment & Plan   1.  Essential hypertension-  Jossie Ng. Amonte Brookover NP-C    11/04/2019, 1:28 PM [facility] [place] [address] Office [phone] Fax [phone]

## 2019-11-05 ENCOUNTER — Telehealth: Payer: Self-pay

## 2019-11-05 ENCOUNTER — Ambulatory Visit: Payer: Medicare Other | Admitting: General Practice

## 2019-11-05 NOTE — Telephone Encounter (Signed)
Discussed w/Jesse cleaver, FNP. Pt has appt w/Dr Revankar tomorrow. Pt has had CABG and since her BP has been well controlled. Call and discuss BP at home. No need for appt today.  Called pt and discussed BP and BP at home she states that BP is well controlled at home also. We will cancel appt for today and she will discuss this with Dr Geraldo Pitter to see if [facility] clinic is still needed. She will CB if new appt is needed

## 2019-11-06 ENCOUNTER — Ambulatory Visit: Payer: Medicare Other | Admitting: Cardiology

## 2019-11-10 ENCOUNTER — Ambulatory Visit: Payer: Medicare Other | Admitting: Cardiology

## 2019-11-27 ENCOUNTER — Other Ambulatory Visit: Payer: Self-pay | Admitting: Thoracic Surgery (Cardiothoracic Vascular Surgery)

## 2019-11-27 DIAGNOSIS — I2510 Atherosclerotic heart disease of native coronary artery without angina pectoris: Secondary | ICD-10-CM

## 2019-11-28 ENCOUNTER — Ambulatory Visit (INDEPENDENT_AMBULATORY_CARE_PROVIDER_SITE_OTHER): Payer: Self-pay | Admitting: Thoracic Surgery (Cardiothoracic Vascular Surgery)

## 2019-11-28 ENCOUNTER — Other Ambulatory Visit: Payer: Self-pay

## 2019-11-28 ENCOUNTER — Ambulatory Visit
Admission: RE | Admit: 2019-11-28 | Discharge: 2019-11-28 | Disposition: A | Discharge status: Home / Self Care | Payer: Medicare Other | Source: Ambulatory Visit | Attending: Thoracic Surgery (Cardiothoracic Vascular Surgery) | Admitting: Thoracic Surgery (Cardiothoracic Vascular Surgery)

## 2019-11-28 ENCOUNTER — Encounter: Payer: Self-pay | Admitting: Thoracic Surgery (Cardiothoracic Vascular Surgery)

## 2019-11-28 ENCOUNTER — Other Ambulatory Visit: Payer: Self-pay | Admitting: *Deleted

## 2019-11-28 VITALS — BP 145/80 | HR 60 | Temp 97.9°F | Resp 20 | Ht 66.0 in | Wt 145.0 lb

## 2019-11-28 DIAGNOSIS — J90 Pleural effusion, not elsewhere classified: Secondary | ICD-10-CM

## 2019-11-28 DIAGNOSIS — Z951 Presence of aortocoronary bypass graft: Secondary | ICD-10-CM

## 2019-11-28 DIAGNOSIS — I2510 Atherosclerotic heart disease of native coronary artery without angina pectoris: Secondary | ICD-10-CM

## 2019-11-28 NOTE — Progress Notes (Signed)
      [address]       [place] [address]             [phone]        Shameeka L [facility] Medical Record #[id number] Date of Birth: 1950/08/27  Referring: Jenean Lindau, MD Primary Care: Norwood Levo, MD Primary Cardiologist:Rajan Reuel Derby, MD  Reason for visit:   follow-up  History of Present Illness:     Courtney Gardner comes in for her 1 month follow-up appointment.  She states that she is doing quite well.  She denies any dyspnea or shortness of breath.  The scab has fallen off of her incision.  She denies any pain or drainage.  Physical Exam: BP (!) 145/80   Pulse 60   Temp 97.9 F (36.6 C) (Skin)   Resp 20   Ht 5\' 6"  (1.676 m)   Wt 145 lb (65.8 kg)   SpO2 96% Comment: RA  BMI 23.40 kg/m   Alert NAD Incision there is a 1 x 1 cm a wound in the middle of her incision.  This fibrinous exudate at the base.  There is no erythema or drainage.  Sternum stable Abdomen soft, ND No peripheral edema   Diagnostic Studies & Laboratory data: CXR: Loculated left pleural effusion.     Assessment / Plan:   69 year old female status post CABG with mediastinal washout, now with a small superficial defect in her sternum incision.  I have instructed her on wet-to-dry dressing changes twice daily.  In regards to her pleural effusion I have ordered an ultrasound-guided thoracentesis.  I will see her back in 2 weeks for wound check.   [name] 11/28/2019 5:11 PM

## 2019-12-01 ENCOUNTER — Ambulatory Visit (HOSPITAL_COMMUNITY)
Admission: RE | Admit: 2019-12-01 | Discharge: 2019-12-01 | Disposition: A | Discharge status: Home / Self Care | Payer: Medicare Other | Source: Ambulatory Visit | Attending: Thoracic Surgery (Cardiothoracic Vascular Surgery) | Admitting: Thoracic Surgery (Cardiothoracic Vascular Surgery)

## 2019-12-01 DIAGNOSIS — Z20822 Contact with and (suspected) exposure to covid-19: Secondary | ICD-10-CM | POA: Diagnosis not present

## 2019-12-01 DIAGNOSIS — Z01812 Encounter for preprocedural laboratory examination: Secondary | ICD-10-CM | POA: Diagnosis not present

## 2019-12-01 LAB — SARS CORONAVIRUS 2 (TAT 6-24 HRS): SARS Coronavirus 2: NEGATIVE

## 2019-12-04 ENCOUNTER — Ambulatory Visit (HOSPITAL_COMMUNITY)
Admission: RE | Admit: 2019-12-04 | Discharge: 2019-12-04 | Disposition: A | Discharge status: Home / Self Care | Payer: Medicare Other | Source: Ambulatory Visit | Attending: Thoracic Surgery (Cardiothoracic Vascular Surgery) | Admitting: Thoracic Surgery (Cardiothoracic Vascular Surgery)

## 2019-12-04 ENCOUNTER — Other Ambulatory Visit: Payer: Self-pay

## 2019-12-04 ENCOUNTER — Ambulatory Visit (HOSPITAL_COMMUNITY)
Admission: RE | Admit: 2019-12-04 | Discharge: 2019-12-04 | Disposition: A | Discharge status: Home / Self Care | Payer: Medicare Other | Source: Ambulatory Visit | Attending: Physician Assistant | Admitting: Physician Assistant

## 2019-12-04 DIAGNOSIS — Z951 Presence of aortocoronary bypass graft: Secondary | ICD-10-CM

## 2019-12-04 DIAGNOSIS — J90 Pleural effusion, not elsewhere classified: Secondary | ICD-10-CM | POA: Diagnosis present

## 2019-12-04 HISTORY — PX: IR THORACENTESIS ASP PLEURAL SPACE W/IMG GUIDE: IMG5380

## 2019-12-04 MED ORDER — LIDOCAINE HCL 1 % IJ SOLN
INTRAMUSCULAR | Status: DC | PRN
  Administered 2019-12-04: 10 mL

## 2019-12-04 MED ORDER — LIDOCAINE HCL 1 % IJ SOLN
INTRAMUSCULAR | Status: AC
  Filled 2019-12-04: qty 20

## 2019-12-04 NOTE — Procedures (Signed)
PROCEDURE SUMMARY:  Successful US guided left thoracentesis. Yielded 1 L of clear amber fluid. Patient tolerated procedure well. No immediate complications. EBL = trace  Post procedure chest X-ray reveals no pneumothorax  Khairi Garman S Paxson Harrower PA-C 12/04/2019 10:59 AM

## 2019-12-05 ENCOUNTER — Ambulatory Visit: Payer: Medicare Other | Admitting: Cardiology

## 2019-12-09 ENCOUNTER — Encounter: Payer: Self-pay | Admitting: Thoracic Surgery (Cardiothoracic Vascular Surgery)

## 2019-12-09 ENCOUNTER — Other Ambulatory Visit: Payer: Self-pay

## 2019-12-09 ENCOUNTER — Ambulatory Visit (INDEPENDENT_AMBULATORY_CARE_PROVIDER_SITE_OTHER): Payer: Self-pay | Admitting: Thoracic Surgery (Cardiothoracic Vascular Surgery)

## 2019-12-09 VITALS — BP 150/72 | HR 60 | Temp 97.8°F | Resp 20 | Ht 66.0 in | Wt 143.0 lb

## 2019-12-09 DIAGNOSIS — I2510 Atherosclerotic heart disease of native coronary artery without angina pectoris: Secondary | ICD-10-CM

## 2019-12-09 DIAGNOSIS — J90 Pleural effusion, not elsewhere classified: Secondary | ICD-10-CM

## 2019-12-09 DIAGNOSIS — Z951 Presence of aortocoronary bypass graft: Secondary | ICD-10-CM

## 2019-12-09 MED ORDER — METOPROLOL TARTRATE 50 MG PO TABS: 50.0000 mg | ORAL_TABLET | Freq: Two times a day (BID) | ORAL | 1 refills | Status: DC

## 2019-12-09 MED ORDER — LISINOPRIL 20 MG PO TABS: 20.0000 mg | ORAL_TABLET | Freq: Every day | ORAL | 0 refills | Status: DC

## 2019-12-09 MED ORDER — ATORVASTATIN CALCIUM 40 MG PO TABS: 40.0000 mg | ORAL_TABLET | Freq: Every day | ORAL | 0 refills | Status: DC

## 2019-12-09 MED ORDER — OMEPRAZOLE 40 MG PO CPDR: 40.0000 mg | DELAYED_RELEASE_CAPSULE | Freq: Every day | ORAL | 0 refills | Status: DC

## 2019-12-09 MED ORDER — AMIODARONE HCL 200 MG PO TABS: 200.0000 mg | ORAL_TABLET | Freq: Every day | ORAL | 1 refills | Status: DC

## 2019-12-09 NOTE — Progress Notes (Signed)
      [address]       [place] [address]             [phone]        Courtney Gardner [facility] Medical Record #[id number] Date of Birth: [birth year]  Referring: Courtney Lindau, MD Primary Care: Courtney Levo, MD Primary Cardiologist:Courtney Reuel Derby, MD  Reason for visit:   follow-up  History of Present Illness:     Courtney Gardner comes in for a wound check. She also had a thoracentesis with removal of 1 L of fluid. Her respiratory status is much improved.  Physical Exam: BP (!) 150/72   Pulse 60   Temp 97.8 F (36.6 C) (Skin)   Resp 20   Ht 5\' 6"  (1.676 m)   Wt 143 lb (64.9 kg)   SpO2 97% Comment: RA  BMI 23.08 kg/m   Alert NAD Incision small area of granulation tissue less than 1 cm. It is filled in nicely. no drainage or erythema noted..  Sternum stable Abdomen soft, ND No peripheral edema       Assessment / Plan:   69 year old female status post coronary artery bypass grafting with reexploration. She underwent a thoracentesis on the left side with 1 L fluid removed with improvement in her respiratory status. Also superficial incision is healing nicely with a good area of granulation tissue there is less than 1 cm in diameter  I have given her some refills on her medications which includes the metoprolol,the amiodarone, the lisinopril, atorvastatin.  She will meet with her cardiologist later this month. She did have a question about the renal artery stenosis but or if defer that to outpatient follow-up for work-up for that.  In regards to the wound, it is healed nicely. she no longer needs to perform wet-to-dry dressing changes. I explained that she can cover this with a Band-Aid.  Follow-up as needed  Courtney Gardner 12/09/2019 12:26 PM

## 2019-12-12 ENCOUNTER — Other Ambulatory Visit: Payer: Self-pay

## 2019-12-12 ENCOUNTER — Encounter: Payer: Self-pay | Admitting: Cardiology

## 2019-12-12 ENCOUNTER — Ambulatory Visit (INDEPENDENT_AMBULATORY_CARE_PROVIDER_SITE_OTHER): Payer: Medicare Other | Admitting: Cardiology

## 2019-12-12 VITALS — BP 152/72 | HR 62 | Ht 66.0 in | Wt 146.0 lb

## 2019-12-12 DIAGNOSIS — I480 Paroxysmal atrial fibrillation: Secondary | ICD-10-CM | POA: Diagnosis not present

## 2019-12-12 DIAGNOSIS — Z87891 Personal history of nicotine dependence: Secondary | ICD-10-CM

## 2019-12-12 DIAGNOSIS — R0989 Other specified symptoms and signs involving the circulatory and respiratory systems: Secondary | ICD-10-CM

## 2019-12-12 DIAGNOSIS — I10 Essential (primary) hypertension: Secondary | ICD-10-CM

## 2019-12-12 DIAGNOSIS — E088 Diabetes mellitus due to underlying condition with unspecified complications: Secondary | ICD-10-CM

## 2019-12-12 DIAGNOSIS — E782 Mixed hyperlipidemia: Secondary | ICD-10-CM

## 2019-12-12 DIAGNOSIS — E1159 Type 2 diabetes mellitus with other circulatory complications: Secondary | ICD-10-CM

## 2019-12-12 DIAGNOSIS — E039 Hypothyroidism, unspecified: Secondary | ICD-10-CM

## 2019-12-12 DIAGNOSIS — I2510 Atherosclerotic heart disease of native coronary artery without angina pectoris: Secondary | ICD-10-CM | POA: Diagnosis not present

## 2019-12-12 DIAGNOSIS — I152 Hypertension secondary to endocrine disorders: Secondary | ICD-10-CM

## 2019-12-12 HISTORY — DX: Other specified symptoms and signs involving the circulatory and respiratory systems: R09.89

## 2019-12-12 NOTE — Progress Notes (Signed)
Cardiology Office Note:    Date:  12/12/2019   ID:  Courtney Gardner, DOB [birth year], MRN [id number]  PCP:  Norwood Levo, MD  Cardiologist:  Jenean Lindau, MD   Referring MD: Marguerita Merles, MD    ASSESSMENT:    1. Coronary artery disease involving native coronary artery of native heart without angina pectoris   2. Hypertension associated with diabetes ([facility])   3. Mixed hyperlipidemia   4. Paroxysmal atrial fibrillation (HCC)   5. Diabetes mellitus due to underlying condition with unspecified complications ([place])   6. Ex-smoker   7. Bruit   8. Acquired hypothyroidism    PLAN:    In order of problems listed above:  1. Coronary artery disease: Primary prevention stressed with the patient.  Importance of compliance with diet medication stressed and she vocalized understanding.  She is trying to get enrolled in the cardiac rehab program and I encouraged her to do this.  She is fasting today and requests all blood work and we will do this for her. 2. Paroxysmal atrial fibrillation: This happened in the perioperative..  Patient is on amiodarone therapy.  Because of the fact that she has had multiple issues after bypass including pleural effusion that she is not completely back to her normal self I would like to leave her on amiodarone for some more.  Of time.  Benefits and potential is explained and she vocalized understanding.  I would hate for her to go into atrial fibrillation again and this can be a significant setback in her health care and progress.  She understands. 3. Essential hypertension: Blood pressure stable 4. Mixed dyslipidemia and diabetes mellitus: Diet was emphasized.  Will have blood work done today. 5. Ex-smoker: Promises never to go back to smoking 6. Abdominal bruit: We will do ultrasound to evaluate renal artery stenosis. 7. Patient will be seen in follow-up appointment in 2 months or earlier if the patient has any concerns     Medication Adjustments/Labs and  Tests Ordered: Current medicines are reviewed at length with the patient today.  Concerns regarding medicines are outlined above.  No orders of the defined types were placed in this encounter.  No orders of the defined types were placed in this encounter.    No chief complaint on file.    History of Present Illness:    Courtney Gardner is a 69 y.o. female.  Patient has past medical history of coronary artery disease post CABG surgery, essential hypertension dyslipidemia and diabetes mellitus.  She underwent bypass surgery.  Subsequently there was pleural effusion and she was tapped with 1 L of fluid removed from her pleural space.  She is feeling very much better now.  She denies any chest pain orthopnea or PND.  At the time of my evaluation, the patient is alert awake oriented and in no distress.  Past Medical History:  Diagnosis Date  . Angina pectoris ([place]) 11/20/2017  . Coronary artery disease involving native coronary artery of native heart 12/26/2017  . Diabetes mellitus without complication ([place])   . Essential hypertension 11/20/2017  . Ex-smoker 11/20/2017  . Hyperlipidemia 11/20/2017  . Iliotibial band syndrome of left side 09/09/2015    Past Surgical History:  Procedure Laterality Date  . CHOLECYSTECTOMY    . CORONARY ARTERY BYPASS GRAFT N/A 10/02/2019   Procedure: CORONARY ARTERY BYPASS GRAFTING (CABG) TIMES TWO, ON PUMP, USING LEFT INTERNAL MAMMARY ARTERY AND RIGHT GREATER SAPHENOUS VEIN HARVESTED ENDOSCOPICALLY;  Surgeon: [name], MD;  Location: [facility] OR;  Service: Open Heart Surgery;  Laterality: N/A;  LIMA to LAD, SVG to OM 1  . INTRAVASCULAR PRESSURE WIRE/FFR STUDY N/A 11/23/2017   Procedure: INTRAVASCULAR PRESSURE WIRE/FFR STUDY;  Surgeon: Jettie Booze, MD;  Location: [facility];  Service: Cardiovascular;  Laterality: N/A;  . IR THORACENTESIS ASP PLEURAL SPACE W/IMG GUIDE  12/04/2019  . LEFT HEART CATH AND CORONARY ANGIOGRAPHY N/A 11/23/2017    Procedure: LEFT HEART CATH AND CORONARY ANGIOGRAPHY;  Surgeon: Jettie Booze, MD;  Location: [facility];  Service: Cardiovascular;  Laterality: N/A;  . LEFT HEART CATH AND CORONARY ANGIOGRAPHY N/A 09/30/2019   Procedure: LEFT HEART CATH AND CORONARY ANGIOGRAPHY;  Surgeon: Troy Sine, MD;  Location: [facility];  Service: Cardiovascular;  Laterality: N/A;  . MEDIASTINAL EXPLORATION N/A 10/03/2019   Procedure: MEDIASTINAL WASHOUT;  Surgeon: [name], MD;  Location: [facility];  Service: Thoracic;  Laterality: N/A;  . TEE WITHOUT CARDIOVERSION N/A 10/02/2019   Procedure: TRANSESOPHAGEAL ECHOCARDIOGRAM (TEE);  Surgeon: [name], MD;  Location: [facility];  Service: Open Heart Surgery;  Laterality: N/A;  . TUBAL LIGATION      Current Medications: Current Meds  Medication Sig  . acetaminophen (TYLENOL) 500 MG tablet Take 2 tablets (1,000 mg total) by mouth every 6 (six) hours as needed.  Marland Kitchen amiodarone (PACERONE) 200 MG tablet Take 1 tablet (200 mg total) by mouth daily.  Marland Kitchen aspirin EC 81 MG tablet Take 1 tablet (81 mg total) by mouth daily.  Marland Kitchen atorvastatin (LIPITOR) 40 MG tablet Take 1 tablet (40 mg total) by mouth at bedtime.  Marland Kitchen levothyroxine (SYNTHROID, LEVOTHROID) 50 MCG tablet Take 50 mcg by mouth daily before breakfast.   . lisinopril (ZESTRIL) 20 MG tablet Take 1 tablet (20 mg total) by mouth daily.  . metFORMIN (GLUCOPHAGE) 500 MG tablet Take 500 mg by mouth at bedtime.   . metoprolol tartrate (LOPRESSOR) 50 MG tablet Take 1 tablet (50 mg total) by mouth 2 (two) times daily.  Marland Kitchen omeprazole (PRILOSEC) 40 MG capsule Take 1 capsule (40 mg total) by mouth daily.  Marland Kitchen PARoxetine (PAXIL) 10 MG tablet Take 10 mg by mouth daily.     Allergies:   Patient has no known allergies.   Social History   Socioeconomic History  . Marital status: Unknown    Spouse name: Not on file  . Number of children: Not on file  . Years of education: Not on file  . Highest education  level: Not on file  Occupational History  . Not on file  Tobacco Use  . Smoking status: Former Research scientist (life sciences)  . Smokeless tobacco: Never Used  Vaping Use  . Vaping Use: Never used  Substance and Sexual Activity  . Alcohol use: Not Currently  . Drug use: Never  . Sexual activity: Not on file  Other Topics Concern  . Not on file  Social History Narrative  . Not on file   Social Determinants of Health   Financial Resource Strain:   . Difficulty of Paying Living Expenses:   Food Insecurity:   . Worried About Charity fundraiser in the Last Year:   . Arboriculturist in the Last Year:   Transportation Needs:   . Film/video editor (Medical):   Marland Kitchen Lack of Transportation (Non-Medical):   Physical Activity:   . Days of Exercise per Week:   . Minutes of Exercise per Session:   Stress:   .  Feeling of Stress :   Social Connections:   . Frequency of Communication with Friends and Family:   . Frequency of Social Gatherings with Friends and Family:   . Attends Religious Services:   . Active Member of Clubs or Organizations:   . Attends Archivist Meetings:   Marland Kitchen Marital Status:      Family History: The patient's family history includes Heart attack in her maternal grandfather, maternal grandmother, and maternal uncle; Hypertension in her sister; Leukemia in her mother; Lung cancer in her father.  ROS:   Please see the history of present illness.    All other systems reviewed and are negative.  EKGs/Labs/Other Studies Reviewed:    The following studies were reviewed today: I discussed my findings with the patient at length.  EKG reveals sinus rhythm nonspecific ST changes   Recent Labs: 10/17/2019: ALT 11; B Natriuretic Peptide 138.5 10/18/2019: TSH 3.685 10/19/2019: BUN 18; Creatinine, Ser 1.19; Hemoglobin 11.6; Magnesium 1.5; Platelets 533; Potassium 3.7; Sodium 136  Recent Lipid Panel    Component Value Date/Time   CHOL 138 09/11/2019 1705   TRIG 73 09/11/2019 1705     HDL 68 09/11/2019 1705   CHOLHDL 2.0 09/11/2019 1705   LDLCALC 56 09/11/2019 1705    Physical Exam:    VS:  BP (!) 152/72   Pulse 62   Ht 5\' 6"  (1.676 m)   Wt 146 lb (66.2 kg)   SpO2 96%   BMI 23.57 kg/m     Wt Readings from Last 3 Encounters:  12/12/19 146 lb (66.2 kg)  12/09/19 143 lb (64.9 kg)  11/28/19 145 lb (65.8 kg)     GEN: Patient is in no acute distress HEENT: Normal NECK: No JVD; No carotid bruits LYMPHATICS: No lymphadenopathy CARDIAC: Hear sounds regular, 2/6 systolic murmur at the apex. RESPIRATORY:  Clear to auscultation without rales, wheezing or rhonchi  ABDOMEN: Soft, non-tender, non-distended MUSCULOSKELETAL:  No edema; No deformity  SKIN: Warm and dry NEUROLOGIC:  Alert and oriented x 3 PSYCHIATRIC:  Normal affect   Signed, Jenean Lindau, MD  12/12/2019 3:08 PM    [place]

## 2019-12-12 NOTE — Patient Instructions (Signed)
Medication Instructions:  No medication changes. *If you need a refill on your cardiac medications before your next appointment, please call your pharmacy*   Lab Work: Your physician recommends that you have labs done in the office today. Your test included  basic metabolic panel, complete blood count, TSH, liver function and lipids.  If you have labs (blood work) drawn today and your tests are completely normal, you will receive your results only by: Marland Kitchen MyChart Message (if you have MyChart) OR . A paper copy in the mail If you have any lab test that is abnormal or we need to change your treatment, we will call you to review the results.   Testing/Procedures: Your physician has requested that you have a renal artery duplex. During this test, an ultrasound is used to evaluate blood flow to the kidneys. Allow one hour for this exam. Do not eat after midnight the day before and avoid carbonated beverages. Take your medications as you usually do.    Follow-Up: At [facility], you and your health needs are our priority.  As part of our continuing mission to provide you with exceptional heart care, we have created designated Provider Care Teams.  These Care Teams include your primary Cardiologist (physician) and Advanced Practice Providers (APPs -  Physician Assistants and Nurse Practitioners) who all work together to provide you with the care you need, when you need it.  We recommend signing up for the patient portal called "MyChart".  Sign up information is provided on this After Visit Summary.  MyChart is used to connect with patients for Virtual Visits (Telemedicine).  Patients are able to view lab/test results, encounter notes, upcoming appointments, etc.  Non-urgent messages can be sent to your provider as well.   To learn more about what you can do with MyChart, go to [email or web address].    Your next appointment:   2 month(s)  The format for your next appointment:   In  Person  Provider:   Jyl Heinz, MD   Other Instructions  Cardiac Rehabilitation What is cardiac rehabilitation? Cardiac rehabilitation is a treatment program that helps improve the health and well-being of people who have heart problems. Cardiac rehabilitation includes exercise training, education, and counseling to help you get stronger and return to an active lifestyle. This program can help you get better faster and reduce any future hospital stays. Why might I need cardiac rehabilitation? Cardiac rehabilitation programs can help when you have or have had:  A heart attack.  Heart failure.  Peripheral artery disease.  Coronary artery disease.  Angina.  Lung or breathing problems. Cardiac rehabilitation programs are also used when you have had:  Coronary artery bypass graft surgery.  Heart valve replacement.  Heart stent placement.  Heart transplant.  Aneurysm repair. What are the benefits of cardiac rehabilitation? Cardiac rehabilitation can help you:  Reduce problems like chest pain and trouble breathing.  Change risk factors that contribute to heart disease, such as: ? Smoking. ? High blood pressure. ? High cholesterol. ? Diabetes. ? Being inactive. ? Weighing over 30% more than your ideal weight. ? Diet.  Improve your emotional outlook so you feel: ? More hopeful. ? Better about yourself. ? More confident about taking care of yourself.  Get support from health experts as well as other people with similar problems.  Learn healthy ways to manage stress.  Learn how to manage and understand your medicines.  Teach your family about your condition and how to participate in your recovery. What  happens in cardiac rehabilitation? You will be assessed by a cardiac rehabilitation team. They will check your health history and do a physical exam. You may need blood tests, exercise stress tests, and other evaluations to make sure that you are ready to start  cardiac rehabilitation. The cardiac rehabilitation team works with you to make a plan based on your health and goals. Your program will be tailored to fit you and your needs and may change as you progress. You may work with a health care team that includes:  Doctors.  Nurses.  Dietitians.  Psychologists.  Exercise specialists.  Physical and occupational therapists. What are the phases of cardiac rehabilitation? A cardiac rehabilitation program is often divided into phases. You advance from one phase to the next. Phase 1 This phase starts while you are still in the hospital. You may:  Start by walking in your room and then in the hall.  Do some simple exercises with a therapist.  Phase 2 This phase begins when you go home or to another facility. You will travel to a cardiac rehabilitation center or another place where rehabilitation is offered. This phase may last 8-12 weeks. During this phase:  You will slowly increase your activity level while being closely watched by a nurse or therapist.  You will have medical tests and exams to monitor your progress.  Your exercises may include strength or resistance training along with activities that cause your heart to beat faster (aerobic exercises), such as walking on a treadmill.  Your condition will determine how often and how long these sessions last.  You may learn how to: ? Lacinda Axon heart-healthy meals. ? Control your blood sugar, if this applies. ? Stop smoking. ? Manage your medicines. You may need help with scheduling or planning how and when to take your medicines. If you have questions about your medicines, it is very important that you talk with your health care provider.  Phase 3 This phase continues for the rest of your life. In this phase:  There will be less supervision.  You may continue to participate in cardiac rehabilitation activities or become part of a group in your community.  You may benefit from talking  about your experience with other people who are facing similar challenges. Follow these instructions at home:  Take over-the-counter and prescription medicines only as told by your health care provider.  Keep all follow-up visits as told by your health care provider. This is important. Get help right away if:  You have severe chest discomfort, especially if the pain is crushing or pressure-like and spreads to your arms, back, neck, or jaw. Do not wait to see if the pain will go away.  You have weakness or numbness in your face, arms, or legs, especially on one side of the body.  Your speech is slurred.  You are confused.  You have a sudden, severe headache or loss of vision.  You have shortness of breath.  You are sweating and have nausea.  You feel dizzy or faint.  You are fatigued. These symptoms may represent a serious problem that is an emergency. Do not wait to see if the symptoms will go away. Get medical help right away. Call your local emergency services (911 in the [place] Do not drive yourself to the hospital. Summary  Cardiac rehabilitation is a treatment program that helps improve the health and well-being of people who have heart problems.  A cardiac rehabilitation program is often divided into phases. You advance  from one phase to the next.  The cardiac rehabilitation team works with you to make a plan based on your health and goals.  Cardiac rehabilitation includes exercise training, education, and counseling to help you get stronger and return to an active lifestyle. This information is not intended to replace advice given to you by your health care provider. Make sure you discuss any questions you have with your health care provider. Document Revised: 08/07/2018 Document Reviewed: 02/14/2018 Elsevier Patient Education  [place].

## 2019-12-13 LAB — LIPID PANEL
Chol/HDL Ratio: 3.6 ratio (ref 0.0–4.4)
Cholesterol, Total: 196 mg/dL (ref 100–199)
HDL: 54 mg/dL (ref >39)
LDL Chol Calc (NIH): 118 mg/dL — ABNORMAL HIGH (ref 0–99)
Triglycerides: 133 mg/dL (ref 0–149)
VLDL Cholesterol Cal: 24 mg/dL (ref 5–40)

## 2019-12-13 LAB — BASIC METABOLIC PANEL
BUN/Creatinine Ratio: 15 (ref 12–28)
BUN: 16 mg/dL (ref 8–27)
CO2: 26 mmol/L (ref 20–29)
Calcium: 9.4 mg/dL (ref 8.7–10.3)
Chloride: 96 mmol/L (ref 96–106)
Creatinine, Ser: 1.09 mg/dL — ABNORMAL HIGH (ref 0.57–1.00)
GFR calc Af Amer: 59 mL/min/{1.73_m2} — ABNORMAL LOW (ref >59)
GFR calc non Af Amer: 52 mL/min/{1.73_m2} — ABNORMAL LOW (ref >59)
Glucose: 154 mg/dL — ABNORMAL HIGH (ref 65–99)
Potassium: 4.9 mmol/L (ref 3.5–5.2)
Sodium: 136 mmol/L (ref 134–144)

## 2019-12-13 LAB — CBC WITH DIFFERENTIAL/PLATELET
Basophils Absolute: 0.1 10*3/uL (ref 0.0–0.2)
Basos: 1 %
EOS (ABSOLUTE): 0.2 10*3/uL (ref 0.0–0.4)
Eos: 2 %
Hematocrit: 31.3 % — ABNORMAL LOW (ref 34.0–46.6)
Hemoglobin: 10.3 g/dL — ABNORMAL LOW (ref 11.1–15.9)
Immature Grans (Abs): 0.1 10*3/uL (ref 0.0–0.1)
Immature Granulocytes: 1 %
Lymphocytes Absolute: 1.8 10*3/uL (ref 0.7–3.1)
Lymphs: 17 %
MCH: 29.3 pg (ref 26.6–33.0)
MCHC: 32.9 g/dL (ref 31.5–35.7)
MCV: 89 fL (ref 79–97)
Monocytes Absolute: 1.1 10*3/uL — ABNORMAL HIGH (ref 0.1–0.9)
Monocytes: 11 %
Neutrophils Absolute: 7.3 10*3/uL — ABNORMAL HIGH (ref 1.4–7.0)
Neutrophils: 68 %
Platelets: 552 10*3/uL — ABNORMAL HIGH (ref 150–450)
RBC: 3.51 x10E6/uL — ABNORMAL LOW (ref 3.77–5.28)
RDW: 13 % (ref 11.7–15.4)
WBC: 10.7 10*3/uL (ref 3.4–10.8)

## 2019-12-13 LAB — HEPATIC FUNCTION PANEL
ALT: 8 IU/L (ref 0–32)
AST: 10 IU/L (ref 0–40)
Albumin: 3.6 g/dL — ABNORMAL LOW (ref 3.8–4.8)
Alkaline Phosphatase: 90 IU/L (ref 48–121)
Bilirubin Total: 0.3 mg/dL (ref 0.0–1.2)
Bilirubin, Direct: 0.11 mg/dL (ref 0.00–0.40)
Total Protein: 6.6 g/dL (ref 6.0–8.5)

## 2019-12-13 LAB — TSH: TSH: 3.82 u[IU]/mL (ref 0.450–4.500)

## 2019-12-31 ENCOUNTER — Telehealth: Payer: Self-pay | Admitting: Cardiology

## 2019-12-31 NOTE — Telephone Encounter (Signed)
Pt aware that she has an appt tomorrow for renal US. Pt verbalized understanding and had no additional questions.

## 2019-12-31 NOTE — Telephone Encounter (Signed)
New Message:    Pt said she received several call from this office today. She does not know who called her.

## 2020-01-01 ENCOUNTER — Ambulatory Visit (INDEPENDENT_AMBULATORY_CARE_PROVIDER_SITE_OTHER): Payer: Medicare Other

## 2020-01-01 ENCOUNTER — Other Ambulatory Visit: Payer: Self-pay

## 2020-01-01 DIAGNOSIS — R0989 Other specified symptoms and signs involving the circulatory and respiratory systems: Secondary | ICD-10-CM

## 2020-01-01 DIAGNOSIS — I2510 Atherosclerotic heart disease of native coronary artery without angina pectoris: Secondary | ICD-10-CM | POA: Diagnosis not present

## 2020-01-01 NOTE — Progress Notes (Signed)
Renal artery duplex exam performed.   Jimmy Ottilia Pippenger RDCS, RVT  

## 2020-01-02 ENCOUNTER — Telehealth: Payer: Self-pay

## 2020-01-02 NOTE — Telephone Encounter (Signed)
Spoke with patient regarding results and recommendation.  Patient verbalizes understanding and is agreeable to plan of care. Advised patient to call back with any issues or concerns.  

## 2020-01-02 NOTE — Telephone Encounter (Signed)
-----   Message from Jenean Lindau, MD sent at 01/02/2020  1:22 PM EDT ----- No significant blockages in the renal arteries.  The results of the study is unremarkable. Please inform patient. I will discuss in detail at next appointment. Cc  primary care/referring physician Jenean Lindau, MD 01/02/2020 1:22 PM

## 2020-01-18 ENCOUNTER — Other Ambulatory Visit: Payer: Self-pay | Admitting: Thoracic Surgery (Cardiothoracic Vascular Surgery)

## 2020-01-29 ENCOUNTER — Other Ambulatory Visit: Payer: Self-pay

## 2020-01-29 NOTE — Telephone Encounter (Signed)
Pt calling requesting a refill on these medications nitroglycerin, lisinopril, omeprazole, amiodarone, atorvastatin and metoprolol. Would Dr. Geraldo Pitter like to refill these medications? Please address

## 2020-02-10 DIAGNOSIS — E119 Type 2 diabetes mellitus without complications: Secondary | ICD-10-CM | POA: Insufficient documentation

## 2020-02-11 MED ORDER — NITROGLYCERIN 0.4 MG SL SUBL: 0.4000 mg | SUBLINGUAL_TABLET | SUBLINGUAL | 8 refills | Status: DC | PRN

## 2020-02-11 MED ORDER — ATORVASTATIN CALCIUM 40 MG PO TABS: 40.0000 mg | ORAL_TABLET | Freq: Every day | ORAL | 2 refills | Status: DC

## 2020-02-11 MED ORDER — AMIODARONE HCL 200 MG PO TABS: 200.0000 mg | ORAL_TABLET | Freq: Every day | ORAL | 2 refills | Status: DC

## 2020-02-11 MED ORDER — LISINOPRIL 20 MG PO TABS: 20.0000 mg | ORAL_TABLET | Freq: Every day | ORAL | 2 refills | Status: DC

## 2020-02-11 MED ORDER — OMEPRAZOLE 40 MG PO CPDR: 40.0000 mg | DELAYED_RELEASE_CAPSULE | Freq: Every day | ORAL | 2 refills | Status: DC

## 2020-02-11 MED ORDER — METOPROLOL TARTRATE 50 MG PO TABS: 50.0000 mg | ORAL_TABLET | Freq: Two times a day (BID) | ORAL | 2 refills | Status: DC

## 2020-02-12 ENCOUNTER — Ambulatory Visit: Payer: Medicare Other | Admitting: Cardiology

## 2020-02-19 ENCOUNTER — Other Ambulatory Visit: Payer: Self-pay

## 2020-02-19 ENCOUNTER — Emergency Department (HOSPITAL_COMMUNITY): Payer: Medicare Other

## 2020-02-19 ENCOUNTER — Encounter (HOSPITAL_COMMUNITY): Payer: Self-pay | Admitting: Emergency Medicine

## 2020-02-19 ENCOUNTER — Telehealth: Payer: Self-pay | Admitting: Cardiology

## 2020-02-19 ENCOUNTER — Observation Stay (HOSPITAL_COMMUNITY)
Admission: EM | Admit: 2020-02-19 | Discharge: 2020-02-20 | Disposition: A | Discharge status: Home / Self Care | Payer: Medicare Other | Attending: Cardiology | Admitting: Cardiology

## 2020-02-19 DIAGNOSIS — R55 Syncope and collapse: Secondary | ICD-10-CM | POA: Diagnosis not present

## 2020-02-19 DIAGNOSIS — Z87891 Personal history of nicotine dependence: Secondary | ICD-10-CM | POA: Insufficient documentation

## 2020-02-19 DIAGNOSIS — Z7982 Long term (current) use of aspirin: Secondary | ICD-10-CM | POA: Diagnosis not present

## 2020-02-19 DIAGNOSIS — Z79899 Other long term (current) drug therapy: Secondary | ICD-10-CM | POA: Diagnosis not present

## 2020-02-19 DIAGNOSIS — I119 Hypertensive heart disease without heart failure: Secondary | ICD-10-CM | POA: Insufficient documentation

## 2020-02-19 DIAGNOSIS — E875 Hyperkalemia: Secondary | ICD-10-CM | POA: Insufficient documentation

## 2020-02-19 DIAGNOSIS — I10 Essential (primary) hypertension: Secondary | ICD-10-CM | POA: Diagnosis present

## 2020-02-19 DIAGNOSIS — I6381 Other cerebral infarction due to occlusion or stenosis of small artery: Secondary | ICD-10-CM

## 2020-02-19 DIAGNOSIS — Z20822 Contact with and (suspected) exposure to covid-19: Secondary | ICD-10-CM | POA: Insufficient documentation

## 2020-02-19 DIAGNOSIS — I25119 Atherosclerotic heart disease of native coronary artery with unspecified angina pectoris: Secondary | ICD-10-CM | POA: Diagnosis not present

## 2020-02-19 DIAGNOSIS — E119 Type 2 diabetes mellitus without complications: Secondary | ICD-10-CM | POA: Diagnosis not present

## 2020-02-19 DIAGNOSIS — Z7984 Long term (current) use of oral hypoglycemic drugs: Secondary | ICD-10-CM | POA: Diagnosis not present

## 2020-02-19 DIAGNOSIS — E088 Diabetes mellitus due to underlying condition with unspecified complications: Secondary | ICD-10-CM | POA: Diagnosis present

## 2020-02-19 DIAGNOSIS — J90 Pleural effusion, not elsewhere classified: Secondary | ICD-10-CM

## 2020-02-19 DIAGNOSIS — E039 Hypothyroidism, unspecified: Secondary | ICD-10-CM | POA: Diagnosis not present

## 2020-02-19 HISTORY — DX: Syncope and collapse: R55

## 2020-02-19 HISTORY — DX: Other cerebral infarction due to occlusion or stenosis of small artery: I63.81

## 2020-02-19 LAB — POTASSIUM: Potassium: 5.2 mmol/L — ABNORMAL HIGH (ref 3.5–5.1)

## 2020-02-19 LAB — URINALYSIS, ROUTINE W REFLEX MICROSCOPIC
Bacteria, UA: NONE SEEN
Bilirubin Urine: NEGATIVE
Glucose, UA: NEGATIVE mg/dL
Hgb urine dipstick: NEGATIVE
Ketones, ur: NEGATIVE mg/dL
Nitrite: NEGATIVE
Protein, ur: NEGATIVE mg/dL
Specific Gravity, Urine: 1.005 (ref 1.005–1.030)
pH: 7 (ref 5.0–8.0)

## 2020-02-19 LAB — TROPONIN I (HIGH SENSITIVITY)
Troponin I (High Sensitivity): 7 ng/L (ref <18)
Troponin I (High Sensitivity): 8 ng/L (ref <18)

## 2020-02-19 LAB — RESPIRATORY PANEL BY RT PCR (FLU A&B, COVID)
Influenza A by PCR: NEGATIVE
Influenza B by PCR: NEGATIVE
SARS Coronavirus 2 by RT PCR: NEGATIVE

## 2020-02-19 LAB — BASIC METABOLIC PANEL
Anion gap: 8 (ref 5–15)
BUN: 17 mg/dL (ref 8–23)
CO2: 27 mmol/L (ref 22–32)
Calcium: 9.3 mg/dL (ref 8.9–10.3)
Chloride: 100 mmol/L (ref 98–111)
Creatinine, Ser: 1.15 mg/dL — ABNORMAL HIGH (ref 0.44–1.00)
GFR, Estimated: 51 mL/min — ABNORMAL LOW (ref >60)
Glucose, Bld: 184 mg/dL — ABNORMAL HIGH (ref 70–99)
Potassium: 5.8 mmol/L — ABNORMAL HIGH (ref 3.5–5.1)
Sodium: 135 mmol/L (ref 135–145)

## 2020-02-19 LAB — CBC
HCT: 38.3 % (ref 36.0–46.0)
Hemoglobin: 12 g/dL (ref 12.0–15.0)
MCH: 29.3 pg (ref 26.0–34.0)
MCHC: 31.3 g/dL (ref 30.0–36.0)
MCV: 93.6 fL (ref 80.0–100.0)
Platelets: 383 10*3/uL (ref 150–400)
RBC: 4.09 MIL/uL (ref 3.87–5.11)
RDW: 14.4 % (ref 11.5–15.5)
WBC: 8.6 10*3/uL (ref 4.0–10.5)
nRBC: 0 % (ref 0.0–0.2)

## 2020-02-19 LAB — CBG MONITORING, ED: Glucose-Capillary: 161 mg/dL — ABNORMAL HIGH (ref 70–99)

## 2020-02-19 LAB — TSH: TSH: 1.111 u[IU]/mL (ref 0.350–4.500)

## 2020-02-19 MED ORDER — PAROXETINE HCL 10 MG PO TABS
10.0000 mg | ORAL_TABLET | Freq: Every day | ORAL | Status: DC
  Administered 2020-02-19 – 2020-02-20 (×2): 10 mg via ORAL
  Filled 2020-02-19 (×2): qty 1

## 2020-02-19 MED ORDER — SODIUM CHLORIDE 0.9% FLUSH: 3.0000 mL | INTRAVENOUS | Status: DC | PRN

## 2020-02-19 MED ORDER — ACETAMINOPHEN 325 MG PO TABS: 650.0000 mg | ORAL_TABLET | ORAL | Status: DC | PRN

## 2020-02-19 MED ORDER — SODIUM CHLORIDE 0.9 % IV SOLN: 250.0000 mL | INTRAVENOUS | Status: DC | PRN

## 2020-02-19 MED ORDER — LEVOTHYROXINE SODIUM 50 MCG PO TABS
50.0000 ug | ORAL_TABLET | Freq: Every day | ORAL | Status: DC
  Administered 2020-02-20: 50 ug via ORAL
  Filled 2020-02-19: qty 1

## 2020-02-19 MED ORDER — ASPIRIN EC 81 MG PO TBEC
81.0000 mg | DELAYED_RELEASE_TABLET | Freq: Every day | ORAL | Status: DC
  Administered 2020-02-19 – 2020-02-20 (×2): 81 mg via ORAL
  Filled 2020-02-19 (×2): qty 1

## 2020-02-19 MED ORDER — ATORVASTATIN CALCIUM 80 MG PO TABS
80.0000 mg | ORAL_TABLET | Freq: Every day | ORAL | Status: DC
  Administered 2020-02-20: 80 mg via ORAL
  Filled 2020-02-19: qty 1

## 2020-02-19 MED ORDER — HYDRALAZINE HCL 50 MG PO TABS
50.0000 mg | ORAL_TABLET | Freq: Three times a day (TID) | ORAL | Status: DC
  Administered 2020-02-19 – 2020-02-20 (×4): 50 mg via ORAL
  Filled 2020-02-19 (×2): qty 1
  Filled 2020-02-19: qty 2
  Filled 2020-02-19: qty 1

## 2020-02-19 MED ORDER — ALPRAZOLAM 0.25 MG PO TABS: 0.2500 mg | ORAL_TABLET | Freq: Two times a day (BID) | ORAL | Status: DC | PRN

## 2020-02-19 MED ORDER — ONDANSETRON HCL 4 MG/2ML IJ SOLN: 4.0000 mg | Freq: Four times a day (QID) | INTRAMUSCULAR | Status: DC | PRN

## 2020-02-19 MED ORDER — AMLODIPINE BESYLATE 5 MG PO TABS
5.0000 mg | ORAL_TABLET | Freq: Every day | ORAL | Status: DC
  Administered 2020-02-19 – 2020-02-20 (×2): 5 mg via ORAL
  Filled 2020-02-19 (×2): qty 1

## 2020-02-19 MED ORDER — ZOLPIDEM TARTRATE 5 MG PO TABS: 5.0000 mg | ORAL_TABLET | Freq: Every evening | ORAL | Status: DC | PRN

## 2020-02-19 MED ORDER — PANTOPRAZOLE SODIUM 40 MG PO TBEC
40.0000 mg | DELAYED_RELEASE_TABLET | Freq: Every day | ORAL | Status: DC
  Administered 2020-02-20: 40 mg via ORAL
  Filled 2020-02-19: qty 1

## 2020-02-19 MED ORDER — ENOXAPARIN SODIUM 40 MG/0.4ML ~~LOC~~ SOLN
40.0000 mg | SUBCUTANEOUS | Status: DC
  Administered 2020-02-19: 40 mg via SUBCUTANEOUS
  Filled 2020-02-19: qty 0.4

## 2020-02-19 MED ORDER — METFORMIN HCL 500 MG PO TABS
500.0000 mg | ORAL_TABLET | Freq: Two times a day (BID) | ORAL | Status: DC
  Administered 2020-02-19 – 2020-02-20 (×2): 500 mg via ORAL
  Filled 2020-02-19 (×2): qty 1

## 2020-02-19 MED ORDER — SODIUM CHLORIDE 0.9% FLUSH
3.0000 mL | Freq: Two times a day (BID) | INTRAVENOUS | Status: DC
  Administered 2020-02-19 – 2020-02-20 (×2): 3 mL via INTRAVENOUS

## 2020-02-19 MED ORDER — NITROGLYCERIN 0.4 MG SL SUBL: 0.4000 mg | SUBLINGUAL_TABLET | SUBLINGUAL | Status: DC | PRN

## 2020-02-19 MED ORDER — SODIUM CHLORIDE 0.9 % IV SOLN: INTRAVENOUS | Status: DC

## 2020-02-19 NOTE — Telephone Encounter (Signed)
Pt c/o feeling lightheaded and states that her heart rate is going down into the 40's. Pt has had 2 syncopal episodes in the past month. Pt states today her BP is normal but hr is 42. Pt advised to go to the ED for evaluation. Pt verbalized understanding and had no additional questions.

## 2020-02-19 NOTE — ED Triage Notes (Signed)
Pt states she had syncope episode x 2 with a HR 46 wants to be check.

## 2020-02-19 NOTE — H&P (Addendum)
Cardiology History and Physical:   Patient ID: Courtney Gardner [id number]; 07-Dec-1950   Admit date: 02/19/2020 Date of Consult: 02/19/2020  Primary Care Provider: [facility] Primary Cardiologist: Jenean Lindau, MD 12/12/2019 Primary Electrophysiologist:  None   Patient Profile:   Courtney Gardner is a 69 y.o. 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  DC 6/9 after her bypass surgery on amiodarone 200 mg daily and Lopressor 50 mg twice daily (among other meds), heart rate 50s at times.  Admitted 6/18-6/20/2021 for AKI w/ Cr 1.42, and Na+ 128, Cl 87, pleural effusion noted, patient felt volume depleted, sodium 128 and creatinine 1.42, patient having problems with diarrhea at that time, Lopressor held due to hypotension.  Outpatient thoracentesis 12/04/2019 of 1 L   History of Present Illness:   Courtney Gardner was doing well when seen by Dr Geraldo Pitter 08/13. She was encouraged to enroll in cardiac rehab. Maintaining SR on amio, had PAF post-op, continue amio for now, [place] to eval abd bruit (was ok), f/u 2 months.  Pt had 2 syncopal episodes 10/19. Called the office today and came to the ER as requested. HR 46.  Courtney Gardner had a syncopal episode about a month ago.  She got up from a chair walked into the kitchen.  She woke up on the floor.  Family was not present, her loss of consciousness was probably very brief.  She remembers feeling dizzy after she stood up from the chair.  She denies any kind of palpitations, shortness of breath, or chest pain.  Symptoms resolved and she would feel a little lightheaded after standing up upon occasion, but no presyncope.  2 days ago, she and her daughter were out at the barn and she was doing some light work.  She felt tired and  sat down to rest.  Her family was helping her back to the house, when she lost consciousness.  She remembers feeling lightheaded and weak.  She had not been following her blood pressure.  Right after the surgery, her systolic blood pressure ranged from 123-147.  After the syncopal episode, they charged up the blood pressure cuff and found out that her heart rate was in the 26S, and her systolic blood pressure was very elevated.  Currently, in the emergency room, her heart rate is in the mid 40s and she is asymptomatic.  She has not worn a heart monitor.    Past Medical History:  Diagnosis Date  . Abdominal bruit 12/12/2019  . AKI (acute kidney injury) ([place]) 10/17/2019  . Angina pectoris ([place]) 11/20/2017  . Barrett's esophagus 09/30/2019  . Coronary artery disease involving native coronary artery of native heart 12/26/2017  . Diabetes ([place]) 09/30/2019   Type 2 on glucophage  . Diabetes mellitus due to underlying condition with unspecified complications ([place]) 3/41/9622  . Diabetes mellitus without complication ([place])   . Essential hypertension 11/20/2017  . Ex-smoker 11/20/2017  . Glaucoma 09/30/2019  . Hyperlipidemia 11/20/2017  . Hyperlipidemia associated with type 2 diabetes mellitus ([place]) 10/17/2019  . Hypertension associated with diabetes ([place]) 11/20/2017  . Hypothyroidism 10/17/2019  . Iliotibial band syndrome of left side 09/09/2015  . Palpitations 08/12/2019  . Paroxysmal atrial fibrillation ([place]) 10/17/2019  . Pleural effusion on left 10/17/2019  . Renal calculi  09/30/2019    Past Surgical History:  Procedure Laterality Date  . CHOLECYSTECTOMY    . CORONARY ARTERY BYPASS GRAFT N/A 10/02/2019   Procedure: CORONARY ARTERY BYPASS GRAFTING (CABG) TIMES TWO, ON PUMP, USING LEFT INTERNAL MAMMARY ARTERY AND RIGHT GREATER SAPHENOUS VEIN HARVESTED ENDOSCOPICALLY;  Surgeon: [name], MD;  Location: [facility];  Service: Open Heart Surgery;  Laterality: N/A;  LIMA to LAD, SVG to OM 1  . INTRAVASCULAR  PRESSURE WIRE/FFR STUDY N/A 11/23/2017   Procedure: INTRAVASCULAR PRESSURE WIRE/FFR STUDY;  Surgeon: Jettie Booze, MD;  Location: [facility];  Service: Cardiovascular;  Laterality: N/A;  . IR THORACENTESIS ASP PLEURAL SPACE W/IMG GUIDE  12/04/2019  . LEFT HEART CATH AND CORONARY ANGIOGRAPHY N/A 11/23/2017   Procedure: LEFT HEART CATH AND CORONARY ANGIOGRAPHY;  Surgeon: Jettie Booze, MD;  Location: [facility];  Service: Cardiovascular;  Laterality: N/A;  . LEFT HEART CATH AND CORONARY ANGIOGRAPHY N/A 09/30/2019   Procedure: LEFT HEART CATH AND CORONARY ANGIOGRAPHY;  Surgeon: Troy Sine, MD;  Location: [facility];  Service: Cardiovascular;  Laterality: N/A;  . MEDIASTINAL EXPLORATION N/A 10/03/2019   Procedure: MEDIASTINAL WASHOUT;  Surgeon: [name], MD;  Location: [facility];  Service: Thoracic;  Laterality: N/A;  . TEE WITHOUT CARDIOVERSION N/A 10/02/2019   Procedure: TRANSESOPHAGEAL ECHOCARDIOGRAM (TEE);  Surgeon: [name], MD;  Location: [facility];  Service: Open Heart Surgery;  Laterality: N/A;  . TUBAL LIGATION       Prior to Admission medications   Medication Sig Start Date End Date Taking? Authorizing Provider  amiodarone (PACERONE) 200 MG tablet Take 1 tablet (200 mg total) by mouth daily. 02/11/20  Yes Revankar, Reita Cliche, MD  aspirin EC 81 MG tablet Take 1 tablet (81 mg total) by mouth daily. 09/25/19  Yes Revankar, Reita Cliche, MD  atorvastatin (LIPITOR) 80 MG tablet Take 80 mg by mouth daily. 02/06/20  Yes [provider]  levothyroxine (SYNTHROID, LEVOTHROID) 50 MCG tablet Take 50 mcg by mouth daily before breakfast.  11/12/17  Yes [provider]  lisinopril (ZESTRIL) 20 MG tablet Take 1 tablet (20 mg total) by mouth daily. 02/11/20  Yes Revankar, Reita Cliche, MD  metFORMIN (GLUCOPHAGE) 500 MG tablet Take 500 mg by mouth 2 (two) times daily with a meal.  07/21/19  Yes [provider]  metoprolol tartrate (LOPRESSOR) 50  MG tablet Take 1 tablet (50 mg total) by mouth 2 (two) times daily. 02/11/20  Yes Revankar, Reita Cliche, MD  nitroGLYCERIN (NITROSTAT) 0.4 MG SL tablet Place 1 tablet (0.4 mg total) under the tongue every 5 (five) minutes as needed for chest pain. 02/11/20 05/11/20 Yes Revankar, Reita Cliche, MD  omeprazole (PRILOSEC) 40 MG capsule Take 1 capsule (40 mg total) by mouth daily. 02/11/20  Yes Revankar, Reita Cliche, MD  PARoxetine (PAXIL) 10 MG tablet Take 10 mg by mouth daily. 06/29/19  Yes [provider]  Vitamin D, Ergocalciferol, (DRISDOL) 1.25 MG (50000 UNIT) CAPS capsule Take 50,000 Units by mouth once a week. 02/06/20  Yes [provider]  acetaminophen (TYLENOL) 500 MG tablet Take 2 tablets (1,000 mg total) by mouth every 6 (six) hours as needed. Patient not taking: Reported on 02/19/2020 10/07/19   Barrett, Lodema Hong, PA-C  atorvastatin (LIPITOR) 40 MG tablet Take 1 tablet (40 mg total) by mouth at bedtime. 02/11/20   Revankar, Reita Cliche, MD    Inpatient Medications: Scheduled Meds:  Continuous Infusions:  PRN Meds:  Allergies:   No Known Allergies  Social History:   Social History   Socioeconomic History  . Marital status: Unknown    Spouse name: Not on file  . Number of children: Not on file  . Years of education: Not on file  . Highest education level: Not on file  Occupational History  . Not on file  Tobacco Use  . Smoking status: Former Research scientist (life sciences)  . Smokeless tobacco: Never Used  Vaping Use  . Vaping Use: Never used  Substance and Sexual Activity  . Alcohol use: Not Currently  . Drug use: Never  . Sexual activity: Not on file  Other Topics Concern  . Not on file  Social History Narrative  . Not on file   Social Determinants of Health   Financial Resource Strain:   . Difficulty of Paying Living Expenses: Not on file  Food Insecurity:   . Worried About Charity fundraiser in the Last Year: Not on file  . Ran Out of Food in the Last Year: Not on file    Transportation Needs:   . Lack of Transportation (Medical): Not on file  . Lack of Transportation (Non-Medical): Not on file  Physical Activity:   . Days of Exercise per Week: Not on file  . Minutes of Exercise per Session: Not on file  Stress:   . Feeling of Stress : Not on file  Social Connections:   . Frequency of Communication with Friends and Family: Not on file  . Frequency of Social Gatherings with Friends and Family: Not on file  . Attends Religious Services: Not on file  . Active Member of Clubs or Organizations: Not on file  . Attends Archivist Meetings: Not on file  . Marital Status: Not on file  Intimate Partner Violence:   . Fear of Current or Ex-Partner: Not on file  . Emotionally Abused: Not on file  . Physically Abused: Not on file  . Sexually Abused: Not on file    Family History:   Family History  Problem Relation Age of Onset  . Leukemia Mother   . Lung cancer Father   . Hypertension Sister   . Heart attack Maternal Grandmother   . Heart attack Maternal Grandfather   . Heart attack Maternal Uncle    Family Status:  Family Status  Relation Name Status  . Mother  (Not Specified)  . Father  (Not Specified)  . Sister  (Not Specified)  . MGM  (Not Specified)  . MGF  (Not Specified)  . Mat Uncle  (Not Specified)    ROS:  Please see the history of present illness.  All other ROS reviewed and negative.     Physical Exam/Data:   Vitals:   02/19/20 1157 02/19/20 1200  BP: 114/76 (!) 161/69  Pulse: 70 (!) 47  Resp:  14  Temp: 97.8 F (36.6 C) (!) 97.1 F (36.2 C)  TempSrc: Oral Oral  SpO2: 100% 99%  Weight:  66.2 kg  Height:  5\' 6"  (1.676 m)   No intake or output data in the 24 hours ending 02/19/20 1520  Last 3 Weights 02/19/2020 12/12/2019 12/09/2019  Weight (lbs) 145 lb 15.1 oz 146 lb 143 lb  Weight (kg) 66.2 kg 66.225 kg 64.864 kg     Body mass index is 23.56 kg/m.   Orthostatic VS Position BP HR  Lying 187/73 46  Sitting  203/78 48  Standing 182/72 50  Standing at 3" 211/80 50  General:  Well nourished, slender, female in no acute distress HEENT: normal Lymph: no adenopathy Neck: JVD -not elevated Endocrine:  No thryomegaly Vascular: No carotid bruits; 4/4 extremity pulses 2+  Cardiac:  normal S1, S2; RRR; 2/6 murmur Lungs:  clear bilaterally, no wheezing, rhonchi or rales  Abd: soft, nontender, no hepatomegaly  Ext: no edema Musculoskeletal:  No deformities, BUE and BLE strength normal and equal Skin: warm and dry, all incisions are well-healed Neuro:  CNs 2-12 intact, no focal abnormalities noted Psych:  Normal affect   EKG:  The EKG was personally reviewed and demonstrates:  Sinus brady, HR 46, no acute changes Telemetry:  Telemetry was personally reviewed and demonstrates: Sinus bradycardia   CV studies:   ABD [place]: 01/01/2020 Summary:  Renal:    Right: Normal size right kidney. 1-59% stenosis of the right renal     artery. lower range of stenosis.  Left: Normal size of left kidney. 1-59% stenosis of the left renal     artery. lower range of stenosis.  Mesenteric:  Normal Celiac artery and Superior Mesenteric artery findings.     ECHO: 09/11/2019 1. Left ventricular ejection fraction, by estimation, is 50 to 55%. The  left ventricle has low normal function. The left ventricle has no regional  wall motion abnormalities. Left ventricular diastolic parameters are  indeterminate.  2. Right ventricular systolic function is normal. The right ventricular  size is normal.  3. The mitral valve is normal in structure. Trivial mitral valve  regurgitation. No evidence of mitral stenosis.  4. The aortic valve is normal in structure. Aortic valve regurgitation is  not visualized. No aortic stenosis is present.  5. The inferior vena cava is normal in size with greater than 50%  respiratory variability, suggesting right atrial pressure of 3 mmHg.   TEE INTRA-OP:  10/02/2019 PRE-OP FINDINGS  Left Ventricle: The left ventricle has normal systolic function, with an  ejection fraction of 60-65%. The cavity size was normal. There is  moderately increased left ventricular wall thickness. No evidence of left  ventricular regional wall motion  abnormalities. LV wall thickness 10 mm. E/A was 0.74 m/s. DT 215 ms.  Suggesting Stage I diastolic disease.   Right Ventricle: The right ventricle has normal systolic function. The  cavity was normal. There is no increase in right ventricular wall  thickness.   Left Atrium: Left atrial size was normal in size.   Right Atrium: Right atrial size was normal in size. Right atrial pressure  is estimated at 10 mmHg.   Interatrial Septum: No atrial level shunt detected by color flow Doppler.   Pericardium: There is no evidence of pericardial effusion.   Mitral Valve: The mitral valve is normal in structure. Mitral valve  regurgitation is not visualized by color flow Doppler.   Tricuspid Valve: The tricuspid valve was normal in structure. Tricuspid  valve regurgitation was not visualized by color flow Doppler.   Aortic Valve: The aortic valve is tricuspid Aortic valve regurgitation was  not visualized by color flow Doppler. There is no evidence of aortic valve  stenosis.   Pulmonic Valve: The pulmonic valve was normal in structure.  Pulmonic valve regurgitation is trivial by color flow Doppler.    Aorta: The aortic root, ascending aorta and aortic arch are normal in size  and structure.   CATH: 09/30/2019  Prox RCA lesion is 30% stenosed.  Ost LM to Dist LM lesion is 75% stenosed.  Mid Cx to Dist Cx lesion is 40%  stenosed.  1st Mrg lesion is 65% stenosed.  Prox LAD lesion is 70% stenosed.  The left ventricular systolic function is normal.  LV end diastolic pressure is normal.  The left ventricular ejection fraction is 55-65% by visual estimate.   Significant coronary calcification involving the  left main with catheter dampening with each engagement and stenosis of 75%, calcification of the proximal LAD with narrowing up to 70%; 70% stenoses in the circumflex marginal vessel with mild 40% mid stenosis and a dominant left circumflex vessel and nondominant RCA with 30% proximal stenosis.  Normal LV function with EF estimated at 60 to 65%.  LVEDP 14 mm.  Definitely hypertensive throughout the catheterization; IV nitroglycerin was started at the end of the procedure.  RECOMMENDATION: Angiograms were reviewed with colleagues who agreed with the angiographic significance of the left main.  Will admit patient to the hospital.  Titrate IV nitroglycerin. Adjunctive medical therapy for CAD.  Surgical consultation for CABG revascularization.  Aggressive lipid intervention.      Laboratory Data:   Chemistry Recent Labs  Lab 02/19/20 1211  NA 135  K 5.8*  CL 100  CO2 27  GLUCOSE 184*  BUN 17  CREATININE 1.15*  CALCIUM 9.3  GFRNONAA 51*  ANIONGAP 8    Lab Results  Component Value Date   ALT 8 12/12/2019   AST 10 12/12/2019   ALKPHOS 90 12/12/2019   BILITOT 0.3 12/12/2019   Hematology Recent Labs  Lab 02/19/20 1211  WBC 8.6  RBC 4.09  HGB 12.0  HCT 38.3  MCV 93.6  MCH 29.3  MCHC 31.3  RDW 14.4  PLT 383   Cardiac Enzymes High Sensitivity Troponin:   Recent Labs  Lab 02/19/20 1211  TROPONINIHS 7      BNPNo results for input(s): BNP, PROBNP in the last 168 hours.  DDimer No results for input(s): DDIMER in the last 168 hours. TSH:  Lab Results  Component Value Date   TSH 3.820 12/12/2019   Lipids: Lab Results  Component Value Date   CHOL 196 12/12/2019   HDL 54 12/12/2019   LDLCALC 118 (H) 12/12/2019   TRIG 133 12/12/2019   CHOLHDL 3.6 12/12/2019   HgbA1c: Lab Results  Component Value Date   HGBA1C 7.7 (H) 10/01/2019   Magnesium:  Magnesium  Date Value Ref Range Status  10/19/2019 1.5 (L) 1.7 - 2.4 mg/dL Final    Comment:    Performed at  [facility], [address], [place], [place] [address]     Radiology/Studies:  CT HEAD WO CONTRAST  Result Date: 02/19/2020 CLINICAL DATA:  Syncope. EXAM: CT HEAD WITHOUT CONTRAST TECHNIQUE: Contiguous axial images were obtained from the base of the skull through the vertex without intravenous contrast. COMPARISON:  CT head 11/11/2017 FINDINGS: Brain: Hypodensities within the left caudate head and right thalamus, favored remote. Mild ex vacuo dilation of the left frontal horn. No evidence of acute large vascular territory infarct. No acute hemorrhage. No hydrocephalus. Mild diffuse cerebral atrophy with ex vacuo ventricular dilation. No mass lesion or abnormal mass effect. Vascular: Calcific atherosclerosis. Skull: No acute fracture. Sinuses/Orbits: Visualized sinuses are clear.  Unremarkable orbits. Other: No mastoid effusion. 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 If there is concern for acute infarction then MRI could better evaluate. Electronically Signed   By: Margaretha Sheffield MD   On: 02/19/2020 13:43    Assessment and Plan:   1.  Syncope - orthostatic VS not strongly positive, but HR changed very little with position change. -Symptoms were orthostatic in nature and may be related to poor p.o. intake and/or chronotropic effects of beta-blocker -Encourage p.o. fluids -DC beta-blocker and amiodarone -Check orthostatic vital signs and ambulate with assistance in a.m. to determine if heart rate response to exertion -Then decide if outpatient monitor is needed -Of note, Ms. Macphee got a 30-day supply of the metoprolol 50 mg twice daily in August, so may have been taking it at the time of her syncopal episode in September.  She would have been out for a period of time, but restarted the metoprolol on October 13, syncopal episode was October 19  2.  Hyperkalemia -Patient is not aware of any excess potassium  intake, does not take supplements -No symptoms related to this -Encourage p.o. fluid intake, recheck in a.m.  3.  Hypertension: -Lisinopril is on hold because of the hyperkalemia -Metoprolol is on hold because of the bradycardia -Continues as needed hydralazine and start amlodipine. -Believe that her blood pressure has been running high at home, so cannot afford to bring it down gradually  4.  Electrolyte abnormalities: -It is a little higher than normal, recheck in a.m.  She has a history of hypomagnesemia, check this as well   Active Problems:   * No active hospital problems. *     For questions or updates, please contact [place] Please consult [email or web address] for contact info under Cardiology/STEMI.   Signed, Rosaria Ferries, PA-C  02/19/2020 3:20 PM  Patient seen and examined with Rhobda Barrett, PA and agree with above.  In brief, the patient is a 69 y.o. 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  Patient with episode of syncope that sound like it may be orthostatic in nature vs chronotropic incompetence in the setting of continued BB and amiodarone use with HR 40s. Poor PO intake at home. Orthostatics mildly positive on our exam and notably HR failed to augment with position changes suggestive that underlying bradycardia may have been the underlying cause of her syncope. No history of VT or unstable arrhythmias and patient is s/p ACB with no exertional chest pain with low suspicion of CAD as driver of event. No HF symptoms and last TTE with normal LVEF. Will observe and hold nodal agents.   Exam: GEN: No acute distress.   Neck: No JVD Cardiac: bradycardic, regular, no murmurs, rubs, or gallops.  Respiratory: Clear to auscultation bilaterally. GI: Soft, nontender, non-distended   MS: No edema; No deformity. Neuro:  Nonfocal  Psych: Normal affect   Plan: -Hold metoprolol and amiodarone -Encourage PO intake of fluids and regular meals -Monitor on telemetry overnight -Orthostatics in the AM and ambulate patient to ensure HR augments -Start amlodipine and hydralazine for hypertension; no ACE/ARB due to hyperK and no HCTZ due to orthostasis -Hopefully discharge tomorrow likely with long-term monitor with close cardiology follow-up  Gwyndolyn Kaufman, MD

## 2020-02-19 NOTE — ED Notes (Signed)
Pt transported to XRAY °

## 2020-02-19 NOTE — ED Provider Notes (Addendum)
[facility] Provider Note   CSN: [id number] Arrival date & time: 02/19/20  1148     History Chief Complaint  Patient presents with  . Loss of Consciousness    Courtney Gardner is a 69 y.o. female with PMH of CAD s/p CABG 10/02/2019, T2DM, HTN, HLD, hypothyroidism, and PAF who presents the ED with complaints of syncope.  I reviewed patient's medical record and she was admitted to the hospital in early June 2021 for angina and ultimately required cardiac catheterization and CABG.  Patient was then subsequently managed for thereafter for AKI in the setting of dehydration.  Dr. Kipp Brood is her cardiothoracic surgeon and she is also followed by Dr. Geraldo Pitter is her primary cardiologist.  On my examination, patient reports that on 02/17/2019 [place] when she suddenly felt lightheaded and dizzy.  She felt as though she needed down her that she might pass out.  After couple of minutes, she ultimately "went limp" and lost consciousness for a period of 2 to 3 minutes.  This was witnessed by her daughter who is now at bedside.  She denies any convulsions.  Patient denied any tongue biting or tongue discomfort.  No history of seizures or seizure disorder.  There was no incontinence.    Patient has been denying any chest pain, nausea, diaphoresis, or other symptoms concerning for myocardial ischemia/infarction.  She denies any recent unilateral extremity swelling or edema.  She is taking her 81 mg aspirin, as directed.  She also denies any numbness, weakness, headache, dizziness, or other persistent neurologic deficits.  She wanted to come to the ED today because she just noticed that her heart rate has been particularly low.  She is currently bradycardic with heart rate of 46 and she states that she did not even take her metoprolol this morning.  HPI     Past Medical History:  Diagnosis Date  . Abdominal bruit 12/12/2019  .  AKI (acute kidney injury) ([place]) 10/17/2019  . Angina pectoris ([place]) 11/20/2017  . Barrett's esophagus 09/30/2019  . Coronary artery disease involving native coronary artery of native heart 12/26/2017  . Diabetes mellitus due to underlying condition with unspecified complications ([place]) 44/06/4740   Type 2, on metformin  . Essential hypertension 11/20/2017  . Ex-smoker 11/20/2017  . Glaucoma 09/30/2019  . Hyperlipidemia associated with type 2 diabetes mellitus ([place]) 10/2017  . Hypertension associated with diabetes ([place]) 11/20/2017  . Hypothyroidism 10/17/2019  . Iliotibial band syndrome of left side 09/09/2015  . Paroxysmal atrial fibrillation ([place]) 10/17/2019  . Pleural effusion on left 10/17/2019  . Renal calculi 09/30/2019    Patient Active Problem List   Diagnosis Date Noted  . Diabetes mellitus without complication ([place])   . Abdominal bruit 12/12/2019  . AKI (acute kidney injury) ([place]) 10/17/2019  . Pleural effusion on left 10/17/2019  . Hyperlipidemia associated with type 2 diabetes mellitus ([place]) 10/17/2019  . Hypothyroidism 10/17/2019  . Paroxysmal atrial fibrillation ([place]) 10/17/2019  . Diabetes mellitus due to underlying condition with unspecified complications ([place]) 59/56/3875  . Diabetes ([place]) 09/30/2019  . Glaucoma 09/30/2019  . Barrett's esophagus 09/30/2019  . Renal calculi 09/30/2019  . Palpitations 08/12/2019  . Coronary artery disease involving native coronary artery of native heart 12/26/2017  . Hyperlipidemia 11/20/2017  . Angina pectoris ([place]) 11/20/2017  . Hypertension associated with diabetes ([place]) 11/20/2017  . Ex-smoker 11/20/2017  . Essential hypertension 11/20/2017  . Iliotibial band syndrome of  left side 09/09/2015    Past Surgical History:  Procedure Laterality Date  . CHOLECYSTECTOMY    . CORONARY ARTERY BYPASS GRAFT N/A 10/02/2019   Procedure: CORONARY ARTERY BYPASS GRAFTING (CABG) TIMES TWO, ON PUMP, USING LEFT INTERNAL MAMMARY ARTERY AND RIGHT GREATER  SAPHENOUS VEIN HARVESTED ENDOSCOPICALLY;  Surgeon: [name], MD;  Location: [facility];  Service: Open Heart Surgery;  Laterality: N/A;  LIMA to LAD, SVG to OM 1  . INTRAVASCULAR PRESSURE WIRE/FFR STUDY N/A 11/23/2017   Procedure: INTRAVASCULAR PRESSURE WIRE/FFR STUDY;  Surgeon: Jettie Booze, MD;  Location: [facility];  Service: Cardiovascular;  Laterality: N/A;  . IR THORACENTESIS ASP PLEURAL SPACE W/IMG GUIDE  12/04/2019  . LEFT HEART CATH AND CORONARY ANGIOGRAPHY N/A 11/23/2017   Procedure: LEFT HEART CATH AND CORONARY ANGIOGRAPHY;  Surgeon: Jettie Booze, MD;  Location: [facility];  Service: Cardiovascular;  Laterality: N/A;  . LEFT HEART CATH AND CORONARY ANGIOGRAPHY N/A 09/30/2019   Procedure: LEFT HEART CATH AND CORONARY ANGIOGRAPHY;  Surgeon: Troy Sine, MD;  Location: [facility];  Service: Cardiovascular;  Laterality: N/A;  . MEDIASTINAL EXPLORATION N/A 10/03/2019   Procedure: MEDIASTINAL WASHOUT;  Surgeon: [name], MD;  Location: [facility];  Service: Thoracic;  Laterality: N/A;  . TEE WITHOUT CARDIOVERSION N/A 10/02/2019   Procedure: TRANSESOPHAGEAL ECHOCARDIOGRAM (TEE);  Surgeon: [name], MD;  Location: [facility];  Service: Open Heart Surgery;  Laterality: N/A;  . TUBAL LIGATION       OB History   No obstetric history on file.     Family History  Problem Relation Age of Onset  . Leukemia Mother   . Lung cancer Father   . Hypertension Sister   . Heart attack Maternal Grandmother   . Heart attack Maternal Grandfather   . Heart attack Maternal Uncle     Social History   Tobacco Use  . Smoking status: Former Research scientist (life sciences)  . Smokeless tobacco: Never Used  Vaping Use  . Vaping Use: Never used  Substance Use Topics  . Alcohol use: Not Currently  . Drug use: Never    Home Medications Prior to Admission medications   Medication Sig Start Date End Date Taking? Authorizing Provider  amiodarone (PACERONE) 200 MG tablet  Take 1 tablet (200 mg total) by mouth daily. 02/11/20  Yes Revankar, Reita Cliche, MD  aspirin EC 81 MG tablet Take 1 tablet (81 mg total) by mouth daily. 09/25/19  Yes Revankar, Reita Cliche, MD  atorvastatin (LIPITOR) 80 MG tablet Take 80 mg by mouth daily. 02/06/20  Yes [provider]  levothyroxine (SYNTHROID, LEVOTHROID) 50 MCG tablet Take 50 mcg by mouth daily before breakfast.  11/12/17  Yes [provider]  lisinopril (ZESTRIL) 20 MG tablet Take 1 tablet (20 mg total) by mouth daily. 02/11/20  Yes Revankar, Reita Cliche, MD  metFORMIN (GLUCOPHAGE) 500 MG tablet Take 500 mg by mouth 2 (two) times daily with a meal.  07/21/19  Yes [provider]  metoprolol tartrate (LOPRESSOR) 50 MG tablet Take 1 tablet (50 mg total) by mouth 2 (two) times daily. 02/11/20  Yes Revankar, Reita Cliche, MD  nitroGLYCERIN (NITROSTAT) 0.4 MG SL tablet Place 1 tablet (0.4 mg total) under the tongue every 5 (five) minutes as needed for chest pain. 02/11/20 05/11/20 Yes Revankar, Reita Cliche, MD  omeprazole (PRILOSEC) 40 MG capsule Take 1 capsule (40 mg total) by mouth daily. 02/11/20  Yes Revankar, Reita Cliche, MD  PARoxetine (  PAXIL) 10 MG tablet Take 10 mg by mouth daily. 06/29/19  Yes [provider]  Vitamin D, Ergocalciferol, (DRISDOL) 1.25 MG (50000 UNIT) CAPS capsule Take 50,000 Units by mouth once a week. 02/06/20  Yes [provider]  acetaminophen (TYLENOL) 500 MG tablet Take 2 tablets (1,000 mg total) by mouth every 6 (six) hours as needed. Patient not taking: Reported on 02/19/2020 10/07/19   Barrett, Lodema Hong, PA-C  atorvastatin (LIPITOR) 40 MG tablet Take 1 tablet (40 mg total) by mouth at bedtime. 02/11/20   Revankar, Reita Cliche, MD    Allergies    Patient has no known allergies.  Review of Systems   Review of Systems  All other systems reviewed and are negative.   Physical Exam Updated Vital Signs BP (!) 161/69 (BP Location: Right Arm)   Pulse (!) 47   Temp (!) 97.1 F (36.2 C) (Oral)    Resp 14   Ht 5\' 6"  (1.676 m)   Wt 66.2 kg   SpO2 99%   BMI 23.56 kg/m   Physical Exam Vitals and nursing note reviewed. Exam conducted with a chaperone present.  HENT:     Head: Normocephalic and atraumatic.  Eyes:     General: No scleral icterus.    Conjunctiva/sclera: Conjunctivae normal.  Cardiovascular:     Rate and Rhythm: Normal rate and regular rhythm.     Pulses: Normal pulses.  Pulmonary:     Effort: Pulmonary effort is normal. No respiratory distress.     Breath sounds: Normal breath sounds.  Musculoskeletal:     Right lower leg: No edema.     Left lower leg: No edema.  Skin:    General: Skin is dry.     Capillary Refill: Capillary refill takes less than 2 seconds.  Neurological:     Mental Status: She is alert.     GCS: GCS eye subscore is 4. GCS verbal subscore is 5. GCS motor subscore is 6.  Psychiatric:        Mood and Affect: Mood normal.        Behavior: Behavior normal.        Thought Content: Thought content normal.      ED Results / Procedures / Treatments   Labs (all labs ordered are listed, but only abnormal results are displayed) Labs Reviewed  BASIC METABOLIC PANEL - Abnormal; Notable for the following components:      Result Value   Potassium 5.8 (*)    Glucose, Bld 184 (*)    Creatinine, Ser 1.15 (*)    GFR, Estimated 51 (*)    All other components within normal limits  URINALYSIS, ROUTINE W REFLEX MICROSCOPIC - Abnormal; Notable for the following components:   Color, Urine STRAW (*)    Leukocytes,Ua TRACE (*)    All other components within normal limits  CBG MONITORING, ED - Abnormal; Notable for the following components:   Glucose-Capillary 161 (*)    All other components within normal limits  CBC  TROPONIN I (HIGH SENSITIVITY)  TROPONIN I (HIGH SENSITIVITY)    EKG EKG Interpretation  Date/Time:  Thursday February 19 2020 12:10:51 EDT Ventricular Rate:  46 PR Interval:  166 QRS Duration: 88 QT Interval:  484 QTC  Calculation: 423 R Axis:   8 Text Interpretation: Sinus bradycardia Cannot rule out Anterior infarct , age undetermined Abnormal ECG No STEMI Confirmed by Octaviano Glow [phone]) on 02/19/2020 12:31:41 PM   Radiology CT HEAD WO CONTRAST  Result Date: 02/19/2020  CLINICAL DATA:  Syncope. EXAM: CT HEAD WITHOUT CONTRAST TECHNIQUE: Contiguous axial images were obtained from the base of the skull through the vertex without intravenous contrast. COMPARISON:  CT head 11/11/2017 FINDINGS: Brain: Hypodensities within the left caudate head and right thalamus, favored remote. Mild ex vacuo dilation of the left frontal horn. No evidence of acute large vascular territory infarct. No acute hemorrhage. No hydrocephalus. Mild diffuse cerebral atrophy with ex vacuo ventricular dilation. No mass lesion or abnormal mass effect. Vascular: Calcific atherosclerosis. Skull: No acute fracture. Sinuses/Orbits: Visualized sinuses are clear.  Unremarkable orbits. Other: No mastoid effusion. 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 If there is concern for acute infarction then MRI could better evaluate. Electronically Signed   By: Margaretha Sheffield MD   On: 02/19/2020 13:43    Procedures Procedures (including critical care time)  Medications Ordered in ED Medications - No data to display  ED Course  I have reviewed the triage vital signs and the nursing notes.  Pertinent labs & imaging results that were available during my care of the patient were reviewed by me and considered in my medical decision making (see chart for details).  Clinical Course as of Feb 19 1603  Thu Feb 19, 2020  1508 Spoke with Wannetta Sender, cardiology master, who will have cardiology come see patient   [GG]  1600 This is a 69 year old female presenting to emergency department with 2 episodes of syncope.  Most recent occurred 2 days ago.  She syncopized with lightheaded prodrome  prior to that.  Here she is well-appearing and asymptomatic.  She is found to be bradycardic with HR 40's on monitor, NSR.  Blood pressure stable.  Labs are notable for some mild hyperkalemia -it is unclear if this is iatrogenic or caused by her medications.  She has no evidence of heart block on her EKG.  Primarily concerned about symptomatic bradycardia secondary to her medications that she is on metoprolol and amiodarone.  She does also have a significant coronary vascular history with a CABG earlier this year.  We will have cardiology consult and offer recommendations regarding need for further observation or advanced imaging/echo.   [MT]    Clinical Course User Index [GG] Corena Herter, PA-C [MT] Langston Masker Carola Rhine, MD   MDM Rules/Calculators/A&P                          Labs CBC: No leukocytosis concerning for infection. No anemia. BMP: Hyperkalemia 5.8. CKD consistent with baseline. Troponin: 7 >>   CT head without contrast is personally reviewed and demonstrates lacunar infarcts in the left caudate and right thalamus which are new since 2009, but remote. Lower suspicion for acute CVA. She is denying any neurologic deficits.  EKG with sinus bradycardia, 46 bpm.    Patient is on both amiodarone and metoprolol for her hypertension.  I spoke with cardiology who will see her here in the ED. If they do not want to admit her, we will at least need to address her elevated potassium.  Patient also has evidence of remote lacunar infarcts, new since 2019.  Informed patient.  Cardiology now at bedside.    At shift change care was transferred to Margarita Mail, PA-C who will follow pending studies, re-evaluate, and determine disposition.     Final Clinical Impression(s) / ED Diagnoses Final diagnoses:  Syncope, unspecified syncope type  Hyperkalemia  Rx / DC Orders ED Discharge Orders    None       Corena Herter, PA-C 02/19/20 1604    Corena Herter, PA-C 02/19/20  1610    Wyvonnia Dusky, MD 02/19/20 1726

## 2020-02-19 NOTE — ED Provider Notes (Signed)
69 year old female here with syncope.  She is taken in signout at shift handoff from [employer].  This is her second episode of syncope 1 back in August and 1 2 days ago.  She has a prodrome prior to loss of consciousness.  Patient currently on metoprolol.  She is bradycardic.  Awaiting cardiology consult.   Cardiology is consulted on the patient and will admit for further work-up of her syncope.   Margarita Mail, PA-C 02/19/20 2036    Lajean Saver, MD 02/19/20 [phone]

## 2020-02-19 NOTE — Telephone Encounter (Signed)
Pt c/o Syncope: STAT if syncope occurred within 30 minutes and pt complains of lightheadedness High Priority if episode of passing out, completely, today or in last 24 hours   1. Did you pass out today? No  2. When is the last time you passed out? 02/17/20  3. Has this occurred multiple times? Twice   4. Did you have any symptoms prior to passing out? lightheadedness

## 2020-02-20 DIAGNOSIS — Z20822 Contact with and (suspected) exposure to covid-19: Secondary | ICD-10-CM | POA: Diagnosis not present

## 2020-02-20 DIAGNOSIS — I25119 Atherosclerotic heart disease of native coronary artery with unspecified angina pectoris: Secondary | ICD-10-CM | POA: Diagnosis not present

## 2020-02-20 DIAGNOSIS — E875 Hyperkalemia: Secondary | ICD-10-CM | POA: Diagnosis not present

## 2020-02-20 DIAGNOSIS — I10 Essential (primary) hypertension: Secondary | ICD-10-CM | POA: Diagnosis not present

## 2020-02-20 DIAGNOSIS — R55 Syncope and collapse: Secondary | ICD-10-CM | POA: Diagnosis not present

## 2020-02-20 LAB — COMPREHENSIVE METABOLIC PANEL
ALT: 11 U/L (ref 0–44)
AST: 11 U/L — ABNORMAL LOW (ref 15–41)
Albumin: 3.2 g/dL — ABNORMAL LOW (ref 3.5–5.0)
Alkaline Phosphatase: 87 U/L (ref 38–126)
Anion gap: 8 (ref 5–15)
BUN: 15 mg/dL (ref 8–23)
CO2: 28 mmol/L (ref 22–32)
Calcium: 9.1 mg/dL (ref 8.9–10.3)
Chloride: 103 mmol/L (ref 98–111)
Creatinine, Ser: 1.32 mg/dL — ABNORMAL HIGH (ref 0.44–1.00)
GFR, Estimated: 43 mL/min — ABNORMAL LOW (ref >60)
Glucose, Bld: 129 mg/dL — ABNORMAL HIGH (ref 70–99)
Potassium: 4.1 mmol/L (ref 3.5–5.1)
Sodium: 139 mmol/L (ref 135–145)
Total Bilirubin: 0.5 mg/dL (ref 0.3–1.2)
Total Protein: 6 g/dL — ABNORMAL LOW (ref 6.5–8.1)

## 2020-02-20 LAB — MAGNESIUM: Magnesium: 1.8 mg/dL (ref 1.7–2.4)

## 2020-02-20 MED ORDER — ADULT MULTIVITAMIN W/MINERALS CH
1.0000 | ORAL_TABLET | Freq: Every day | ORAL | Status: DC
  Administered 2020-02-20: 1 via ORAL
  Filled 2020-02-20: qty 1

## 2020-02-20 MED ORDER — AMLODIPINE BESYLATE 10 MG PO TABS: 10.0000 mg | ORAL_TABLET | Freq: Every day | ORAL | Status: DC

## 2020-02-20 MED ORDER — HYDRALAZINE HCL 50 MG PO TABS: 50.0000 mg | ORAL_TABLET | Freq: Three times a day (TID) | ORAL | 6 refills | Status: DC

## 2020-02-20 MED ORDER — AMLODIPINE BESYLATE 10 MG PO TABS: 10.0000 mg | ORAL_TABLET | Freq: Every day | ORAL | 6 refills | Status: DC

## 2020-02-20 MED ORDER — ENSURE ENLIVE PO LIQD
237.0000 mL | Freq: Two times a day (BID) | ORAL | Status: DC
  Administered 2020-02-20 (×2): 237 mL via ORAL

## 2020-02-20 MED ORDER — LISINOPRIL 20 MG PO TABS
20.0000 mg | ORAL_TABLET | Freq: Every day | ORAL | Status: DC
  Administered 2020-02-20: 20 mg via ORAL
  Filled 2020-02-20: qty 1

## 2020-02-20 MED ORDER — AMLODIPINE BESYLATE 5 MG PO TABS
5.0000 mg | ORAL_TABLET | Freq: Once | ORAL | Status: AC
  Administered 2020-02-20: 5 mg via ORAL
  Filled 2020-02-20: qty 1

## 2020-02-20 NOTE — Discharge Summary (Signed)
Discharge Summary    Patient ID: Courtney Gardner,  MRN: [id number], DOB/AGE: 1951-01-07 69 y.o.  Admit date: 02/19/2020 Discharge date: 02/20/2020  Primary Care Provider: [facility] Cardiologist: Jenean Lindau, MD  Discharge Diagnoses    Principal Problem:   Syncope Active Problems:   Diabetes mellitus due to underlying condition with unspecified complications [facility])   Hypothyroidism   Essential hypertension   Lacunar infarction ([place])   Allergies No Known Allergies  Diagnostic Studies/Procedures    None this admission _____________   History of Present Illness     Courtney Gardner is a 69 y.o. female with a hx of CABG 10/02/2019 w/ LIMA-LAD, SVG-OM1, DM, HTN, HLD, PAF, Hypothyroid, remote tob use, abd bruit, pleural effusion postop requiring thoracentesis 12/04/2019 of 1 L, who was admitted to cardiology for evaluation of syncope.   Prior to this she was discharged 6/9 after her bypass surgery on amiodarone 200 mg daily and Lopressor 50 mg twice daily (among other meds), heart rate 50s at times.  Admitted 6/18-6/20/2021 for AKI w/ Cr 1.42, and Na+ 128, Cl 87, pleural effusion noted, patient felt volume depleted, sodium 128 and creatinine 1.42, patient having problems with diarrhea at that time, Lopressor held due to hypotension.  Courtney Gardner had a syncopal episode about a month ago.  She got up from a chair walked into the kitchen.  She woke up on the floor.  Family was not present, her loss of consciousness was probably very brief.  She remembers feeling dizzy after she stood up from the chair.  She denies any kind of palpitations, shortness of breath, or chest pain.  Symptoms resolved and she would feel a little lightheaded after standing up upon occasion, but no presyncope.  2 days ago, she and her daughter were out at the barn and she was doing some light work.  She felt tired and sat down to rest.  Her family was helping her back to the house,  when she lost consciousness.  She remembers feeling lightheaded and weak.  She had not been following her blood pressure.  Right after the surgery, her systolic blood pressure ranged from 123-147.  After the syncopal episode, they charged up the blood pressure cuff and found out that her heart rate was in the 17O, and her systolic blood pressure was very elevated.  Currently, in the emergency room, her heart rate is in the mid 40s and she is asymptomatic.  She has not worn a heart monitor.    Hospital Course     Consultants: None   1. Syncope: Likely orthostatic vs chronotropic incompetence in the setting of  amiodarone use. Low suspicion of malignant arrhythmia or ischemic etiology given recent revascularization and lack of exertional symptoms and reassuring ECG. Amiodarone discontinued. Orthostatics positive this AM but patient asymptomatic given elevated baseline blood pressure. HR augmenting appropriately with positional changes - Hold nodal agents including BB and amiodarone - Encouraged increased PO intake of fluids and regular meals (has poor PO intake at home) - Encouraged compression stockings to prevent venous pooling - Order placed for a 30-day outpatient cardiac monitor to further evaluate for arrhythmias  2. HTN: Poorly controlled. She was started on amlodipine and uptitrated to 10mg . Additionally started on hydralazine 50mg  TID. Home lisinopril was continued. Other options limited (no HCTZ due to orthostasis, no BB due to bradycardia, was holding spironolactone due to hyper K but may be added as out-patient pending repeat labs on home lisinopril).  Notably she is orthostatic so want to be cautious with BP up-titration, however, SBPs 170-200s are too high. - Continue amlodipine, hydralazine, and lisinopril - Will arrange for follow-up in our hypertension clinic for close surveillance. - Patient instructed to keep a log of her blood pressures to bring to her follow-up visits.   3.  Hyperkalemia: Resolved and back to 4.1 on the day of discharge. Home lisinopril initially held, then resumed on the day of discharge - Would re-check BMET in 1 week.  4. 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 Stable without chest pain. - Continue ASA and statin - No BB given bradycardia  5. Post-operative Afib: Presenting with syncope with HR 40s. No Afib on monitor. - Will arrange a 30-day monitor given syncope - Hold BB and amiodarone given bradycardia and syncope - Not on AC _____________  Discharge Vitals Blood pressure (!) 154/64, pulse (!) 58, temperature 98.5 F (36.9 C), temperature source Oral, resp. rate 16, height 5\' 6"  (1.676 m), weight 64.9 kg, SpO2 98 %.  Filed Weights   02/19/20 1200 02/20/20 0513  Weight: 66.2 kg 64.9 kg    Labs & Radiologic Studies    CBC Recent Labs    02/19/20 1211  WBC 8.6  HGB 12.0  HCT 38.3  MCV 93.6  PLT 540   Basic Metabolic Panel Recent Labs    02/19/20 1211 02/19/20 1211 02/19/20 1744 02/20/20 0138  NA 135  --   --  139  K 5.8*   < > 5.2* 4.1  CL 100  --   --  103  CO2 27  --   --  28  GLUCOSE 184*  --   --  129*  BUN 17  --   --  15  CREATININE 1.15*  --   --  1.32*  CALCIUM 9.3  --   --  9.1  MG  --   --   --  1.8   < > = values in this interval not displayed.   Liver Function Tests Recent Labs    02/20/20 0138  AST 11*  ALT 11  ALKPHOS 87  BILITOT 0.5  PROT 6.0*  ALBUMIN 3.2*   No results for input(s): LIPASE, AMYLASE in the last 72 hours. Cardiac Enzymes No results for input(s): CKTOTAL, CKMB, CKMBINDEX, TROPONINI in the last 72 hours. BNP Invalid input(s): POCBNP D-Dimer No results for input(s): DDIMER in the last 72 hours. Hemoglobin A1C No results for input(s): HGBA1C in the last 72 hours. Fasting Lipid Panel No results for input(s): CHOL, HDL, LDLCALC, TRIG, CHOLHDL, LDLDIRECT in the last 72 hours. Thyroid Function Tests Recent Labs    02/19/20 1744  TSH 1.111    _____________  DG Chest 2 View  Result Date: 02/19/2020 CLINICAL DATA:  Syncope. EXAM: CHEST - 2 VIEW COMPARISON:  December 04, 2019 FINDINGS: Multiple sternal wires and vascular clips are seen. Mild areas of linear atelectasis and/or infiltrate are seen within the left lung base. This is decreased in severity when compared to the prior study. A small left pleural effusion is also noted. This is also decreased in size. No pneumothorax is identified. The heart size and mediastinal contours are within normal limits. Radiopaque surgical clips are seen overlying the right upper quadrant. The visualized skeletal structures are unremarkable. IMPRESSION: 1. Mild left basilar linear atelectasis and/or infiltrate. 2. Small left pleural effusion. Electronically Signed   By: Virgina Norfolk M.D.   On: 02/19/2020 16:54   CT  HEAD WO CONTRAST  Result Date: 02/19/2020 CLINICAL DATA:  Syncope. EXAM: CT HEAD WITHOUT CONTRAST TECHNIQUE: Contiguous axial images were obtained from the base of the skull through the vertex without intravenous contrast. COMPARISON:  CT head 11/11/2017 FINDINGS: Brain: Hypodensities within the left caudate head and right thalamus, favored remote. Mild ex vacuo dilation of the left frontal horn. No evidence of acute large vascular territory infarct. No acute hemorrhage. No hydrocephalus. Mild diffuse cerebral atrophy with ex vacuo ventricular dilation. No mass lesion or abnormal mass effect. Vascular: Calcific atherosclerosis. Skull: No acute fracture. Sinuses/Orbits: Visualized sinuses are clear.  Unremarkable orbits. Other: No mastoid effusion. 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 If there is concern for acute infarction then MRI could better evaluate. Electronically Signed   By: Margaretha Sheffield MD   On: 02/19/2020 13:43   Disposition   Patient was seen and examined by Dr. Johney Frame who deemed patient as  stable for discharge. Follow-up has been arranged. Discharge medications as listed below.   Follow-up Plans & Appointments     Follow-up Information    Revankar, Reita Cliche, MD Follow up on 02/26/2020.   Specialty: Cardiology Why: Please arrive 15 minutes early for your 3:00pm post-hospital cardiology appointment.  Contact information: [place] [phone]        Skeet Latch, MD Follow up on 03/01/2020.   Specialty: Cardiology Why: Please arrive 15 minutes early for your 2pm HYPERTENSION CLINIC appointment with Dr. Oval Linsey. Dr. Oval Linsey is a collegue of Dr. Enos Fling who specializes in difficult to manage high blood pressure.  Contact information: [address] [place] [address] [phone]        [place] [facility] [place] Follow up.   Specialty: Cardiology Why: Our office will reach out to your to arrange an outpatient cardiac monitor to further evaluate why you passed out.  Contact information: [place] [address] [phone]             Discharge Instructions    Diet - low sodium heart healthy   Complete by: As directed    Increase activity slowly   Complete by: As directed       Discharge Medications   Allergies as of 02/20/2020   No Known Allergies     Medication List    STOP taking these medications   amiodarone 200 MG tablet Commonly known as: PACERONE     TAKE these medications   acetaminophen 500 MG tablet Commonly known as: TYLENOL Take 2 tablets (1,000 mg total) by mouth every 6 (six) hours as needed.   amLODipine 10 MG tablet Commonly known as: NORVASC Take 1 tablet (10 mg total) by mouth daily. Start taking on: February 21, 2020   aspirin EC 81 MG tablet Take 1 tablet (81 mg total) by mouth daily.   atorvastatin 80 MG tablet Commonly known as: LIPITOR Take 80 mg by mouth daily. What changed: Another medication with the same name was removed.  Continue taking this medication, and follow the directions you see here.   hydrALAZINE 50 MG tablet Commonly known as: APRESOLINE Take 1 tablet (50 mg total) by mouth every 8 (eight) hours.   levothyroxine 50 MCG tablet Commonly known as: SYNTHROID Take 50 mcg by mouth daily before breakfast.   lisinopril 20 MG tablet Commonly known as: ZESTRIL Take 1 tablet (20 mg total) by mouth daily.   metFORMIN 500  MG tablet Commonly known as: GLUCOPHAGE Take 500 mg by mouth 2 (two) times daily with a meal.   nitroGLYCERIN 0.4 MG SL tablet Commonly known as: NITROSTAT Place 1 tablet (0.4 mg total) under the tongue every 5 (five) minutes as needed for chest pain.   omeprazole 40 MG capsule Commonly known as: PRILOSEC Take 1 capsule (40 mg total) by mouth daily.   PARoxetine 10 MG tablet Commonly known as: PAXIL Take 10 mg by mouth daily.   Vitamin D (Ergocalciferol) 1.25 MG (50000 UNIT) Caps capsule Commonly known as: DRISDOL Take 50,000 Units by mouth once a week.        Outstanding Labs/Studies   Check BMET at follow-up for close monitoring of kidney function and electrolytes.  Duration of Discharge Encounter   Greater than 30 minutes including physician time.  Signed, Abigail Butts PA-C 02/20/2020, 3:57 PM

## 2020-02-20 NOTE — Progress Notes (Signed)
Progress Note  Patient Name: Courtney Gardner Date of Encounter: 02/20/2020  [facility] Cardiologist: Jenean Lindau, MD   Subjective  Feels well. Denies dizziness while standing. Hoping to go home today  Orthostatics positive this morning but asymptomatic 210/69 laying-->184/89 sitting-->159/86 standing-->196/89 standing after 3 min Notably HR increased from 55-->76 with positional changes after holding nodal agents  BP poorly controlled 160-200s Inpatient Medications    Scheduled Meds: . amLODipine  5 mg Oral Daily  . aspirin EC  81 mg Oral Daily  . atorvastatin  80 mg Oral Daily  . enoxaparin (LOVENOX) injection  40 mg Subcutaneous Q24H  . feeding supplement  237 mL Oral BID BM  . hydrALAZINE  50 mg Oral Q8H  . levothyroxine  50 mcg Oral QAC breakfast  . metFORMIN  500 mg Oral BID WC  . pantoprazole  40 mg Oral Daily  . PARoxetine  10 mg Oral Daily  . sodium chloride flush  3 mL Intravenous Q12H   Continuous Infusions: . sodium chloride     PRN Meds: sodium chloride, acetaminophen, ALPRAZolam, nitroGLYCERIN, ondansetron (ZOFRAN) IV, sodium chloride flush, zolpidem   Vital Signs    Vitals:   02/19/20 1800 02/19/20 2007 02/20/20 0004 02/20/20 0513  BP: (!) 174/64 (!) 166/66 (!) 170/67 (!) 165/92  Pulse: (!) 55 (!) 57 (!) 50 (!) 50  Resp: 14 16 20 16   Temp: 98.2 F (36.8 C) 97.7 F (36.5 C) (!) 97.5 F (36.4 C) 97.8 F (36.6 C)  TempSrc:  Oral Oral Oral  SpO2: 98% 98% 98% 98%  Weight:    64.9 kg  Height:        Intake/Output Summary (Last 24 hours) at 02/20/2020 0815 Last data filed at 02/20/2020 0718 Gross per 24 hour  Intake 360 ml  Output 950 ml  Net -590 ml   Last 3 Weights 02/20/2020 02/19/2020 12/12/2019  Weight (lbs) 143 lb 145 lb 15.1 oz 146 lb  Weight (kg) 64.864 kg 66.2 kg 66.225 kg      Telemetry    Sinus bradycardia with HR 40-50s - Personally Reviewed  ECG    No new ECG - Personally Reviewed  Physical Exam   GEN: No acute  distress.   Neck: No JVD Cardiac: RRR, no murmurs, rubs, or gallops.  Respiratory: Clear to auscultation bilaterally. GI: Soft, nontender, non-distended  MS: No edema; No deformity. Neuro:  Nonfocal  Psych: Normal affect   Labs    High Sensitivity Troponin:   Recent Labs  Lab 02/19/20 1211 02/19/20 1516  TROPONINIHS 7 8      Chemistry Recent Labs  Lab 02/19/20 1211 02/19/20 1744 02/20/20 0138  NA 135  --  139  K 5.8* 5.2* 4.1  CL 100  --  103  CO2 27  --  28  GLUCOSE 184*  --  129*  BUN 17  --  15  CREATININE 1.15*  --  1.32*  CALCIUM 9.3  --  9.1  PROT  --   --  6.0*  ALBUMIN  --   --  3.2*  AST  --   --  11*  ALT  --   --  11  ALKPHOS  --   --  87  BILITOT  --   --  0.5  GFRNONAA 51*  --  43*  ANIONGAP 8  --  8     Hematology Recent Labs  Lab 02/19/20 1211  WBC 8.6  RBC 4.09  HGB 12.0  HCT 38.3  MCV 93.6  MCH 29.3  MCHC 31.3  RDW 14.4  PLT 383    BNPNo results for input(s): BNP, PROBNP in the last 168 hours.   DDimer No results for input(s): DDIMER in the last 168 hours.   Radiology    DG Chest 2 View  Result Date: 02/19/2020 CLINICAL DATA:  Syncope. EXAM: CHEST - 2 VIEW COMPARISON:  December 04, 2019 FINDINGS: Multiple sternal wires and vascular clips are seen. Mild areas of linear atelectasis and/or infiltrate are seen within the left lung base. This is decreased in severity when compared to the prior study. A small left pleural effusion is also noted. This is also decreased in size. No pneumothorax is identified. The heart size and mediastinal contours are within normal limits. Radiopaque surgical clips are seen overlying the right upper quadrant. The visualized skeletal structures are unremarkable. IMPRESSION: 1. Mild left basilar linear atelectasis and/or infiltrate. 2. Small left pleural effusion. Electronically Signed   By: Virgina Norfolk M.D.   On: 02/19/2020 16:54   CT HEAD WO CONTRAST  Result Date: 02/19/2020 CLINICAL DATA:  Syncope.  EXAM: CT HEAD WITHOUT CONTRAST TECHNIQUE: Contiguous axial images were obtained from the base of the skull through the vertex without intravenous contrast. COMPARISON:  CT head 11/11/2017 FINDINGS: Brain: Hypodensities within the left caudate head and right thalamus, favored remote. Mild ex vacuo dilation of the left frontal horn. No evidence of acute large vascular territory infarct. No acute hemorrhage. No hydrocephalus. Mild diffuse cerebral atrophy with ex vacuo ventricular dilation. No mass lesion or abnormal mass effect. Vascular: Calcific atherosclerosis. Skull: No acute fracture. Sinuses/Orbits: Visualized sinuses are clear.  Unremarkable orbits. Other: No mastoid effusion. 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 If there is concern for acute infarction then MRI could better evaluate. Electronically Signed   By: Margaretha Sheffield MD   On: 02/19/2020 13:43    Cardiac Studies   ABD [place]: 01/01/2020 Summary:  Renal:    Right: Normal size right kidney. 1-59% stenosis of the right renal     artery. lower range of stenosis.  Left: Normal size of left kidney. 1-59% stenosis of the left renal     artery. lower range of stenosis.  Mesenteric:  Normal Celiac artery and Superior Mesenteric artery findings.     ECHO: 09/11/2019 1. Left ventricular ejection fraction, by estimation, is 50 to 55%. The  left ventricle has low normal function. The left ventricle has no regional  wall motion abnormalities. Left ventricular diastolic parameters are  indeterminate.  2. Right ventricular systolic function is normal. The right ventricular  size is normal.  3. The mitral valve is normal in structure. Trivial mitral valve  regurgitation. No evidence of mitral stenosis.  4. The aortic valve is normal in structure. Aortic valve regurgitation is  not visualized. No aortic stenosis is present.  5. The inferior vena  cava is normal in size with greater than 50%  respiratory variability, suggesting right atrial pressure of 3 mmHg.   TEE INTRA-OP: 10/02/2019 PRE-OP FINDINGS  Left Ventricle: The left ventricle has normal systolic function, with an  ejection fraction of 60-65%. The cavity size was normal. There is  moderately increased left ventricular wall thickness. No evidence of left  ventricular regional wall motion  abnormalities. LV wall thickness 10 mm. E/A was 0.74 m/s. DT 215 ms.  Suggesting Stage I diastolic disease.   Right Ventricle: The right ventricle  has normal systolic function. The  cavity was normal. There is no increase in right ventricular wall  thickness.   Left Atrium: Left atrial size was normal in size.   Right Atrium: Right atrial size was normal in size. Right atrial pressure  is estimated at 10 mmHg.   Interatrial Septum: No atrial level shunt detected by color flow Doppler.   Pericardium: There is no evidence of pericardial effusion.   Mitral Valve: The mitral valve is normal in structure. Mitral valve  regurgitation is not visualized by color flow Doppler.   Tricuspid Valve: The tricuspid valve was normal in structure. Tricuspid  valve regurgitation was not visualized by color flow Doppler.   Aortic Valve: The aortic valve is tricuspid Aortic valve regurgitation was  not visualized by color flow Doppler. There is no evidence of aortic valve  stenosis.   Pulmonic Valve: The pulmonic valve was normal in structure.  Pulmonic valve regurgitation is trivial by color flow Doppler.    Aorta: The aortic root, ascending aorta and aortic arch are normal in size  and structure.   CATH: 09/30/2019  Prox RCA lesion is 30% stenosed.  Ost LM to Dist LM lesion is 75% stenosed.  Mid Cx to Dist Cx lesion is 40% stenosed.  1st Mrg lesion is 65% stenosed.  Prox LAD lesion is 70% stenosed.  The left ventricular systolic function is normal.  LV end diastolic pressure  is normal.  The left ventricular ejection fraction is 55-65% by visual estimate.  Significant coronary calcification involving the left main with catheter dampening with each engagement and stenosis of 75%, calcification of the proximal LAD with narrowing up to 70%; 70% stenoses in the circumflex marginal vessel with mild 40% mid stenosis and a dominant left circumflex vessel and nondominant RCA with 30% proximal stenosis.  Normal LV function with EF estimated at 60 to 65%. LVEDP 14 mm.  Definitely hypertensive throughout the catheterization; IV nitroglycerin was started at the end of the procedure.  RECOMMENDATION: Angiograms were reviewed with colleagues who agreed with the angiographic significance of the left main. Will admit patient to the hospital. Titrate IV nitroglycerin. Adjunctive medical therapy for CAD. Surgical consultation for CABG revascularization. Aggressive lipid intervention.   Patient Profile     69 y.o. female with a history of CABG 10/02/2019 w/ LIMA-LAD, SVG-OM1, DM, HTN, HLD, PAF, Hypothyroid, remote tob use, abd bruit, pleural effusion postop requiring thoracentesis 12/04/2019 of 1 L, who presented with syncope likely due to orthostasis vs chronotropic incompetence in the setting of BB and amiodarone use.   Assessment & Plan    #Syncope: Likely orthostatic vs chronotropic incompetence in the setting of BB and amiodarone use. Low suspicion of malignant arrhythmia or ischemic etiology given recent revascularization and lack of exertional symptoms and reassuring ECG. Holding nodal agents. -Orthostatics positive this AM but patient asymptomatic -HR now augmenting appropriately with positional changes; plan to ambulate with nursing to ensure augments with acitvity -Hold nodal agents including BB and amiodarone -Will need increased PO intake of fluids and regular meals (has poor PO intake at home) -Compression stockings to prevent venous pooling -Will send with  out-patient monitor   #HTN: Poorly controlled.  -Up-titrate amlodipine to 10mg  daily -Can resume home lisinopril 20mg  daily -May need additional BP control with hydralazine as other options limited (no HCTZ due to orthostasis, no BB due to bradycardia, was holding spironolactone due to hyper K but may be added as out-patient pending repeat labs on home lisinopril) -Notably she  is orthostatic so want to be cautious with BP up-titration, however, SBPs 170-200s are too high. Will arrange for follow-up in our hypertension clinic for close surveillance.  #Hyperkalemia: Resolved and back to 4.1 this AM. -Will resume lisinopril as patient tolerated without issues in the past and may have been dehydrated when she came in. Needs significant blood pressure control so will resume and re-check BMET in 1 week.  #CAD s/p ACB: S/p ACB on6/06/2019 with LIMA-LAD, SVG-OM1. Stable without chest pain. -Continue ASA and statin -No BB given bradycardia  #Post-operative Afib:  Presenting with syncope with HR 40s. No Afib on monitor. -Will send home with long-term monitor given syncope -Hold BB and amiodarone given bradycardia and syncope -Not on AC  If SBP improves and is <180, can go home today on current medication regimen with very close follow-up with hypertension clinic and primary cardiologist.   For questions or updates, please contact [place] Please consult [email or web address] for contact info under     Signed, Freada Bergeron, MD  02/20/2020, 8:15 AM

## 2020-02-20 NOTE — Progress Notes (Signed)
   02/20/20 0851  Assess: MEWS Score  Temp 98.6 F (37 C)  BP (!) 203/61  Pulse Rate (!) 54  ECG Heart Rate (!) 57  Resp 20  SpO2 98 %  O2 Device Room Air  Assess: MEWS Score  MEWS Temp 0  MEWS Systolic 2  MEWS Pulse 0  MEWS RR 0  MEWS LOC 0  MEWS Score 2  MEWS Score Color Yellow  Assess: if the MEWS score is Yellow or Red  Were vital signs taken at a resting state? Yes  Focused Assessment No change from prior assessment  Early Detection of Sepsis Score *See Row Information* Low  MEWS guidelines implemented *See Row Information* Yes  Treat  MEWS Interventions Administered scheduled meds/treatments;Escalated (See documentation below)  Take Vital Signs  Increase Vital Sign Frequency  Yellow: Q 2hr X 2 then Q 4hr X 2, if remains yellow, continue Q 4hrs  Escalate  MEWS: Escalate Yellow: discuss with charge nurse/RN and consider discussing with provider and RRT  Notify: Charge Nurse/RN  Name of Charge Nurse/RN Notified Christy RN   Date Charge Nurse/RN Notified 02/20/20  Time Charge Nurse/RN Notified 0901  Document  Patient Outcome Stabilized after interventions  Progress note created (see row info) Yes

## 2020-02-20 NOTE — Progress Notes (Signed)
   02/20/20 0746  Orthostatic Lying   BP- Lying (!) 210/69  Pulse- Lying 55  Orthostatic Sitting  BP- Sitting 184/89  Pulse- Sitting 59  Orthostatic Standing at 0 minutes  BP- Standing at 0 minutes 159/86  Pulse- Standing at 0 minutes 65  Orthostatic Standing at 3 minutes  BP- Standing at 3 minutes 196/89  Pulse- Standing at 3 minutes 76

## 2020-02-20 NOTE — Discharge Instructions (Signed)
Please monitor your blood pressure 2 times per day and keep a log. Bring your blood pressure log to you follow-up visits with BOTH Dr. Geraldo Pitter AND Dr. Oval Linsey. This will help determine if any further medication adjustments need to be made.   We have ordered an outpatient heart monitor for you to wear for 1 month to determine if you're having any abnormal heart rhythms that might explain why you passed out. Our office will contact you to discuss this futher.   Medication changes: - STOP amiodarone - possible this caused your heart rate to be too slow and may have contributed to you passing out - START amlodipine 10mg  daily for improved blood pressure - START hydralazine 50mg  3 times per day for improved blood pressure  Please drink plenty of fluids (goal 8- 8oz cups per day) and avoid skipping meals  You can wear compression stockings to minimize lightheadedness with position changes.

## 2020-02-20 NOTE — Progress Notes (Signed)
Initial Nutrition Assessment  DOCUMENTATION CODES:   Not applicable  INTERVENTION:    Ensure Enlive po BID, each supplement provides 350 kcal and 20 grams of protein  MVI with minerals daily  NUTRITION DIAGNOSIS:   Inadequate oral intake related to decreased appetite as evidenced by percent weight loss (8% weight loss within 6 months).  GOAL:   Patient will meet greater than or equal to 90% of their needs  MONITOR:   PO intake, Supplement acceptance  REASON FOR ASSESSMENT:   Malnutrition Screening Tool    ASSESSMENT:   69 yo female admitted with syncope. PMH includes CABG, DM, HTN, HLD, PAF, hypothyroidism, remote tobacco abuse.    Patient has had 8% weight loss within the past 6 months, which is not significant for the time frame. Per chart review, she has poor intake at home.   Patient is on a CHO modified diet. She ate 75% of dinner yesterday. No other meal intakes recorded. She was given an Ensure supplement this morning.   Labs reviewed.  CBG: 161 yesterday  Medications reviewed and include Glucophage.  NUTRITION - FOCUSED PHYSICAL EXAM:  unable to complete  Diet Order:   Diet Order            Diet Carb Modified Fluid consistency: Thin; Room service appropriate? No  Diet effective now                 EDUCATION NEEDS:   Not appropriate for education at this time  Skin:  Skin Assessment: Reviewed RN Assessment  Last BM:  10/21  Height:   Ht Readings from Last 1 Encounters:  02/19/20 5\' 6"  (1.676 m)    Weight:   Wt Readings from Last 1 Encounters:  02/20/20 64.9 kg    Ideal Body Weight:  59.1 kg  BMI:  Body mass index is 23.08 kg/m.  Estimated Nutritional Needs:   Kcal:  1600-1800  Protein:  75-90 gm  Fluid:  1.6-1.8 L    Lucas Mallow, RD, LDN, CNSC Please refer to Amion for contact information.

## 2020-02-26 ENCOUNTER — Ambulatory Visit: Payer: Medicare Other

## 2020-02-26 ENCOUNTER — Other Ambulatory Visit: Payer: Self-pay

## 2020-02-26 ENCOUNTER — Ambulatory Visit (INDEPENDENT_AMBULATORY_CARE_PROVIDER_SITE_OTHER): Payer: Medicare Other

## 2020-02-26 ENCOUNTER — Encounter: Payer: Self-pay | Admitting: Cardiology

## 2020-02-26 ENCOUNTER — Ambulatory Visit (INDEPENDENT_AMBULATORY_CARE_PROVIDER_SITE_OTHER): Payer: Medicare Other | Admitting: Cardiology

## 2020-02-26 VITALS — BP 149/75 | HR 75 | Ht 66.0 in | Wt 143.8 lb

## 2020-02-26 DIAGNOSIS — I480 Paroxysmal atrial fibrillation: Secondary | ICD-10-CM | POA: Diagnosis not present

## 2020-02-26 DIAGNOSIS — I209 Angina pectoris, unspecified: Secondary | ICD-10-CM | POA: Diagnosis not present

## 2020-02-26 DIAGNOSIS — R55 Syncope and collapse: Secondary | ICD-10-CM

## 2020-02-26 DIAGNOSIS — Z87891 Personal history of nicotine dependence: Secondary | ICD-10-CM

## 2020-02-26 DIAGNOSIS — I2510 Atherosclerotic heart disease of native coronary artery without angina pectoris: Secondary | ICD-10-CM

## 2020-02-26 DIAGNOSIS — E782 Mixed hyperlipidemia: Secondary | ICD-10-CM

## 2020-02-26 DIAGNOSIS — E088 Diabetes mellitus due to underlying condition with unspecified complications: Secondary | ICD-10-CM

## 2020-02-26 DIAGNOSIS — E039 Hypothyroidism, unspecified: Secondary | ICD-10-CM

## 2020-02-26 NOTE — Patient Instructions (Signed)
Medication Instructions:  No medication changes. *If you need a refill on your cardiac medications before your next appointment, please call your pharmacy*   Lab Work: Your physician recommends that you return for lab work in: before your next visit. You need to have labs done when you are fasting.  You can come Monday through Friday 8:30 am to 12:00 pm and 1:15 to 4:30. You do not need to make an appointment as the order has already been placed. The labs you are going to have done are BMET, CBC, TSH, LFT and Lipids.   If you have labs (blood work) drawn today and your tests are completely normal, you will receive your results only by: Marland Kitchen MyChart Message (if you have MyChart) OR . A paper copy in the mail If you have any lab test that is abnormal or we need to change your treatment, we will call you to review the results.   Testing/Procedures: Your physician has recommended that you wear an event monitor. Event monitors are medical devices that record the heart's electrical activity. Doctors most often [place] these monitors to diagnose arrhythmias. Arrhythmias are problems with the speed or rhythm of the heartbeat. The monitor is a small, portable device. You can wear one while you do your normal daily activities. This is usually used to diagnose what is causing palpitations/syncope (passing out).  Wear for 30 days.   Follow-Up: At [facility], you and your health needs are our priority.  As part of our continuing mission to provide you with exceptional heart care, we have created designated Provider Care Teams.  These Care Teams include your primary Cardiologist (physician) and Advanced Practice Providers (APPs -  Physician Assistants and Nurse Practitioners) who all work together to provide you with the care you need, when you need it.  We recommend signing up for the patient portal called "MyChart".  Sign up information is provided on this After Visit Summary.  MyChart is used to connect  with patients for Virtual Visits (Telemedicine).  Patients are able to view lab/test results, encounter notes, upcoming appointments, etc.  Non-urgent messages can be sent to your provider as well.   To learn more about what you can do with MyChart, go to [email or web address].    Your next appointment:   3 month(s)  The format for your next appointment:   In Person  Provider:   Jyl Heinz, MD   Other Instructions NA

## 2020-02-26 NOTE — Addendum Note (Signed)
Addended by: Truddie Hidden on: 02/26/2020 03:45 PM   Modules accepted: Orders

## 2020-02-26 NOTE — Progress Notes (Signed)
Cardiology Office Note:    Date:  02/26/2020   ID:  Courtney Gardner, DOB 14-Aug-1950, MRN [id number]  PCP:  [facility]  Cardiologist:  Jenean Lindau, MD   Referring MD: Norwood Levo, MD    ASSESSMENT:    1. Angina pectoris ([place])   2. Coronary artery disease involving native coronary artery of native heart without angina pectoris   3. Paroxysmal atrial fibrillation (H[facility])   4. Diabetes mellitus due to underlying condition with unspecified complications ([place])   5. Ex-smoker    PLAN:    In order of problems listed above:  1. Coronary artery disease: Secondary prevention stressed with the patient.  Importance of compliance with diet medication stressed and she vocalized understanding.  She walks on a regular basis and has felt much better since her last evaluation. 2. Essential hypertension: Blood pressure stable and medication adjustment was done at the emergency room. 3. History of stroke: This is based on the CT scan finding.  She has had atrial fibrillation in the perioperative.  And was on amiodarone.  This was discontinued because of bradycardia arrhythmia suspicion in the emergency room.  I told her that I would like to get a 1 month monitor just to make sure she is not having any atrial fibrillation or any such issues in view of the CT scan report.  Atrial fibrillation, anticoagulation benefits and potential is explained.  Currently she wants be assessed by monitoring.  She is not keen on anticoagulation.  I do not feel that there is a strong indication for anticoagulation in view of the fact that there has been no recurrence of atrial fibrillation outside the perioperative..  Her monitoring will help [place] understand this better.  She is agreeable on this. 4. Mixed dyslipidemia and diabetes mellitus: Diet was emphasized.  She is well understanding of this issue and is taking good care of herself. 5. She will be seen in follow-up appointment in 3 months or earlier if  she has any concerns.  She will come back for blood work before her next visit.  Patient and attendant had multiple questions which were answered to their satisfaction.   Medication Adjustments/Labs and Tests Ordered: Current medicines are reviewed at length with the patient today.  Concerns regarding medicines are outlined above.  No orders of the defined types were placed in this encounter.  No orders of the defined types were placed in this encounter.    No chief complaint on file.    History of Present Illness:    Courtney Gardner is a 69 y.o. female.  Patient has past medical history of coronary artery disease post CABG surgery, essential hypertension dyslipidemia and diabetes mellitus.  She denies any problems at this time and takes care of activities of daily living.  No chest pain orthopnea or PND.  At the time of my evaluation, the patient is alert awake oriented and in no distress.  She recently went to the emergency room for dizzy spell.  Her amiodarone was discontinued.  At the time of my evaluation, the patient is alert awake oriented and in no distress.  She keeps a good track of her blood pressure at home and her blood pressure last night was 120/68.  Past Medical History:  Diagnosis Date  . Abdominal bruit 12/12/2019  . AKI (acute kidney injury) ([place]) 10/17/2019  . Angina pectoris ([place]) 11/20/2017  . Barrett's esophagus 09/30/2019  . Coronary artery disease involving native coronary artery of native heart  12/26/2017  . Diabetes mellitus due to underlying condition with unspecified complications ([place]) 71/09/2692   Type 2, on metformin  . Essential hypertension 11/20/2017  . Ex-smoker 11/20/2017  . Glaucoma 09/30/2019  . Hyperlipidemia 11/20/2017  . Hyperlipidemia associated with type 2 diabetes mellitus ([place]) 10/2017  . Hypertension associated with diabetes ([place]) 11/20/2017  . Hypothyroidism 10/17/2019  . Iliotibial band syndrome of left side 09/09/2015  . Lacunar infarction ([place])  02/19/2020  . Palpitations 08/12/2019  . Paroxysmal atrial fibrillation ([place]) 10/17/2019  . Pleural effusion on left 10/17/2019  . Renal calculi 09/30/2019  . Syncope 02/19/2020    Past Surgical History:  Procedure Laterality Date  . CHOLECYSTECTOMY    . CORONARY ARTERY BYPASS GRAFT N/A 10/02/2019   Procedure: CORONARY ARTERY BYPASS GRAFTING (CABG) TIMES TWO, ON PUMP, USING LEFT INTERNAL MAMMARY ARTERY AND RIGHT GREATER SAPHENOUS VEIN HARVESTED ENDOSCOPICALLY;  Surgeon: [name], MD;  Location: [place];  Service: Open Heart Surgery;  Laterality: N/A;  LIMA to LAD, SVG to OM 1  . INTRAVASCULAR PRESSURE WIRE/FFR STUDY N/A 11/23/2017   Procedure: INTRAVASCULAR PRESSURE WIRE/FFR STUDY;  Surgeon: Jettie Booze, MD;  Location: [place] [facility];  Service: Cardiovascular;  Laterality: N/A;  . IR THORACENTESIS ASP PLEURAL SPACE W/IMG GUIDE  12/04/2019  . LEFT HEART CATH AND CORONARY ANGIOGRAPHY N/A 11/23/2017   Procedure: LEFT HEART CATH AND CORONARY ANGIOGRAPHY;  Surgeon: Jettie Booze, MD;  Location: [place] [facility];  Service: Cardiovascular;  Laterality: N/A;  . LEFT HEART CATH AND CORONARY ANGIOGRAPHY N/A 09/30/2019   Procedure: LEFT HEART CATH AND CORONARY ANGIOGRAPHY;  Surgeon: Troy Sine, MD;  Location: [place] [facility];  Service: Cardiovascular;  Laterality: N/A;  . MEDIASTINAL EXPLORATION N/A 10/03/2019   Procedure: MEDIASTINAL WASHOUT;  Surgeon: [name], MD;  Location: [place];  Service: Thoracic;  Laterality: N/A;  . TEE WITHOUT CARDIOVERSION N/A 10/02/2019   Procedure: TRANSESOPHAGEAL ECHOCARDIOGRAM (TEE);  Surgeon: [name], MD;  Location: [place];  Service: Open Heart Surgery;  Laterality: N/A;  . TUBAL LIGATION      Current Medications: Current Meds  Medication Sig  . amLODipine (NORVASC) 10 MG tablet Take 1 tablet (10 mg total) by mouth daily.  Marland Kitchen aspirin EC 81 MG tablet Take 1 tablet (81 mg total) by mouth daily.  Marland Kitchen atorvastatin (LIPITOR)  80 MG tablet Take 80 mg by mouth daily.  . hydrALAZINE (APRESOLINE) 50 MG tablet Take 1 tablet (50 mg total) by mouth every 8 (eight) hours.  Marland Kitchen levothyroxine (SYNTHROID, LEVOTHROID) 50 MCG tablet Take 50 mcg by mouth daily before breakfast.   . lisinopril (ZESTRIL) 20 MG tablet Take 1 tablet (20 mg total) by mouth daily.  . metFORMIN (GLUCOPHAGE) 500 MG tablet Take 500 mg by mouth daily with breakfast.   . nitroGLYCERIN (NITROSTAT) 0.4 MG SL tablet Place 1 tablet (0.4 mg total) under the tongue every 5 (five) minutes as needed for chest pain.  Marland Kitchen omeprazole (PRILOSEC) 40 MG capsule Take 1 capsule (40 mg total) by mouth daily.  Marland Kitchen PARoxetine (PAXIL) 10 MG tablet Take 10 mg by mouth daily.  . Vitamin D, Ergocalciferol, (DRISDOL) 1.25 MG (50000 UNIT) CAPS capsule Take 50,000 Units by mouth once a week.     Allergies:   Patient has no known allergies.   Social History   Socioeconomic History  . Marital status: Unknown    Spouse name: Not on file  . Number of children: Not on file  .  Years of education: Not on file  . Highest education level: Not on file  Occupational History  . Not on file  Tobacco Use  . Smoking status: Former Research scientist (life sciences)  . Smokeless tobacco: Never Used  Vaping Use  . Vaping Use: Never used  Substance and Sexual Activity  . Alcohol use: Not Currently  . Drug use: Never  . Sexual activity: Not on file  Other Topics Concern  . Not on file  Social History Narrative  . Not on file   Social Determinants of Health   Financial Resource Strain:   . Difficulty of Paying Living Expenses: Not on file  Food Insecurity:   . Worried About Charity fundraiser in the Last Year: Not on file  . Ran Out of Food in the Last Year: Not on file  Transportation Needs:   . Lack of Transportation (Medical): Not on file  . Lack of Transportation (Non-Medical): Not on file  Physical Activity:   . Days of Exercise per Week: Not on file  . Minutes of Exercise per Session: Not on file    Stress:   . Feeling of Stress : Not on file  Social Connections:   . Frequency of Communication with Friends and Family: Not on file  . Frequency of Social Gatherings with Friends and Family: Not on file  . Attends Religious Services: Not on file  . Active Member of Clubs or Organizations: Not on file  . Attends Archivist Meetings: Not on file  . Marital Status: Not on file     Family History: The patient's family history includes Heart attack in her maternal grandfather, maternal grandmother, and maternal uncle; Hypertension in her sister; Leukemia in her mother; Lung cancer in her father.  ROS:   Please see the history of present illness.    All other systems reviewed and are negative.  EKGs/Labs/Other Studies Reviewed:    The following studies were reviewed today: Summary:  Renal:    Right: Normal size right kidney. 1-59% stenosis of the right renal     artery. lower range of stenosis.  Left: Normal size of left kidney. 1-59% stenosis of the left renal     artery. lower range of stenosis.  Mesenteric:  Normal Celiac artery and Superior Mesenteric artery findings.    *See table(s) above for measurements and observations.    Diagnosing physician: Shirlee More MD    Electronically signed by Shirlee More MD on 01/01/2020 at 5:07:17 PM.   EKG reveals sinus rhythm and nonspecific ST-T changes.   Recent Labs: 10/17/2019: B Natriuretic Peptide 138.5 02/19/2020: Hemoglobin 12.0; Platelets 383; TSH 1.111 02/20/2020: ALT 11; BUN 15; Creatinine, Ser 1.32; Magnesium 1.8; Potassium 4.1; Sodium 139  Recent Lipid Panel    Component Value Date/Time   CHOL 196 12/12/2019 1517   TRIG 133 12/12/2019 1517   HDL 54 12/12/2019 1517   CHOLHDL 3.6 12/12/2019 1517   LDLCALC 118 (H) 12/12/2019 1517    Physical Exam:    VS:  BP (!) 149/75   Pulse 75   Ht 5\' 6"  (1.676 m)   Wt 143 lb 12.8 oz (65.2 kg)   SpO2 96%   BMI 23.21 kg/m     Wt Readings from Last 3  Encounters:  02/26/20 143 lb 12.8 oz (65.2 kg)  02/20/20 143 lb (64.9 kg)  12/12/19 146 lb (66.2 kg)     GEN: Patient is in no acute distress HEENT: Normal NECK: No JVD; No carotid bruits LYMPHATICS:  No lymphadenopathy CARDIAC: Hear sounds regular, 2/6 systolic murmur at the apex. RESPIRATORY:  Clear to auscultation without rales, wheezing or rhonchi  ABDOMEN: Soft, non-tender, non-distended MUSCULOSKELETAL:  No edema; No deformity  SKIN: Warm and dry NEUROLOGIC:  Alert and oriented x 3 PSYCHIATRIC:  Normal affect   Signed, Jenean Lindau, MD  02/26/2020 3:23 PM    [facility]

## 2020-03-01 ENCOUNTER — Ambulatory Visit: Payer: Medicare Other | Admitting: Cardiovascular Disease

## 2020-03-01 NOTE — Progress Notes (Deleted)
Hypertension Clinic Initial Assessment:    Date:  03/01/2020   ID:  Courtney Gardner, DOB [birth year], MRN [id number]  PCP:  [facility]  Cardiologist:  Jenean Lindau, MD  Nephrologist:  Referring MD: [facility]   CC: Hypertension  History of Present Illness:    Courtney Gardner is a 69 y.o. female with a hx of *** here to establish care in the hypertension clinic.    Previous antihypertensives:  Secondary Causes of Hypertension  Medications/Herbal: OCP, steroids, stimulants, antidepressants, weight loss medication, immune suppressants, NSAIDs, sympathomimetics, alcohol, caffeine, licorice, ginseng, St. John's wort, chemo  Sleep Apnea Renal artery stenosis Hyperaldosteronism Hyper/hypothyroidism Pheochromocytoma: palpitations, tachycardia, headache, diaphoresis (plasma metanephrines) Cushing's syndrome: Cushingoid facies, central obesity, proximal muscle weakness, and ecchymoses, adrenal incidentaloma (cortisol) Coarctation of the aorta  Past Medical History:  Diagnosis Date  . Abdominal bruit 12/12/2019  . AKI (acute kidney injury) ([place]) 10/17/2019  . Angina pectoris ([place]) 11/20/2017  . Barrett's esophagus 09/30/2019  . Coronary artery disease involving native coronary artery of native heart 12/26/2017  . Diabetes mellitus due to underlying condition with unspecified complications ([place]) 89/38/1017   Type 2, on metformin  . Essential hypertension 11/20/2017  . Ex-smoker 11/20/2017  . Glaucoma 09/30/2019  . Hyperlipidemia 11/20/2017  . Hyperlipidemia associated with type 2 diabetes mellitus ([place]) 10/2017  . Hypertension associated with diabetes ([place]) 11/20/2017  . Hypothyroidism 10/17/2019  . Iliotibial band syndrome of left side 09/09/2015  . Lacunar infarction ([place]) 02/19/2020  . Palpitations 08/12/2019  . Paroxysmal atrial fibrillation ([place]) 10/17/2019  . Pleural effusion on left 10/17/2019  . Renal calculi 09/30/2019  . Syncope 02/19/2020    Past  Surgical History:  Procedure Laterality Date  . CHOLECYSTECTOMY    . CORONARY ARTERY BYPASS GRAFT N/A 10/02/2019   Procedure: CORONARY ARTERY BYPASS GRAFTING (CABG) TIMES TWO, ON PUMP, USING LEFT INTERNAL MAMMARY ARTERY AND RIGHT GREATER SAPHENOUS VEIN HARVESTED ENDOSCOPICALLY;  Surgeon: [name], MD;  Location: [place];  Service: Open Heart Surgery;  Laterality: N/A;  LIMA to LAD, SVG to OM 1  . INTRAVASCULAR PRESSURE WIRE/FFR STUDY N/A 11/23/2017   Procedure: INTRAVASCULAR PRESSURE WIRE/FFR STUDY;  Surgeon: Jettie Booze, MD;  Location: [place] [facility];  Service: Cardiovascular;  Laterality: N/A;  . IR THORACENTESIS ASP PLEURAL SPACE W/IMG GUIDE  12/04/2019  . LEFT HEART CATH AND CORONARY ANGIOGRAPHY N/A 11/23/2017   Procedure: LEFT HEART CATH AND CORONARY ANGIOGRAPHY;  Surgeon: Jettie Booze, MD;  Location: [place] [facility];  Service: Cardiovascular;  Laterality: N/A;  . LEFT HEART CATH AND CORONARY ANGIOGRAPHY N/A 09/30/2019   Procedure: LEFT HEART CATH AND CORONARY ANGIOGRAPHY;  Surgeon: Troy Sine, MD;  Location: [place] [facility];  Service: Cardiovascular;  Laterality: N/A;  . MEDIASTINAL EXPLORATION N/A 10/03/2019   Procedure: MEDIASTINAL WASHOUT;  Surgeon: [name], MD;  Location: [place];  Service: Thoracic;  Laterality: N/A;  . TEE WITHOUT CARDIOVERSION N/A 10/02/2019   Procedure: TRANSESOPHAGEAL ECHOCARDIOGRAM (TEE);  Surgeon: [name], MD;  Location: [place];  Service: Open Heart Surgery;  Laterality: N/A;  . TUBAL LIGATION      Current Medications: No outpatient medications have been marked as taking for the 03/01/20 encounter (Appointment) with Skeet Latch, MD.     Allergies:   Patient has no known allergies.   Social History   Socioeconomic History  . Marital status: Unknown    Spouse name: Not on file  . Number of  children: Not on file  . Years of education: Not on file  . Highest education level: Not on file    Occupational History  . Not on file  Tobacco Use  . Smoking status: Former Research scientist (life sciences)  . Smokeless tobacco: Never Used  Vaping Use  . Vaping Use: Never used  Substance and Sexual Activity  . Alcohol use: Not Currently  . Drug use: Never  . Sexual activity: Not on file  Other Topics Concern  . Not on file  Social History Narrative  . Not on file   Social Determinants of Health   Financial Resource Strain:   . Difficulty of Paying Living Expenses: Not on file  Food Insecurity:   . Worried About Charity fundraiser in the Last Year: Not on file  . Ran Out of Food in the Last Year: Not on file  Transportation Needs:   . Lack of Transportation (Medical): Not on file  . Lack of Transportation (Non-Medical): Not on file  Physical Activity:   . Days of Exercise per Week: Not on file  . Minutes of Exercise per Session: Not on file  Stress:   . Feeling of Stress : Not on file  Social Connections:   . Frequency of Communication with Friends and Family: Not on file  . Frequency of Social Gatherings with Friends and Family: Not on file  . Attends Religious Services: Not on file  . Active Member of Clubs or Organizations: Not on file  . Attends Archivist Meetings: Not on file  . Marital Status: Not on file     Family History: The patient's ***family history includes Heart attack in her maternal grandfather, maternal grandmother, and maternal uncle; Hypertension in her sister; Leukemia in her mother; Lung cancer in her father.  ROS:   Please see the history of present illness.    *** All other systems reviewed and are negative.  EKGs/Labs/Other Studies Reviewed:    EKG:  EKG is *** ordered today.  The ekg ordered today demonstrates ***  Recent Labs: 10/17/2019: B Natriuretic Peptide 138.5 02/19/2020: Hemoglobin 12.0; Platelets 383; TSH 1.111 02/20/2020: ALT 11; BUN 15; Creatinine, Ser 1.32; Magnesium 1.8; Potassium 4.1; Sodium 139   Recent Lipid Panel    Component  Value Date/Time   CHOL 196 12/12/2019 1517   TRIG 133 12/12/2019 1517   HDL 54 12/12/2019 1517   CHOLHDL 3.6 12/12/2019 1517   LDLCALC 118 (H) 12/12/2019 1517    Physical Exam:    VS:  There were no vitals taken for this visit.    Wt Readings from Last 3 Encounters:  02/26/20 143 lb 12.8 oz (65.2 kg)  02/20/20 143 lb (64.9 kg)  12/12/19 146 lb (66.2 kg)     GEN: *** Well nourished, well developed in no acute distress HEENT: Normal NECK: No JVD; No carotid bruits LYMPHATICS: No lymphadenopathy CARDIAC: ***RRR, no murmurs, rubs, gallops RESPIRATORY:  Clear to auscultation without rales, wheezing or rhonchi  ABDOMEN: Soft, non-tender, non-distended MUSCULOSKELETAL:  No edema; No deformity  SKIN: Warm and dry NEUROLOGIC:  Alert and oriented x 3 PSYCHIATRIC:  Normal affect   ASSESSMENT:    No diagnosis found.  PLAN:    1. ***  she consents to be monitored in our remote patient monitoring program through [facility].  she will track his blood pressure twice daily and understands that these trends will help [place] to adjust her medications as needed prior to his next appointment.  she *** interested in  enrolling in the PREP exercise and nutrition program through the [facility].     Disposition:    FU with MD/PharmD in {gen number [id number]} {Days to years:10300} {virtual or in-office}   Medication Adjustments/Labs and Tests Ordered: Current medicines are reviewed at length with the patient today.  Concerns regarding medicines are outlined above.  No orders of the defined types were placed in this encounter.  No orders of the defined types were placed in this encounter.    Signed, Skeet Latch, MD  03/01/2020 1:38 PM    [facility]

## 2020-03-05 ENCOUNTER — Ambulatory Visit: Payer: Medicare Other | Admitting: Cardiology

## 2020-03-17 ENCOUNTER — Telehealth: Payer: Self-pay

## 2020-03-17 NOTE — Telephone Encounter (Signed)
Called patient to ask about her preventice monitor after we received a fax stating there had been no transmission on it for 2 days.  Patient informs me she is ok, the monitor was irritating her skin so she removed it and took a shower and will be putting the monitor back on.

## 2020-03-26 DIAGNOSIS — R55 Syncope and collapse: Secondary | ICD-10-CM

## 2020-04-14 ENCOUNTER — Encounter: Payer: Self-pay | Admitting: Cardiovascular Disease

## 2020-04-15 ENCOUNTER — Ambulatory Visit: Payer: Medicare Other | Admitting: Cardiovascular Disease

## 2020-06-01 ENCOUNTER — Encounter: Payer: Self-pay | Admitting: Cardiology

## 2020-06-01 ENCOUNTER — Ambulatory Visit (INDEPENDENT_AMBULATORY_CARE_PROVIDER_SITE_OTHER): Payer: Medicare Other | Admitting: Cardiology

## 2020-06-01 ENCOUNTER — Other Ambulatory Visit: Payer: Self-pay

## 2020-06-01 VITALS — BP 154/74 | HR 76 | Ht 66.0 in | Wt 146.0 lb

## 2020-06-01 DIAGNOSIS — I10 Essential (primary) hypertension: Secondary | ICD-10-CM

## 2020-06-01 DIAGNOSIS — I480 Paroxysmal atrial fibrillation: Secondary | ICD-10-CM

## 2020-06-01 DIAGNOSIS — I2510 Atherosclerotic heart disease of native coronary artery without angina pectoris: Secondary | ICD-10-CM

## 2020-06-01 DIAGNOSIS — E782 Mixed hyperlipidemia: Secondary | ICD-10-CM | POA: Diagnosis not present

## 2020-06-01 DIAGNOSIS — Z87891 Personal history of nicotine dependence: Secondary | ICD-10-CM

## 2020-06-01 DIAGNOSIS — E088 Diabetes mellitus due to underlying condition with unspecified complications: Secondary | ICD-10-CM

## 2020-06-01 NOTE — Patient Instructions (Addendum)
Medication Instructions:  No medication changes. *If you need a refill on your cardiac medications before your next appointment, please call your pharmacy*   Lab Work: Your physician recommends that you return for lab work in: before your next visit. You need to have labs done when you are fasting.  You can come Monday through Friday 8:30 am to 12:00 pm and 1:15 to 4:30. You do not need to make an appointment as the order has already been placed. The labs you are going to have done are BMET, CBC, A1C, TSH, LFT and Lipids.  If you have labs (blood work) drawn today and your tests are completely normal, you will receive your results only by: Marland Kitchen MyChart Message (if you have MyChart) OR . A paper copy in the mail If you have any lab test that is abnormal or we need to change your treatment, we will call you to review the results.   Testing/Procedures: None ordered   Follow-Up: At [facility], you and your health needs are our priority.  As part of our continuing mission to provide you with exceptional heart care, we have created designated Provider Care Teams.  These Care Teams include your primary Cardiologist (physician) and Advanced Practice Providers (APPs -  Physician Assistants and Nurse Practitioners) who all work together to provide you with the care you need, when you need it.  We recommend signing up for the patient portal called "MyChart".  Sign up information is provided on this After Visit Summary.  MyChart is used to connect with patients for Virtual Visits (Telemedicine).  Patients are able to view lab/test results, encounter notes, upcoming appointments, etc.  Non-urgent messages can be sent to your provider as well.   To learn more about what you can do with MyChart, go to [email or web address].    Your next appointment:   3 month(s)  The format for your next appointment:   In Person  Provider:   Jyl Heinz, MD   Other Instructions NA

## 2020-06-01 NOTE — Progress Notes (Signed)
Cardiology Office Note:    Date:  06/01/2020   ID:  Courtney Gardner, DOB 1950-09-10, MRN [id number]  PCP:  Maylon Cos, NP  Cardiologist:  Jenean Lindau, MD   Referring MD: [facility]    ASSESSMENT:    1. Paroxysmal atrial fibrillation (HCC)   2. Mixed hyperlipidemia   3. Essential hypertension   4. Coronary artery disease involving native coronary artery of native heart without angina pectoris   5. Ex-smoker   6. Diabetes mellitus due to underlying condition with unspecified complications ([place])    PLAN:    In order of problems listed above:  1. Coronary artery disease: Secondary prevention stressed with the patient.  Importance of compliance with diet medication stressed any vocalized understanding.  She walks half an hour a day and asked her to continue this. 2. Essential hypertension: Blood pressure stable and diet was emphasized.  She promises to do better. 3. Mixed dyslipidemia: He is on statin therapy and watches her diet well.  She will be back in the next few days for complete blood work including fasting lipids. 4. Diabetes mellitus: Managed by primary care physician.  Diet emphasized. 5. Patient will be seen in follow-up appointment in 6 months or earlier if the patient has any concerns    Medication Adjustments/Labs and Tests Ordered: Current medicines are reviewed at length with the patient today.  Concerns regarding medicines are outlined above.  No orders of the defined types were placed in this encounter.  No orders of the defined types were placed in this encounter.    No chief complaint on file.    History of Present Illness:    Courtney Gardner is a 70 y.o. female.  Patient has past medical history of coronary artery disease post CABG surgery, essential hypertension dyslipidemia.  She denies any problems at this time and takes care of activities of daily living.  No chest pain orthopnea or PND.  She walks on a regular basis.  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  At the time of my evaluation, the patient is alert awake oriented and in no distress.  Past Medical History:  Diagnosis Date  . Abdominal bruit 12/12/2019  . AKI (acute kidney injury) ([place]) 10/17/2019  . Angina pectoris ([place]) 11/20/2017  . Barrett's esophagus 09/30/2019  . Coronary artery disease involving native coronary artery of native heart 12/26/2017  . Diabetes mellitus due to underlying condition with unspecified complications ([place]) 42/35/3614   Type 2, on metformin  . Essential hypertension 11/20/2017  . Ex-smoker 11/20/2017  . Glaucoma 09/30/2019  . Hyperlipidemia 11/20/2017  . Hyperlipidemia associated with type 2 diabetes mellitus ([place]) 10/2017  . Hypertension associated with diabetes ([place]) 11/20/2017  . Hypothyroidism 10/17/2019  . Iliotibial band syndrome of left side 09/09/2015  . Lacunar infarction ([place]) 02/19/2020  . Palpitations 08/12/2019  . Paroxysmal atrial fibrillation ([place]) 10/17/2019  . Pleural effusion on left 10/17/2019  . Renal calculi 09/30/2019  . Syncope 02/19/2020    Past Surgical History:  Procedure Laterality Date  . CHOLECYSTECTOMY    . CORONARY ARTERY BYPASS GRAFT N/A 10/02/2019   Procedure: CORONARY ARTERY BYPASS GRAFTING (CABG) TIMES TWO, ON PUMP, USING LEFT INTERNAL MAMMARY ARTERY AND RIGHT GREATER SAPHENOUS VEIN HARVESTED ENDOSCOPICALLY;  Surgeon: [name], MD;  Location: [place];  Service: Open Heart Surgery;  Laterality: N/A;  LIMA to LAD, SVG to OM 1  . INTRAVASCULAR PRESSURE WIRE/FFR STUDY N/A  11/23/2017   Procedure: INTRAVASCULAR PRESSURE WIRE/FFR STUDY;  Surgeon: Corky CraftsVaranasi, Jayadeep S, MD;  [facility];  Service: Cardiovascular;  Laterality: N/A;  . IR THORACENTESIS ASP PLEURAL SPACE W/IMG GUIDE  12/04/2019  . LEFT HEART CATH AND CORONARY ANGIOGRAPHY N/A 11/23/2017   Procedure: LEFT HEART CATH AND CORONARY ANGIOGRAPHY;  Surgeon: Corky CraftsVaranasi, Jayadeep S, MD;   [facility];  Service: Cardiovascular;  Laterality: N/A;  . LEFT HEART CATH AND CORONARY ANGIOGRAPHY N/A 09/30/2019   Procedure: LEFT HEART CATH AND CORONARY ANGIOGRAPHY;  Surgeon: Lennette BihariKelly, Thomas A, MD;  [facility];  Service: Cardiovascular;  Laterality: N/A;  . MEDIASTINAL EXPLORATION N/A 10/03/2019   Procedure: MEDIASTINAL WASHOUT;  Surgeon: Corliss SkainsLightfoot, Harrell O, MD;  Location: [facility] OR;  Service: Thoracic;  Laterality: N/A;  . TEE WITHOUT CARDIOVERSION N/A 10/02/2019   Procedure: TRANSESOPHAGEAL ECHOCARDIOGRAM (TEE);  Surgeon: Corliss SkainsLightfoot, Harrell O, MD;  [facility];  Service: Open Heart Surgery;  Laterality: N/A;  . TUBAL LIGATION      Current Medications: Current Meds  Medication Sig  . amLODipine (NORVASC) 10 MG tablet Take 1 tablet (10 mg total) by mouth daily.  Marland Kitchen. aspirin EC 81 MG tablet Take 1 tablet (81 mg total) by mouth daily.  Marland Kitchen. atorvastatin (LIPITOR) 80 MG tablet Take 80 mg by mouth daily.  . hydrALAZINE (APRESOLINE) 50 MG tablet Take 1 tablet (50 mg total) by mouth every 8 (eight) hours.  Marland Kitchen. levothyroxine (SYNTHROID, LEVOTHROID) 50 MCG tablet Take 50 mcg by mouth daily before breakfast.   . lisinopril (ZESTRIL) 20 MG tablet Take 1 tablet (20 mg total) by mouth daily.  . metFORMIN (GLUCOPHAGE) 500 MG tablet Take 500 mg by mouth daily with breakfast.   . omeprazole (PRILOSEC) 40 MG capsule Take 1 capsule (40 mg total) by mouth daily.  Marland Kitchen. PARoxetine (PAXIL) 10 MG tablet Take 10 mg by mouth daily.  . Vitamin D, Ergocalciferol, (DRISDOL) 1.25 MG (50000 UNIT) CAPS capsule Take 50,000 Units by mouth once a week.     Allergies:   Patient has no known allergies.   Social History   Socioeconomic History  . Marital status: Unknown    Spouse name: Not on file  . Number of children: Not on file  . Years of education: Not on file  . Highest education level: Not on file  Occupational History  . Not on file  Tobacco Use  . Smoking status: Former Games developermoker   . Smokeless tobacco: Never Used  Vaping Use  . Vaping Use: Never used  Substance and Sexual Activity  . Alcohol use: Not Currently  . Drug use: Never  . Sexual activity: Not on file  Other Topics Concern  . Not on file  Social History Narrative  . Not on file   Social Determinants of Health   Financial Resource Strain: Not on file  Food Insecurity: Not on file  Transportation Needs: Not on file  Physical Activity: Not on file  Stress: Not on file  Social Connections: Not on file     Family History: The patient's family history includes Heart attack in her maternal grandfather, maternal grandmother, and maternal uncle; Hypertension in her sister; Leukemia in her mother; Lung cancer in her father.  ROS:   Please see the history of present illness.    All other systems reviewed and are negative.  EKGs/Labs/Other Studies Reviewed:    The following studies were reviewed today: I discussed my findings with the patient at extensive  length.   Recent Labs: 10/17/2019: B Natriuretic Peptide 138.5 02/19/2020: Hemoglobin 12.0; Platelets 383; TSH 1.111 02/20/2020: ALT 11; BUN 15; Creatinine, Ser 1.32; Magnesium 1.8; Potassium 4.1; Sodium 139  Recent Lipid Panel    Component Value Date/Time   CHOL 196 12/12/2019 1517   TRIG 133 12/12/2019 1517   HDL 54 12/12/2019 1517   CHOLHDL 3.6 12/12/2019 1517   LDLCALC 118 (H) 12/12/2019 1517    Physical Exam:    VS:  BP (!) 154/74   Pulse 76   Ht 5\' 6"  (1.676 m)   Wt 146 lb (66.2 kg)   SpO2 96%   BMI 23.57 kg/m     Wt Readings from Last 3 Encounters:  06/01/20 146 lb (66.2 kg)  02/26/20 143 lb 12.8 oz (65.2 kg)  02/20/20 143 lb (64.9 kg)     GEN: Patient is in no acute distress HEENT: Normal NECK: No JVD; No carotid bruits LYMPHATICS: No lymphadenopathy CARDIAC: Hear sounds regular, 2/6 systolic murmur at the apex. RESPIRATORY:  Clear to auscultation without rales, wheezing or rhonchi  ABDOMEN: Soft, non-tender,  non-distended MUSCULOSKELETAL:  No edema; No deformity  SKIN: Warm and dry NEUROLOGIC:  Alert and oriented x 3 PSYCHIATRIC:  Normal affect   Signed, Jenean Lindau, MD  06/01/2020 2:41 PM    [facility]

## 2020-06-14 ENCOUNTER — Encounter: Payer: Self-pay | Admitting: Cardiology

## 2020-06-14 ENCOUNTER — Telehealth: Payer: Self-pay | Admitting: Cardiology

## 2020-06-14 NOTE — Telephone Encounter (Signed)
Error

## 2020-06-14 NOTE — Telephone Encounter (Signed)
[facility] is requesting a copy of the patient's recent lab results. Please fax results to [phone]

## 2020-06-14 NOTE — Telephone Encounter (Signed)
Advised that the pt has not had any labs since 02/18/21. Sent via [employer].

## 2020-06-15 LAB — CBC WITH DIFFERENTIAL/PLATELET
Basophils Absolute: 0.1 10*3/uL (ref 0.0–0.2)
Basos: 2 %
EOS (ABSOLUTE): 0.2 10*3/uL (ref 0.0–0.4)
Eos: 2 %
Hematocrit: 38.2 % (ref 34.0–46.6)
Hemoglobin: 12.2 g/dL (ref 11.1–15.9)
Immature Grans (Abs): 0 10*3/uL (ref 0.0–0.1)
Immature Granulocytes: 1 %
Lymphocytes Absolute: 2.1 10*3/uL (ref 0.7–3.1)
Lymphs: 25 %
MCH: 29.7 pg (ref 26.6–33.0)
MCHC: 31.9 g/dL (ref 31.5–35.7)
MCV: 93 fL (ref 79–97)
Monocytes Absolute: 0.6 10*3/uL (ref 0.1–0.9)
Monocytes: 7 %
Neutrophils Absolute: 5.5 10*3/uL (ref 1.4–7.0)
Neutrophils: 63 %
Platelets: 380 10*3/uL (ref 150–450)
RBC: 4.11 x10E6/uL (ref 3.77–5.28)
RDW: 13.8 % (ref 11.7–15.4)
WBC: 8.6 10*3/uL (ref 3.4–10.8)

## 2020-06-15 LAB — BASIC METABOLIC PANEL
BUN/Creatinine Ratio: 21 (ref 12–28)
BUN: 23 mg/dL (ref 8–27)
CO2: 25 mmol/L (ref 20–29)
Calcium: 9.2 mg/dL (ref 8.7–10.3)
Chloride: 98 mmol/L (ref 96–106)
Creatinine, Ser: 1.12 mg/dL — ABNORMAL HIGH (ref 0.57–1.00)
GFR calc Af Amer: 58 mL/min/{1.73_m2} — ABNORMAL LOW (ref >59)
GFR calc non Af Amer: 50 mL/min/{1.73_m2} — ABNORMAL LOW (ref >59)
Glucose: 171 mg/dL — ABNORMAL HIGH (ref 65–99)
Potassium: 5.1 mmol/L (ref 3.5–5.2)
Sodium: 137 mmol/L (ref 134–144)

## 2020-06-15 LAB — TSH: TSH: 1.7 u[IU]/mL (ref 0.450–4.500)

## 2020-06-15 LAB — HEMOGLOBIN A1C
Est. average glucose Bld gHb Est-mCnc: 169 mg/dL
Hgb A1c MFr Bld: 7.5 % — ABNORMAL HIGH (ref 4.8–5.6)

## 2020-06-15 LAB — HEPATIC FUNCTION PANEL
ALT: 21 IU/L (ref 0–32)
AST: 19 IU/L (ref 0–40)
Albumin: 4.1 g/dL (ref 3.8–4.8)
Alkaline Phosphatase: 94 IU/L (ref 44–121)
Bilirubin Total: 0.3 mg/dL (ref 0.0–1.2)
Bilirubin, Direct: 0.12 mg/dL (ref 0.00–0.40)
Total Protein: 6.3 g/dL (ref 6.0–8.5)

## 2020-06-15 LAB — LIPID PANEL
Chol/HDL Ratio: 2.6 ratio (ref 0.0–4.4)
Cholesterol, Total: 201 mg/dL — ABNORMAL HIGH (ref 100–199)
HDL: 78 mg/dL (ref >39)
LDL Chol Calc (NIH): 109 mg/dL — ABNORMAL HIGH (ref 0–99)
Triglycerides: 79 mg/dL (ref 0–149)
VLDL Cholesterol Cal: 14 mg/dL (ref 5–40)

## 2020-06-15 MED ORDER — EZETIMIBE 10 MG PO TABS: 10.0000 mg | ORAL_TABLET | Freq: Every day | ORAL | 3 refills | Status: DC

## 2020-06-15 NOTE — Addendum Note (Signed)
Addended by: Truddie Hidden on: 06/15/2020 08:31 AM   Modules accepted: Orders

## 2020-09-08 ENCOUNTER — Ambulatory Visit: Payer: Medicare Other | Admitting: Cardiology

## 2020-10-21 ENCOUNTER — Ambulatory Visit: Payer: Medicare Other | Admitting: Cardiology

## 2020-11-06 ENCOUNTER — Emergency Department (HOSPITAL_COMMUNITY): Payer: Medicare Other

## 2020-11-06 ENCOUNTER — Other Ambulatory Visit: Payer: Self-pay

## 2020-11-06 ENCOUNTER — Inpatient Hospital Stay (HOSPITAL_COMMUNITY)
Admission: EM | Admit: 2020-11-06 | Discharge: 2020-11-10 | DRG: 308 | Disposition: A | Discharge status: Home / Self Care | Payer: Medicare Other | Attending: Internal Medicine | Admitting: Internal Medicine

## 2020-11-06 ENCOUNTER — Encounter (HOSPITAL_COMMUNITY): Payer: Self-pay

## 2020-11-06 DIAGNOSIS — I209 Angina pectoris, unspecified: Secondary | ICD-10-CM

## 2020-11-06 DIAGNOSIS — E119 Type 2 diabetes mellitus without complications: Secondary | ICD-10-CM | POA: Diagnosis present

## 2020-11-06 DIAGNOSIS — R55 Syncope and collapse: Secondary | ICD-10-CM | POA: Diagnosis not present

## 2020-11-06 DIAGNOSIS — H409 Unspecified glaucoma: Secondary | ICD-10-CM | POA: Diagnosis present

## 2020-11-06 DIAGNOSIS — I2699 Other pulmonary embolism without acute cor pulmonale: Secondary | ICD-10-CM

## 2020-11-06 DIAGNOSIS — I152 Hypertension secondary to endocrine disorders: Secondary | ICD-10-CM | POA: Diagnosis present

## 2020-11-06 DIAGNOSIS — I10 Essential (primary) hypertension: Secondary | ICD-10-CM | POA: Diagnosis present

## 2020-11-06 DIAGNOSIS — Z806 Family history of leukemia: Secondary | ICD-10-CM

## 2020-11-06 DIAGNOSIS — I25810 Atherosclerosis of coronary artery bypass graft(s) without angina pectoris: Secondary | ICD-10-CM

## 2020-11-06 DIAGNOSIS — I480 Paroxysmal atrial fibrillation: Secondary | ICD-10-CM | POA: Diagnosis not present

## 2020-11-06 DIAGNOSIS — R0600 Dyspnea, unspecified: Secondary | ICD-10-CM

## 2020-11-06 DIAGNOSIS — Z7984 Long term (current) use of oral hypoglycemic drugs: Secondary | ICD-10-CM

## 2020-11-06 DIAGNOSIS — I6522 Occlusion and stenosis of left carotid artery: Secondary | ICD-10-CM | POA: Diagnosis present

## 2020-11-06 DIAGNOSIS — Z8249 Family history of ischemic heart disease and other diseases of the circulatory system: Secondary | ICD-10-CM

## 2020-11-06 DIAGNOSIS — Z87891 Personal history of nicotine dependence: Secondary | ICD-10-CM

## 2020-11-06 DIAGNOSIS — R519 Headache, unspecified: Secondary | ICD-10-CM

## 2020-11-06 DIAGNOSIS — Z951 Presence of aortocoronary bypass graft: Secondary | ICD-10-CM

## 2020-11-06 DIAGNOSIS — I4892 Unspecified atrial flutter: Secondary | ICD-10-CM | POA: Diagnosis present

## 2020-11-06 DIAGNOSIS — Z79890 Hormone replacement therapy (postmenopausal): Secondary | ICD-10-CM

## 2020-11-06 DIAGNOSIS — N179 Acute kidney failure, unspecified: Secondary | ICD-10-CM | POA: Diagnosis not present

## 2020-11-06 DIAGNOSIS — Z79899 Other long term (current) drug therapy: Secondary | ICD-10-CM

## 2020-11-06 DIAGNOSIS — E785 Hyperlipidemia, unspecified: Secondary | ICD-10-CM | POA: Diagnosis present

## 2020-11-06 DIAGNOSIS — E1169 Type 2 diabetes mellitus with other specified complication: Secondary | ICD-10-CM | POA: Diagnosis present

## 2020-11-06 DIAGNOSIS — Z7982 Long term (current) use of aspirin: Secondary | ICD-10-CM

## 2020-11-06 DIAGNOSIS — E039 Hypothyroidism, unspecified: Secondary | ICD-10-CM | POA: Diagnosis present

## 2020-11-06 DIAGNOSIS — E088 Diabetes mellitus due to underlying condition with unspecified complications: Secondary | ICD-10-CM | POA: Diagnosis present

## 2020-11-06 DIAGNOSIS — I951 Orthostatic hypotension: Secondary | ICD-10-CM | POA: Diagnosis present

## 2020-11-06 DIAGNOSIS — Z8673 Personal history of transient ischemic attack (TIA), and cerebral infarction without residual deficits: Secondary | ICD-10-CM

## 2020-11-06 DIAGNOSIS — Z20822 Contact with and (suspected) exposure to covid-19: Secondary | ICD-10-CM | POA: Diagnosis present

## 2020-11-06 DIAGNOSIS — I2510 Atherosclerotic heart disease of native coronary artery without angina pectoris: Secondary | ICD-10-CM | POA: Diagnosis present

## 2020-11-06 DIAGNOSIS — Z801 Family history of malignant neoplasm of trachea, bronchus and lung: Secondary | ICD-10-CM

## 2020-11-06 HISTORY — DX: Unspecified malignant neoplasm of skin of left upper limb, including shoulder: C44.609

## 2020-11-06 HISTORY — DX: Malignant (primary) neoplasm, unspecified: C80.1

## 2020-11-06 HISTORY — DX: Cardiac arrhythmia, unspecified: I49.9

## 2020-11-06 LAB — D-DIMER, QUANTITATIVE: D-Dimer, Quant: 1.39 ug/mL-FEU — ABNORMAL HIGH (ref 0.00–0.50)

## 2020-11-06 LAB — CBC
HCT: 39.5 % (ref 36.0–46.0)
Hemoglobin: 13.1 g/dL (ref 12.0–15.0)
MCH: 31.4 pg (ref 26.0–34.0)
MCHC: 33.2 g/dL (ref 30.0–36.0)
MCV: 94.7 fL (ref 80.0–100.0)
Platelets: 320 10*3/uL (ref 150–400)
RBC: 4.17 MIL/uL (ref 3.87–5.11)
RDW: 13.1 % (ref 11.5–15.5)
WBC: 11.6 10*3/uL — ABNORMAL HIGH (ref 4.0–10.5)
nRBC: 0 % (ref 0.0–0.2)

## 2020-11-06 LAB — MAGNESIUM: Magnesium: 1.9 mg/dL (ref 1.7–2.4)

## 2020-11-06 LAB — BASIC METABOLIC PANEL
Anion gap: 11 (ref 5–15)
BUN: 20 mg/dL (ref 8–23)
CO2: 23 mmol/L (ref 22–32)
Calcium: 9.3 mg/dL (ref 8.9–10.3)
Chloride: 102 mmol/L (ref 98–111)
Creatinine, Ser: 1.19 mg/dL — ABNORMAL HIGH (ref 0.44–1.00)
GFR, Estimated: 49 mL/min — ABNORMAL LOW (ref >60)
Glucose, Bld: 212 mg/dL — ABNORMAL HIGH (ref 70–99)
Potassium: 3.9 mmol/L (ref 3.5–5.1)
Sodium: 136 mmol/L (ref 135–145)

## 2020-11-06 LAB — RESP PANEL BY RT-PCR (FLU A&B, COVID) ARPGX2
Influenza A by PCR: NEGATIVE
Influenza B by PCR: NEGATIVE
SARS Coronavirus 2 by RT PCR: NEGATIVE

## 2020-11-06 LAB — GLUCOSE, CAPILLARY: Glucose-Capillary: 128 mg/dL — ABNORMAL HIGH (ref 70–99)

## 2020-11-06 LAB — CBG MONITORING, ED: Glucose-Capillary: 220 mg/dL — ABNORMAL HIGH (ref 70–99)

## 2020-11-06 LAB — TROPONIN I (HIGH SENSITIVITY)
Troponin I (High Sensitivity): 12 ng/L (ref <18)
Troponin I (High Sensitivity): 13 ng/L (ref <18)

## 2020-11-06 MED ORDER — ASPIRIN 300 MG RE SUPP
300.0000 mg | RECTAL | Status: AC
  Filled 2020-11-06: qty 1

## 2020-11-06 MED ORDER — SODIUM CHLORIDE 0.9% FLUSH
3.0000 mL | Freq: Two times a day (BID) | INTRAVENOUS | Status: DC
  Administered 2020-11-06 – 2020-11-10 (×6): 3 mL via INTRAVENOUS

## 2020-11-06 MED ORDER — ATORVASTATIN CALCIUM 80 MG PO TABS
80.0000 mg | ORAL_TABLET | Freq: Every day | ORAL | Status: DC
  Administered 2020-11-06 – 2020-11-10 (×5): 80 mg via ORAL
  Filled 2020-11-06 (×5): qty 1

## 2020-11-06 MED ORDER — NITROGLYCERIN 0.4 MG SL SUBL: 0.4000 mg | SUBLINGUAL_TABLET | SUBLINGUAL | Status: DC | PRN

## 2020-11-06 MED ORDER — SPIRONOLACTONE 12.5 MG HALF TABLET
12.5000 mg | ORAL_TABLET | Freq: Every day | ORAL | Status: DC
  Administered 2020-11-06 – 2020-11-07 (×2): 12.5 mg via ORAL
  Filled 2020-11-06 (×2): qty 1

## 2020-11-06 MED ORDER — ACETAMINOPHEN ER 650 MG PO TBCR: 650.0000 mg | EXTENDED_RELEASE_TABLET | Freq: Three times a day (TID) | ORAL | Status: DC | PRN

## 2020-11-06 MED ORDER — HYDRALAZINE HCL 50 MG PO TABS
50.0000 mg | ORAL_TABLET | Freq: Two times a day (BID) | ORAL | Status: DC
  Administered 2020-11-06 – 2020-11-07 (×2): 50 mg via ORAL
  Filled 2020-11-06 (×2): qty 1

## 2020-11-06 MED ORDER — ASPIRIN 81 MG PO CHEW
324.0000 mg | CHEWABLE_TABLET | Freq: Once | ORAL | Status: AC
  Administered 2020-11-06: 324 mg via ORAL
  Filled 2020-11-06: qty 4

## 2020-11-06 MED ORDER — SODIUM CHLORIDE 0.9% FLUSH: 3.0000 mL | INTRAVENOUS | Status: DC | PRN

## 2020-11-06 MED ORDER — ACETAMINOPHEN 325 MG PO TABS: 650.0000 mg | ORAL_TABLET | ORAL | Status: DC | PRN

## 2020-11-06 MED ORDER — BOOST HIGH PROTEIN PO LIQD
1.0000 | ORAL | Status: DC
  Administered 2020-11-06: 237 mL via ORAL
  Filled 2020-11-06 (×3): qty 237

## 2020-11-06 MED ORDER — ONDANSETRON HCL 4 MG/2ML IJ SOLN
4.0000 mg | Freq: Four times a day (QID) | INTRAMUSCULAR | Status: DC | PRN
  Administered 2020-11-07: 4 mg via INTRAVENOUS
  Filled 2020-11-06: qty 2

## 2020-11-06 MED ORDER — EZETIMIBE 10 MG PO TABS
10.0000 mg | ORAL_TABLET | Freq: Every day | ORAL | Status: DC
  Administered 2020-11-06 – 2020-11-10 (×5): 10 mg via ORAL
  Filled 2020-11-06 (×5): qty 1

## 2020-11-06 MED ORDER — AMLODIPINE BESYLATE 10 MG PO TABS
10.0000 mg | ORAL_TABLET | Freq: Every day | ORAL | Status: DC
  Administered 2020-11-06 – 2020-11-07 (×2): 10 mg via ORAL
  Filled 2020-11-06 (×2): qty 1

## 2020-11-06 MED ORDER — ASPIRIN 81 MG PO CHEW
324.0000 mg | CHEWABLE_TABLET | ORAL | Status: AC
  Administered 2020-11-06: 324 mg via ORAL
  Filled 2020-11-06: qty 4

## 2020-11-06 MED ORDER — PANTOPRAZOLE SODIUM 40 MG PO TBEC
40.0000 mg | DELAYED_RELEASE_TABLET | Freq: Every day | ORAL | Status: DC
  Administered 2020-11-06 – 2020-11-10 (×5): 40 mg via ORAL
  Filled 2020-11-06 (×5): qty 1

## 2020-11-06 MED ORDER — PAROXETINE HCL 10 MG PO TABS
10.0000 mg | ORAL_TABLET | Freq: Every day | ORAL | Status: DC
  Administered 2020-11-06 – 2020-11-10 (×5): 10 mg via ORAL
  Filled 2020-11-06 (×5): qty 1

## 2020-11-06 MED ORDER — HEPARIN SODIUM (PORCINE) 5000 UNIT/ML IJ SOLN
5000.0000 [IU] | Freq: Three times a day (TID) | INTRAMUSCULAR | Status: DC
  Administered 2020-11-06 – 2020-11-07 (×2): 5000 [IU] via SUBCUTANEOUS
  Filled 2020-11-06 (×2): qty 1

## 2020-11-06 MED ORDER — ASPIRIN EC 81 MG PO TBEC
81.0000 mg | DELAYED_RELEASE_TABLET | Freq: Every day | ORAL | Status: DC
  Administered 2020-11-07 – 2020-11-10 (×4): 81 mg via ORAL
  Filled 2020-11-06 (×4): qty 1

## 2020-11-06 MED ORDER — LISINOPRIL 20 MG PO TABS
20.0000 mg | ORAL_TABLET | Freq: Every day | ORAL | Status: DC
  Administered 2020-11-06 – 2020-11-07 (×2): 20 mg via ORAL
  Filled 2020-11-06 (×2): qty 1

## 2020-11-06 MED ORDER — LEVOTHYROXINE SODIUM 50 MCG PO TABS
50.0000 ug | ORAL_TABLET | Freq: Every day | ORAL | Status: DC
  Administered 2020-11-07 – 2020-11-10 (×4): 50 ug via ORAL
  Filled 2020-11-06 (×4): qty 1

## 2020-11-06 MED ORDER — SODIUM CHLORIDE 0.9 % IV SOLN: 250.0000 mL | INTRAVENOUS | Status: DC | PRN

## 2020-11-06 MED ORDER — HYDRALAZINE HCL 20 MG/ML IJ SOLN
10.0000 mg | Freq: Once | INTRAMUSCULAR | Status: AC
  Administered 2020-11-06: 10 mg via INTRAVENOUS
  Filled 2020-11-06: qty 1

## 2020-11-06 MED ORDER — ASPIRIN EC 81 MG PO TBEC: 81.0000 mg | DELAYED_RELEASE_TABLET | Freq: Every day | ORAL | Status: DC

## 2020-11-06 MED ORDER — ZOLPIDEM TARTRATE 5 MG PO TABS
5.0000 mg | ORAL_TABLET | Freq: Every evening | ORAL | Status: DC | PRN
  Administered 2020-11-06: 5 mg via ORAL
  Filled 2020-11-06: qty 1

## 2020-11-06 NOTE — ED Notes (Signed)
Attempted to give reportx1 

## 2020-11-06 NOTE — H&P (Addendum)
Cardiology Admission History and Physical:   Patient ID: Courtney Gardner MRN: [id number]; DOB: 1950-05-06   Admission date: 11/06/2020  PCP:  Maylon Cos, NP   [place] [facility] Cardiologist:  Jenean Lindau, MD         Chief Complaint:  syncope  Patient Profile:   Courtney Gardner is a 70 y.o. female with CAD and CABG who is being seen 11/06/2020 for the evaluation of syncope.  History of Present Illness:   Courtney Gardner presents with syncope.  She walked to the board.  Became nauseated, diaphoretic, hot sat down for about 20 minutes.  Persisting symptoms, she decided to get up and walk to the house and lost consciousness.  Over the last 3 or 4 days, she has had some unusual sensations in her legs, some shortness of breath and orthostatic intolerance  Hx of syncope 10/21 a few months after having been discharged following  CABG 6/21 for LM disease, having presented with chest pain.  But also a long history of recurrent syncope prior to bypass stereotypically characterized again by diaphoresis nausea and flushing.  Post op course complicated by hypertension and atrial fib>> amiodarone with some concerns re bradycardia.  Syncope presentation assoc with EMS high BP and HR 40s  amio stopped  Echo 5/21 normal EF   Does not use alcohol or marijuana  Past Medical History:  Diagnosis Date   Abdominal bruit 12/12/2019   AKI (acute kidney injury) ([place]) 10/17/2019   Angina pectoris ([place]) 11/20/2017   Barrett's esophagus 09/30/2019   Coronary artery disease involving native coronary artery of native heart 12/26/2017   Diabetes mellitus due to underlying condition with unspecified complications ([place]) 75/64/3329   Type 2, on metformin   Essential hypertension 11/20/2017   Ex-smoker 11/20/2017   Glaucoma 09/30/2019   Hyperlipidemia 11/20/2017   Hyperlipidemia associated with type 2 diabetes mellitus ([place]) 10/2017   Hypertension associated with diabetes ([place]) 11/20/2017   Hypothyroidism  10/17/2019   Iliotibial band syndrome of left side 09/09/2015   Lacunar infarction ([place]) 02/19/2020   Palpitations 08/12/2019   Paroxysmal atrial fibrillation ([place]) 10/17/2019   Pleural effusion on left 10/17/2019   Renal calculi 09/30/2019   Syncope 02/19/2020    Past Surgical History:  Procedure Laterality Date   CHOLECYSTECTOMY     CORONARY ARTERY BYPASS GRAFT N/A 10/02/2019   Procedure: CORONARY ARTERY BYPASS GRAFTING (CABG) TIMES TWO, ON PUMP, USING LEFT INTERNAL MAMMARY ARTERY AND RIGHT GREATER SAPHENOUS VEIN HARVESTED ENDOSCOPICALLY;  Surgeon: [name], MD;  Location: [place];  Service: Open Heart Surgery;  Laterality: N/A;  LIMA to LAD, SVG to OM 1   INTRAVASCULAR PRESSURE WIRE/FFR STUDY N/A 11/23/2017   Procedure: INTRAVASCULAR PRESSURE WIRE/FFR STUDY;  Surgeon: Jettie Booze, MD;  Location: [place] CV LAB;  Service: Cardiovascular;  Laterality: N/A;   IR THORACENTESIS ASP PLEURAL SPACE W/IMG GUIDE  12/04/2019   LEFT HEART CATH AND CORONARY ANGIOGRAPHY N/A 11/23/2017   Procedure: LEFT HEART CATH AND CORONARY ANGIOGRAPHY;  Surgeon: Jettie Booze, MD;  Location: [place] CV LAB;  Service: Cardiovascular;  Laterality: N/A;   LEFT HEART CATH AND CORONARY ANGIOGRAPHY N/A 09/30/2019   Procedure: LEFT HEART CATH AND CORONARY ANGIOGRAPHY;  Surgeon: Troy Sine, MD;  Location: [place] CV LAB;  Service: Cardiovascular;  Laterality: N/A;   MEDIASTINAL EXPLORATION N/A 10/03/2019   Procedure: MEDIASTINAL WASHOUT;  Surgeon: [name], MD;  Location: [place];  Service: Thoracic;  Laterality: N/A;  TEE WITHOUT CARDIOVERSION N/A 10/02/2019   Procedure: TRANSESOPHAGEAL ECHOCARDIOGRAM (TEE);  Surgeon: [name], MD;  Location: [place];  Service: Open Heart Surgery;  Laterality: N/A;   TUBAL LIGATION       Medications Prior to Admission: Prior to Admission medications   Medication Sig Start Date End Date Taking? Authorizing Provider  acetaminophen (TYLENOL  8 HOUR) 650 MG CR tablet Take 650 mg by mouth every 8 (eight) hours as needed for pain.   Yes [provider]  amLODipine (NORVASC) 10 MG tablet Take 1 tablet (10 mg total) by mouth daily. 02/21/20  Yes Kroeger, Lorelee Cover., PA-C  aspirin EC 81 MG tablet Take 1 tablet (81 mg total) by mouth daily. 09/25/19  Yes Revankar, Reita Cliche, MD  atorvastatin (LIPITOR) 80 MG tablet Take 80 mg by mouth daily. 02/06/20  Yes [provider]  ezetimibe (ZETIA) 10 MG tablet Take 1 tablet (10 mg total) by mouth daily. 06/15/20 11/06/20 Yes Revankar, Reita Cliche, MD  hydrALAZINE (APRESOLINE) 50 MG tablet Take 1 tablet (50 mg total) by mouth every 8 (eight) hours. 02/20/20  Yes Kroeger, Daleen Snook M., PA-C  levothyroxine (SYNTHROID, LEVOTHROID) 50 MCG tablet Take 50 mcg by mouth daily before breakfast.  11/12/17  Yes [provider]  lisinopril (ZESTRIL) 20 MG tablet Take 1 tablet (20 mg total) by mouth daily. 02/11/20  Yes Revankar, Reita Cliche, MD  metFORMIN (GLUCOPHAGE) 500 MG tablet Take 500 mg by mouth daily with breakfast.  07/21/19  Yes [provider]  nitroGLYCERIN (NITROSTAT) 0.4 MG SL tablet Place 1 tablet (0.4 mg total) under the tongue every 5 (five) minutes as needed for chest pain. 02/11/20 11/06/20 Yes Revankar, Reita Cliche, MD  Nutritional Supplements (BOOST HIGH PROTEIN PO) Take 1 Can by mouth in the morning and at bedtime.   Yes [provider]  omeprazole (PRILOSEC) 40 MG capsule Take 1 capsule (40 mg total) by mouth daily. 02/11/20  Yes Revankar, Reita Cliche, MD  PARoxetine (PAXIL) 10 MG tablet Take 10 mg by mouth daily. 06/29/19  Yes [provider]     Allergies:   No Known Allergies  Social History:   Social History   Socioeconomic History   Marital status: Unknown    Spouse name: Not on file   Number of children: Not on file   Years of education: Not on file   Highest education level: Not on file  Occupational History   Not on file  Tobacco Use   Smoking status:  Former    Pack years: 0.00   Smokeless tobacco: Never  Vaping Use   Vaping Use: Never used  Substance and Sexual Activity   Alcohol use: Not Currently   Drug use: Never   Sexual activity: Not on file  Other Topics Concern   Not on file  Social History Narrative   Not on file   Social Determinants of Health   Financial Resource Strain: Not on file  Food Insecurity: Not on file  Transportation Needs: Not on file  Physical Activity: Not on file  Stress: Not on file  Social Connections: Not on file  Intimate Partner Violence: Not on file    Family History:    The patient's family history includes Heart attack in her maternal grandfather, maternal grandmother, and maternal uncle; Hypertension in her sister; Leukemia in her mother; Lung cancer in her father.    ROS:  Please see the history of present illness.   All other ROS reviewed and negative.  Physical Exam/Data:   Vitals:   11/06/20 1100 11/06/20 1146 11/06/20 1230 11/06/20 1400  BP: (!) 161/65 (!) 176/61 (!) 162/89 (!) 149/70  Pulse: (!) 57 61 62 61  Resp: 16 18 16 15   Temp:      TempSrc:      SpO2: 100% 99% 98% 98%  Weight:      Height:        Intake/Output Summary (Last 24 hours) at 11/06/2020 1429 Last data filed at 11/06/2020 0945 Gross per 24 hour  Intake 0 ml  Output 0 ml  Net 0 ml   Last 3 Weights 11/06/2020 06/01/2020 02/26/2020  Weight (lbs) 150 lb 146 lb 143 lb 12.8 oz  Weight (kg) 68.04 kg 66.225 kg 65.227 kg     Body mass index is 24.21 kg/m.  General:  Well nourished, well developed, in no acute distress HEENT: normal Lymph: no adenopathy Neck: no JVD Endocrine:  No thryomegaly Vascular: No carotid bruits; FA pulses 2+ bilaterally without bruits  Cardiac:  normal S1, S2; RRR; no murmur  Lungs:  clear to auscultation bilaterally, no wheezing, rhonchi or rales  Abd: soft, nontender, no hepatomegaly  Ext: n edema Musculoskeletal:  No deformities, BUE and BLE strength normal and equal Skin:  warm and dry  Neuro:  CNs 2-12 intact, no focal abnormalities noted Psych:  Normal affect    EKG:  The ECG that was done 7/9 was personally reviewed and demonstrates sinus without STT changes; with normal intervals   Relevant CV Studies:   Laboratory Data:  High Sensitivity Troponin:   Recent Labs  Lab 11/06/20 0947 11/06/20 1201  TROPONINIHS 12 13      Chemistry Recent Labs  Lab 11/06/20 0947  NA 136  K 3.9  CL 102  CO2 23  GLUCOSE 212*  BUN 20  CREATININE 1.19*  CALCIUM 9.3  GFRNONAA 49*  ANIONGAP 11    No results for input(s): PROT, ALBUMIN, AST, ALT, ALKPHOS, BILITOT in the last 168 hours. Hematology Recent Labs  Lab 11/06/20 0947  WBC 11.6*  RBC 4.17  HGB 13.1  HCT 39.5  MCV 94.7  MCH 31.4  MCHC 33.2  RDW 13.1  PLT 320   BNPNo results for input(s): BNP, PROBNP in the last 168 hours.  DDimer No results for input(s): DDIMER in the last 168 hours.   Radiology/Studies:  CT Head Wo Contrast  Result Date: 11/06/2020 CLINICAL DATA:  Minor head trauma.  Syncope this morning EXAM: CT HEAD WITHOUT CONTRAST TECHNIQUE: Contiguous axial images were obtained from the base of the skull through the vertex without intravenous contrast. COMPARISON:  02/19/2020 FINDINGS: Brain: No evidence of acute infarction, hemorrhage, hydrocephalus, extra-axial collection or mass lesion/mass effect. Small remote left occipital cortex infarct, see coronal reformats. Remote perforator infarct at the left basal ganglia. Age congruent brain volume. Vascular: No hyperdense vessel or unexpected calcification. Skull: Normal. Negative for fracture or focal lesion. Sinuses/Orbits: No acute finding. IMPRESSION: 1. No acute finding or change from 2021. 2. 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 Electronically Signed   By: Monte Fantasia M.D.   On: 11/06/2020 11:10   DG Chest Portable 1 View  Result Date: 11/06/2020 CLINICAL DATA:  Chest pain EXAM: PORTABLE CHEST 1 VIEW  COMPARISON:  02/19/2020 FINDINGS: Normal heart size and mediastinal contours. CABG. No acute infiltrate or edema. No effusion or pneumothorax. No acute osseous findings. IMPRESSION: No evidence of active disease. Electronically Signed   By: Neva Seat.D.  On: 11/06/2020 10:33     Assessment and Plan:  Syncope-recurrent  CAD with prior CABG  Dyspnea on exertion  Hypertension-poorly controlled  Tachy palpitations  Sinus Bradycardia ( prompting discontinuing of BB in past)     Recurrent syncope with a protracted prodrome and epiphenomenon suggestive of a neurally mediated reflex.  Moreover, her ECG is normal and at least a year ago prior to bypass her ejection fraction was normal making a cardiac cause of syncope extremely unlikely.  Have reviewed with her the physiology of neurally mediated syncope and the importance of becoming recumbent when exposed to the prodrome.  With recurrent syncope, it would be appropriate to consider implantation of a loop recorder to inform a better understanding of the mechanisms of her syncope.  She is agreeable.  We will plan this as an outpatient.  She also has a history of tachypalpitations and think she is having atrial fibrillation, something she had postoperatively.  The loop recorder will help clarify this as well.  Her blood pressure is poorly controlled.  We will plan to check her orthostatics which could be contributing as well as augment her antihypertensive therapy and simplify her regime by decreasing her hydralazine to twice daily and adding spironolactone 12.5 mg a day.  We need to clarify that her heart function is still normal.  We will undertake an echocardiogram.  But I think it is unlikely, dyspnea in the context of leg sensations and syncope prompted concern of pulmonary embolism and we will check a D-dimer.  If abnormal will pursue CT  She may be a candidate for an SGLT2    For questions or updates, please contact [place] Please consult [email or web address] for contact info under     Signed, Virl Axe, MD  11/06/2020 2:29 PM

## 2020-11-06 NOTE — ED Provider Notes (Signed)
[facility] EMERGENCY DEPARTMENT Provider Note   CSN: [id number] Arrival date & time: 11/06/20  6269     History Chief Complaint  Patient presents with   Near Syncope    Courtney Gardner is a 70 y.o. female.  70 yo F with a chief complaints of chest pain and shortness of breath.  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  Chest pain is right-sided feels like a pressure denies radiation and had some shortness of breath with it.  Had symptoms very similar prior to her having bypass surgery performed.  She ended up feeling lightheaded as she finished her morning chores and when she was trying to make it back to the house she collapsed to the ground.  She thinks she struck her head.  Denies any other significant injury.  Denies cough congestion or fever.  Was given a nitroglycerin in route with EMS and had complete resolution of her symptoms.  The history is provided by the patient.  Near Syncope This is a new problem. The current episode started 3 to 5 hours ago. The problem occurs rarely. The problem has been resolved. Associated symptoms include chest pain and shortness of breath. Pertinent negatives include no headaches. The symptoms are aggravated by exertion. The symptoms are relieved by medications (nitro). She has tried nothing for the symptoms. The treatment provided significant relief.      Past Medical History:  Diagnosis Date   Abdominal bruit 12/12/2019   AKI (acute kidney injury) ([place]) 10/17/2019   Angina pectoris ([place]) 11/20/2017   Barrett's esophagus 09/30/2019   Coronary artery disease involving native coronary artery of native heart 12/26/2017   Diabetes mellitus due to underlying condition with unspecified complications ([place]) 48/54/6270   Type 2, on metformin   Essential hypertension 11/20/2017   Ex-smoker 11/20/2017   Glaucoma 09/30/2019   Hyperlipidemia 11/20/2017   Hyperlipidemia associated with type 2 diabetes mellitus ([place])  10/2017   Hypertension associated with diabetes ([place]) 11/20/2017   Hypothyroidism 10/17/2019   Iliotibial band syndrome of left side 09/09/2015   Lacunar infarction ([place]) 02/19/2020   Palpitations 08/12/2019   Paroxysmal atrial fibrillation ([place]) 10/17/2019   Pleural effusion on left 10/17/2019   Renal calculi 09/30/2019   Syncope 02/19/2020    Patient Active Problem List   Diagnosis Date Noted   Lacunar infarction ([place]) 02/19/2020   Syncope 02/19/2020   Abdominal bruit 12/12/2019   AKI (acute kidney injury) ([place]) 10/17/2019   Pleural effusion on left 10/17/2019   Hyperlipidemia associated with type 2 diabetes mellitus ([place]) 10/17/2019   Hypothyroidism 10/17/2019   Paroxysmal atrial fibrillation ([place]) 10/17/2019   Diabetes mellitus due to underlying condition with unspecified complications ([place]) 35/00/9381   Glaucoma 09/30/2019   Barrett's esophagus 09/30/2019   Renal calculi 09/30/2019   Palpitations 08/12/2019   Coronary artery disease involving native coronary artery of native heart 12/26/2017   Hyperlipidemia 11/20/2017   Angina pectoris ([place]) 11/20/2017   Hypertension associated with diabetes ([place]) 11/20/2017   Ex-smoker 11/20/2017   Essential hypertension 11/20/2017   Iliotibial band syndrome of left side 09/09/2015    Past Surgical History:  Procedure Laterality Date   CHOLECYSTECTOMY     CORONARY ARTERY BYPASS GRAFT N/A 10/02/2019   Procedure: CORONARY ARTERY BYPASS GRAFTING (CABG) TIMES TWO, ON PUMP, USING LEFT INTERNAL MAMMARY ARTERY AND RIGHT GREATER SAPHENOUS VEIN HARVESTED ENDOSCOPICALLY;  Surgeon: [name], MD;  Location: [place];  Service: Open Heart Surgery;  Laterality: N/A;  LIMA to LAD, SVG to OM 1   INTRAVASCULAR PRESSURE WIRE/FFR STUDY N/A 11/23/2017   Procedure: INTRAVASCULAR PRESSURE WIRE/FFR STUDY;  Surgeon: Jettie Booze, MD;  Location: [place] [facility];  Service: Cardiovascular;  Laterality: N/A;   IR THORACENTESIS ASP PLEURAL SPACE W/IMG  GUIDE  12/04/2019   LEFT HEART CATH AND CORONARY ANGIOGRAPHY N/A 11/23/2017   Procedure: LEFT HEART CATH AND CORONARY ANGIOGRAPHY;  Surgeon: Jettie Booze, MD;  Location: [place] [facility];  Service: Cardiovascular;  Laterality: N/A;   LEFT HEART CATH AND CORONARY ANGIOGRAPHY N/A 09/30/2019   Procedure: LEFT HEART CATH AND CORONARY ANGIOGRAPHY;  Surgeon: Troy Sine, MD;  Location: [place] [facility];  Service: Cardiovascular;  Laterality: N/A;   MEDIASTINAL EXPLORATION N/A 10/03/2019   Procedure: MEDIASTINAL WASHOUT;  Surgeon: [name], MD;  Location: [place];  Service: Thoracic;  Laterality: N/A;   TEE WITHOUT CARDIOVERSION N/A 10/02/2019   Procedure: TRANSESOPHAGEAL ECHOCARDIOGRAM (TEE);  Surgeon: [name], MD;  Location: [place];  Service: Open Heart Surgery;  Laterality: N/A;   TUBAL LIGATION       OB History   No obstetric history on file.     Family History  Problem Relation Age of Onset   Leukemia Mother    Lung cancer Father    Hypertension Sister    Heart attack Maternal Grandmother    Heart attack Maternal Grandfather    Heart attack Maternal Uncle     Social History   Tobacco Use   Smoking status: Former    Pack years: 0.00   Smokeless tobacco: Never  Vaping Use   Vaping Use: Never used  Substance Use Topics   Alcohol use: Not Currently   Drug use: Never    Home Medications Prior to Admission medications   Medication Sig Start Date End Date Taking? Authorizing Provider  acetaminophen (TYLENOL 8 HOUR) 650 MG CR tablet Take 650 mg by mouth every 8 (eight) hours as needed for pain.   Yes [provider]  amLODipine (NORVASC) 10 MG tablet Take 1 tablet (10 mg total) by mouth daily. 02/21/20  Yes Kroeger, Lorelee Cover., PA-C  aspirin EC 81 MG tablet Take 1 tablet (81 mg total) by mouth daily. 09/25/19  Yes Revankar, Reita Cliche, MD  atorvastatin (LIPITOR) 80 MG tablet Take 80 mg by mouth daily. 02/06/20  Yes [provider]   ezetimibe (ZETIA) 10 MG tablet Take 1 tablet (10 mg total) by mouth daily. 06/15/20 11/06/20 Yes Revankar, Reita Cliche, MD  hydrALAZINE (APRESOLINE) 50 MG tablet Take 1 tablet (50 mg total) by mouth every 8 (eight) hours. 02/20/20  Yes Kroeger, Daleen Snook M., PA-C  levothyroxine (SYNTHROID, LEVOTHROID) 50 MCG tablet Take 50 mcg by mouth daily before breakfast.  11/12/17  Yes [provider]  lisinopril (ZESTRIL) 20 MG tablet Take 1 tablet (20 mg total) by mouth daily. 02/11/20  Yes Revankar, Reita Cliche, MD  metFORMIN (GLUCOPHAGE) 500 MG tablet Take 500 mg by mouth daily with breakfast.  07/21/19  Yes [provider]  nitroGLYCERIN (NITROSTAT) 0.4 MG SL tablet Place 1 tablet (0.4 mg total) under the tongue every 5 (five) minutes as needed for chest pain. 02/11/20 11/06/20 Yes Revankar, Reita Cliche, MD  Nutritional Supplements (BOOST HIGH PROTEIN PO) Take 1 Can by mouth in the morning and at bedtime.   Yes [provider]  omeprazole (PRILOSEC) 40 MG capsule Take 1 capsule (40 mg total) by mouth daily. 02/11/20  Yes Revankar, Reita Cliche, MD  PARoxetine (PAXIL) 10 MG tablet Take 10 mg by mouth daily. 06/29/19  Yes [provider]    Allergies    Patient has no known allergies.  Review of Systems   Review of Systems  Constitutional:  Negative for chills and fever.  HENT:  Negative for congestion and rhinorrhea.   Eyes:  Negative for redness and visual disturbance.  Respiratory:  Positive for shortness of breath. Negative for wheezing.   Cardiovascular:  Positive for chest pain and near-syncope. Negative for palpitations.  Gastrointestinal:  Negative for nausea and vomiting.  Genitourinary:  Negative for dysuria and urgency.  Musculoskeletal:  Negative for arthralgias and myalgias.  Skin:  Negative for pallor and wound.  Neurological:  Negative for dizziness and headaches.   Physical Exam Updated Vital Signs BP (!) 149/70   Pulse 61   Temp 97.8 F (36.6 C) (Oral)   Resp 15    Ht 5\' 6"  (1.676 m)   Wt 68 kg   SpO2 98%   BMI 24.21 kg/m   Physical Exam Vitals and nursing note reviewed.  Constitutional:      General: She is not in acute distress.    Appearance: She is well-developed. She is not diaphoretic.  HENT:     Head: Normocephalic and atraumatic.  Eyes:     Pupils: Pupils are equal, round, and reactive to light.  Cardiovascular:     Rate and Rhythm: Normal rate and regular rhythm.     Heart sounds: No murmur heard.   No friction rub. No gallop.  Pulmonary:     Effort: Pulmonary effort is normal.     Breath sounds: No wheezing or rales.  Abdominal:     General: There is no distension.     Palpations: Abdomen is soft.     Tenderness: There is no abdominal tenderness.  Musculoskeletal:        General: No tenderness.     Cervical back: Normal range of motion and neck supple.     Comments: Old appearing skin tear to the dorsum of the left hand.  Palpated from head to toe without any obvious noted areas of injury.  Skin:    General: Skin is warm and dry.  Neurological:     Mental Status: She is alert and oriented to person, place, and time.  Psychiatric:        Behavior: Behavior normal.    ED Results / Procedures / Treatments   Labs (all labs ordered are listed, but only abnormal results are displayed) Labs Reviewed  BASIC METABOLIC PANEL - Abnormal; Notable for the following components:      Result Value   Glucose, Bld 212 (*)    Creatinine, Ser 1.19 (*)    GFR, Estimated 49 (*)    All other components within normal limits  CBC - Abnormal; Notable for the following components:   WBC 11.6 (*)    All other components within normal limits  CBG MONITORING, ED - Abnormal; Notable for the following components:   Glucose-Capillary 220 (*)    All other components within normal limits  MAGNESIUM  TROPONIN I (HIGH SENSITIVITY)  TROPONIN I (HIGH SENSITIVITY)    EKG EKG Interpretation  Date/Time:  Saturday November 06 2020 09:23:28  EDT Ventricular Rate:  62 PR Interval:  164 QRS Duration: 91 QT Interval:  438 QTC Calculation: 445 R Axis:   25 Text Interpretation: Sinus rhythm Probable left atrial enlargement no wpw, prolonged qt or  brugada Confirmed by Deno Etienne [phone]) on 11/06/2020 9:40:55 AM  Radiology CT Head Wo Contrast  Result Date: 11/06/2020 CLINICAL DATA:  Minor head trauma.  Syncope this morning EXAM: CT HEAD WITHOUT CONTRAST TECHNIQUE: Contiguous axial images were obtained from the base of the skull through the vertex without intravenous contrast. COMPARISON:  02/19/2020 FINDINGS: Brain: No evidence of acute infarction, hemorrhage, hydrocephalus, extra-axial collection or mass lesion/mass effect. Small remote left occipital cortex infarct, see coronal reformats. Remote perforator infarct at the left basal ganglia. Age congruent brain volume. Vascular: No hyperdense vessel or unexpected calcification. Skull: Normal. Negative for fracture or focal lesion. Sinuses/Orbits: No acute finding. IMPRESSION: 1. No acute finding or change from 2021. 2. 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 Electronically Signed   By: Monte Fantasia M.D.   On: 11/06/2020 11:10   DG Chest Portable 1 View  Result Date: 11/06/2020 CLINICAL DATA:  Chest pain EXAM: PORTABLE CHEST 1 VIEW COMPARISON:  02/19/2020 FINDINGS: Normal heart size and mediastinal contours. CABG. No acute infiltrate or edema. No effusion or pneumothorax. No acute osseous findings. IMPRESSION: No evidence of active disease. Electronically Signed   By: Monte Fantasia M.D.   On: 11/06/2020 10:33    Procedures Procedures   Medications Ordered in ED Medications  aspirin chewable tablet 324 mg (324 mg Oral Given 11/06/20 1024)    ED Course  I have reviewed the triage vital signs and the nursing notes.  Pertinent labs & imaging results that were available during my care of the patient were reviewed by me and considered in my medical decision making  (see chart for details).    MDM Rules/Calculators/A&P                          70 yo F with a chief complaints of chest pain.  This feels like when she had a heart attack in the past resulted in a syncopal event.  EKG without concerning finding.  Obtain a CT of the head and she feels like she likely struck her head blood work discussed with cardiology.  The patients results and plan were reviewed and discussed.   Any x-rays performed were independently reviewed by myself.   Differential diagnosis were considered with the presenting HPI.  Medications  aspirin chewable tablet 324 mg (324 mg Oral Given 11/06/20 1024)    Vitals:   11/06/20 1100 11/06/20 1146 11/06/20 1230 11/06/20 1400  BP: (!) 161/65 (!) 176/61 (!) 162/89 (!) 149/70  Pulse: (!) 57 61 62 61  Resp: 16 18 16 15   Temp:      TempSrc:      SpO2: 100% 99% 98% 98%  Weight:      Height:        Final diagnoses:  Angina pectoris ([place])    Admission/ observation were discussed with the admitting physician, patient and/or family and they are comfortable with the plan.   Final Clinical Impression(s) / ED Diagnoses Final diagnoses:  Angina pectoris [facility])    Rx / DC Orders ED Discharge Orders     None        Deno Etienne, DO 11/06/20 1502

## 2020-11-06 NOTE — ED Triage Notes (Signed)
Pt bib [facility] from home with complaints of syncope this am. Pt reports going outside to feed her animals then got dizzy and diaphoretic, pt sat down and then states she woke up on the ground. No head injury noted. Pt took one expired nitro after passing out and fire gave pt another nitro.   EMS vitals; 140/80 BP, 60 HR NSR, 100% RA, 97.2 T, 127 CBG

## 2020-11-07 ENCOUNTER — Observation Stay (HOSPITAL_COMMUNITY): Payer: Medicare Other

## 2020-11-07 ENCOUNTER — Inpatient Hospital Stay (HOSPITAL_COMMUNITY): Payer: Medicare Other

## 2020-11-07 DIAGNOSIS — I951 Orthostatic hypotension: Secondary | ICD-10-CM | POA: Diagnosis present

## 2020-11-07 DIAGNOSIS — N179 Acute kidney failure, unspecified: Secondary | ICD-10-CM | POA: Diagnosis not present

## 2020-11-07 DIAGNOSIS — I4892 Unspecified atrial flutter: Secondary | ICD-10-CM | POA: Diagnosis present

## 2020-11-07 DIAGNOSIS — E785 Hyperlipidemia, unspecified: Secondary | ICD-10-CM | POA: Diagnosis present

## 2020-11-07 DIAGNOSIS — Z79899 Other long term (current) drug therapy: Secondary | ICD-10-CM | POA: Diagnosis not present

## 2020-11-07 DIAGNOSIS — I10 Essential (primary) hypertension: Secondary | ICD-10-CM | POA: Diagnosis present

## 2020-11-07 DIAGNOSIS — I6522 Occlusion and stenosis of left carotid artery: Secondary | ICD-10-CM | POA: Diagnosis present

## 2020-11-07 DIAGNOSIS — E1159 Type 2 diabetes mellitus with other circulatory complications: Secondary | ICD-10-CM | POA: Diagnosis not present

## 2020-11-07 DIAGNOSIS — Z801 Family history of malignant neoplasm of trachea, bronchus and lung: Secondary | ICD-10-CM | POA: Diagnosis not present

## 2020-11-07 DIAGNOSIS — I2699 Other pulmonary embolism without acute cor pulmonale: Secondary | ICD-10-CM | POA: Diagnosis present

## 2020-11-07 DIAGNOSIS — Z951 Presence of aortocoronary bypass graft: Secondary | ICD-10-CM | POA: Diagnosis not present

## 2020-11-07 DIAGNOSIS — Z20822 Contact with and (suspected) exposure to covid-19: Secondary | ICD-10-CM | POA: Diagnosis present

## 2020-11-07 DIAGNOSIS — I2510 Atherosclerotic heart disease of native coronary artery without angina pectoris: Secondary | ICD-10-CM | POA: Diagnosis present

## 2020-11-07 DIAGNOSIS — Z7982 Long term (current) use of aspirin: Secondary | ICD-10-CM | POA: Diagnosis not present

## 2020-11-07 DIAGNOSIS — E119 Type 2 diabetes mellitus without complications: Secondary | ICD-10-CM | POA: Diagnosis present

## 2020-11-07 DIAGNOSIS — E1169 Type 2 diabetes mellitus with other specified complication: Secondary | ICD-10-CM | POA: Diagnosis present

## 2020-11-07 DIAGNOSIS — H409 Unspecified glaucoma: Secondary | ICD-10-CM | POA: Diagnosis present

## 2020-11-07 DIAGNOSIS — I2609 Other pulmonary embolism with acute cor pulmonale: Secondary | ICD-10-CM | POA: Diagnosis not present

## 2020-11-07 DIAGNOSIS — R55 Syncope and collapse: Secondary | ICD-10-CM | POA: Diagnosis present

## 2020-11-07 DIAGNOSIS — E039 Hypothyroidism, unspecified: Secondary | ICD-10-CM | POA: Diagnosis present

## 2020-11-07 DIAGNOSIS — I480 Paroxysmal atrial fibrillation: Secondary | ICD-10-CM | POA: Diagnosis present

## 2020-11-07 DIAGNOSIS — Z7984 Long term (current) use of oral hypoglycemic drugs: Secondary | ICD-10-CM | POA: Diagnosis not present

## 2020-11-07 DIAGNOSIS — Z87891 Personal history of nicotine dependence: Secondary | ICD-10-CM | POA: Diagnosis not present

## 2020-11-07 DIAGNOSIS — I2694 Multiple subsegmental pulmonary emboli without acute cor pulmonale: Secondary | ICD-10-CM | POA: Diagnosis not present

## 2020-11-07 DIAGNOSIS — Z79890 Hormone replacement therapy (postmenopausal): Secondary | ICD-10-CM | POA: Diagnosis not present

## 2020-11-07 DIAGNOSIS — Z806 Family history of leukemia: Secondary | ICD-10-CM | POA: Diagnosis not present

## 2020-11-07 DIAGNOSIS — Z8673 Personal history of transient ischemic attack (TIA), and cerebral infarction without residual deficits: Secondary | ICD-10-CM | POA: Diagnosis not present

## 2020-11-07 DIAGNOSIS — Z8249 Family history of ischemic heart disease and other diseases of the circulatory system: Secondary | ICD-10-CM | POA: Diagnosis not present

## 2020-11-07 DIAGNOSIS — D72829 Elevated white blood cell count, unspecified: Secondary | ICD-10-CM | POA: Diagnosis not present

## 2020-11-07 LAB — ECHOCARDIOGRAM COMPLETE
Area-P 1/2: 2.39 cm2
Height: 66 in
S' Lateral: 2.7 cm
Weight: 2433.88 oz

## 2020-11-07 LAB — TSH: TSH: 1.714 u[IU]/mL (ref 0.350–4.500)

## 2020-11-07 LAB — HEMOGLOBIN A1C
Hgb A1c MFr Bld: 8.1 % — ABNORMAL HIGH (ref 4.8–5.6)
Mean Plasma Glucose: 185.77 mg/dL

## 2020-11-07 LAB — GLUCOSE, CAPILLARY
Glucose-Capillary: 194 mg/dL — ABNORMAL HIGH (ref 70–99)
Glucose-Capillary: 216 mg/dL — ABNORMAL HIGH (ref 70–99)
Glucose-Capillary: 271 mg/dL — ABNORMAL HIGH (ref 70–99)

## 2020-11-07 MED ORDER — HYDRALAZINE HCL 20 MG/ML IJ SOLN: 10.0000 mg | Freq: Four times a day (QID) | INTRAMUSCULAR | Status: DC | PRN

## 2020-11-07 MED ORDER — SODIUM CHLORIDE 0.9 % IV SOLN
6.2500 mg | Freq: Four times a day (QID) | INTRAVENOUS | Status: DC | PRN
  Administered 2020-11-07: 6.25 mg via INTRAVENOUS
  Filled 2020-11-07: qty 0.25

## 2020-11-07 MED ORDER — HYDRALAZINE HCL 50 MG PO TABS: 50.0000 mg | ORAL_TABLET | Freq: Three times a day (TID) | ORAL | Status: DC

## 2020-11-07 MED ORDER — HEPARIN BOLUS VIA INFUSION
4000.0000 [IU] | Freq: Once | INTRAVENOUS | Status: AC
  Administered 2020-11-07: 4000 [IU] via INTRAVENOUS
  Filled 2020-11-07: qty 4000

## 2020-11-07 MED ORDER — AMIODARONE HCL IN DEXTROSE 360-4.14 MG/200ML-% IV SOLN
INTRAVENOUS | Status: AC
  Filled 2020-11-07: qty 200

## 2020-11-07 MED ORDER — NITROGLYCERIN IN D5W 200-5 MCG/ML-% IV SOLN
5.0000 ug/min | INTRAVENOUS | Status: DC
  Administered 2020-11-07: 5 ug/min via INTRAVENOUS
  Filled 2020-11-07: qty 250

## 2020-11-07 MED ORDER — IOHEXOL 350 MG/ML SOLN
100.0000 mL | Freq: Once | INTRAVENOUS | Status: AC | PRN
  Administered 2020-11-07: 100 mL via INTRAVENOUS

## 2020-11-07 MED ORDER — AMIODARONE HCL IN DEXTROSE 360-4.14 MG/200ML-% IV SOLN
30.0000 mg/h | INTRAVENOUS | Status: DC
  Administered 2020-11-08 – 2020-11-09 (×2): 30 mg/h via INTRAVENOUS
  Filled 2020-11-07 (×4): qty 200

## 2020-11-07 MED ORDER — SODIUM CHLORIDE 0.9 % IV BOLUS
250.0000 mL | Freq: Once | INTRAVENOUS | Status: AC
  Administered 2020-11-07: 250 mL via INTRAVENOUS

## 2020-11-07 MED ORDER — GADOBUTROL 1 MMOL/ML IV SOLN
6.9000 mL | Freq: Once | INTRAVENOUS | Status: AC | PRN
  Administered 2020-11-07: 6.9 mL via INTRAVENOUS

## 2020-11-07 MED ORDER — AMIODARONE HCL IN DEXTROSE 360-4.14 MG/200ML-% IV SOLN
60.0000 mg/h | INTRAVENOUS | Status: AC
  Administered 2020-11-07: 60 mg/h via INTRAVENOUS

## 2020-11-07 MED ORDER — AMLODIPINE BESYLATE 10 MG PO TABS: 10.0000 mg | ORAL_TABLET | Freq: Every day | ORAL | Status: DC

## 2020-11-07 MED ORDER — AMIODARONE LOAD VIA INFUSION
150.0000 mg | Freq: Once | INTRAVENOUS | Status: AC
  Administered 2020-11-07: 150 mg via INTRAVENOUS

## 2020-11-07 MED ORDER — HEPARIN (PORCINE) 25000 UT/250ML-% IV SOLN
750.0000 [IU]/h | INTRAVENOUS | Status: DC
  Administered 2020-11-07 (×2): 1100 [IU]/h via INTRAVENOUS
  Filled 2020-11-07 (×2): qty 250

## 2020-11-07 NOTE — Progress Notes (Signed)
MRI brain resulted:   IMPRESSION: MRI:   1. Small left posterior parahippocampal focus of DWI hyperintensity (series 3, image 25) without clear ADC correlate, potentially a artifactual or a small acute/subacute infarct. No significant edema or mass effect. 2. 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 3. Moderate chronic microvascular ischemic disease   MRA:   1. No large vessel occlusion or proximal hemodynamically significant stenosis intracranially. 2. In the neck, there is atherosclerosis at the left carotid bifurcation; however, the degree of stenosis does not appear to exceed 50%.   Case d/w Neurology- ok for anticoagulation . MRI brain findings may be artifact. Patient continues to be HTNsive in 200/80s, nausea/vomiting  -> Restart heparin gtt -> Start nitro gtt for BP control, will slowly bring it down to 130s range -> continue to uptitrate BP meds -> f/w neurolgy recs -> close monitoring.  Renae Fickle, MD Cardiology coverage

## 2020-11-07 NOTE — Progress Notes (Addendum)
ANTICOAGULATION CONSULT NOTE - Initial Consult  Pharmacy Consult for apixaban Indication: atrial fibrillation  No Known Allergies  Patient Measurements: Height: 5\' 6"  (167.6 cm) Weight: 69 kg (152 lb 1.9 oz) IBW/kg (Calculated) : 59.3 Heparin dosing weight: 69 kg  Vital Signs: Temp: 98.4 F (36.9 C) (07/10 0725) Temp Source: Oral (07/10 0725) BP: 127/79 (07/10 1058) Pulse Rate: 120 (07/10 1058)  Labs: Recent Labs    11/06/20 0947 11/06/20 1201  HGB 13.1  --   HCT 39.5  --   PLT 320  --   CREATININE 1.19*  --   TROPONINIHS 12 13    Estimated Creatinine Clearance: 40.6 mL/min (A) (by C-G formula based on SCr of 1.19 mg/dL (H)).   Medical History: Past Medical History:  Diagnosis Date   Abdominal bruit 12/12/2019   AKI (acute kidney injury) ([place]) 10/17/2019   Angina pectoris ([place]) 11/20/2017   Barrett's esophagus 09/30/2019   Cancer (H[facility])    Coronary artery disease involving native coronary artery of native heart 12/26/2017   Diabetes mellitus due to underlying condition with unspecified complications ([place]) 07/62/2633   Type 2, on metformin   Dysrhythmia    Essential hypertension 11/20/2017   Ex-smoker 11/20/2017   Glaucoma 09/30/2019   Hyperlipidemia 11/20/2017   Hyperlipidemia associated with type 2 diabetes mellitus ([place]) 10/2017   Hypertension associated with diabetes ([place]) 11/20/2017   Hypothyroidism 10/17/2019   Iliotibial band syndrome of left side 09/09/2015   Lacunar infarction ([place]) 02/19/2020   Palpitations 08/12/2019   Paroxysmal atrial fibrillation ([place]) 10/17/2019   Pleural effusion on left 10/17/2019   Renal calculi 09/30/2019   Skin cancer of arm, left    Syncope 02/19/2020    Medications:  Medications Prior to Admission  Medication Sig Dispense Refill Last Dose   acetaminophen (TYLENOL 8 HOUR) 650 MG CR tablet Take 650 mg by mouth every 8 (eight) hours as needed for pain.   11/05/2020   amLODipine (NORVASC) 10 MG tablet Take 1 tablet (10  mg total) by mouth daily. 30 tablet 6 11/05/2020   aspirin EC 81 MG tablet Take 1 tablet (81 mg total) by mouth daily. 90 tablet 3 11/05/2020   atorvastatin (LIPITOR) 80 MG tablet Take 80 mg by mouth daily.   11/05/2020   ezetimibe (ZETIA) 10 MG tablet Take 1 tablet (10 mg total) by mouth daily. 90 tablet 3 11/05/2020   hydrALAZINE (APRESOLINE) 50 MG tablet Take 1 tablet (50 mg total) by mouth every 8 (eight) hours. 90 tablet 6 11/05/2020   levothyroxine (SYNTHROID, LEVOTHROID) 50 MCG tablet Take 50 mcg by mouth daily before breakfast.   2 11/06/2020   lisinopril (ZESTRIL) 20 MG tablet Take 1 tablet (20 mg total) by mouth daily. 90 tablet 2 11/05/2020   metFORMIN (GLUCOPHAGE) 500 MG tablet Take 500 mg by mouth daily with breakfast.    11/05/2020   nitroGLYCERIN (NITROSTAT) 0.4 MG SL tablet Place 1 tablet (0.4 mg total) under the tongue every 5 (five) minutes as needed for chest pain. 25 tablet 8 11/06/2020 at 6a   Nutritional Supplements (BOOST HIGH PROTEIN PO) Take 1 Can by mouth in the morning and at bedtime.   11/05/2020   omeprazole (PRILOSEC) 40 MG capsule Take 1 capsule (40 mg total) by mouth daily. 90 capsule 2 11/05/2020   PARoxetine (PAXIL) 10 MG tablet Take 10 mg by mouth daily.   11/05/2020    Assessment: 34 yof who presented to the ED with syncope thought also to have Afib. Plan is  for loop recorder outpatient. Pharmacy consulted to dose apixaban for Afib. CHA2DS2-VASc score is 73 (age, female, HTN, DM, stroke, vascular disease).  Patient qualifies for full dose apixaban for age <80, weight >60 kg, and SCr <1.5. No bleeding noted, CBC is normal.  Plan:  Apixaban 5 mg PO bid Education prior to discharge Pharmacy signing off but will continue to follow peripherally - please re-consult if needed  Thank you for involving pharmacy in this patient's care.  Renold Genta, PharmD, BCPS Clinical Pharmacist Clinical phone for 11/07/2020 until 3p is [phone] 11/07/2020 11:12 AM  **Pharmacist phone directory can  be found on [email or web address] listed under [place]**   Addendum: CTA chest with segmental to subsegmental PE in RLL. Pharmacy consulted to begin IV heparin - no apixaban was given, 5000 units SQ heparin given at 06:16.  Heparin 4000 IV bolus Begin heparin drip at 1100 units/hr 8 hr heparin level Daily heparin level, CBC Monitor for s/sx of bleeding F/U transition to oral  Renold Genta, PharmD, BCPS 12:57 PM

## 2020-11-07 NOTE — Progress Notes (Addendum)
   11/07/20 1017  Assess: MEWS Score  Temp 98.4 F (36.9 C)  BP (!) 152/119  Pulse Rate (!) 141  Resp (!) 24  Level of Consciousness Alert  SpO2 100 %  O2 Device Nasal Cannula  O2 Flow Rate (L/min) 4 L/min  Assess: if the MEWS score is Yellow or Red  Were vital signs taken at a resting state? Yes  Focused Assessment Change from prior assessment (see assessment flowsheet)  Early Detection of Sepsis Score *See Row Information* Low  MEWS guidelines implemented *See Row Information* Yes  Treat  MEWS Interventions Escalated (See documentation below);Administered prn meds/treatments  Pain Scale 0-10  Pain Score 0  Patients response to intervention Unchanged  Take Vital Signs  Increase Vital Sign Frequency  Red: Q 1hr X 4 then Q 4hr X 4, if remains red, continue Q 4hrs  Escalate  MEWS: Escalate Red: discuss with charge nurse/RN and provider, consider discussing with RRT  Notify: Charge Nurse/RN  Name of Charge Nurse/RN Notified Jess RN  Date Charge Nurse/RN Notified 11/07/20  Time Charge Nurse/RN Notified 1030  Notify: Provider  Provider Name/Title Caryl Comes MD, Dorene Ar NP  Date Provider Notified 11/07/20  Time Provider Notified 1045  Notification Type Call  Notification Reason Change in status  Provider response At bedside;See new orders  Date of Provider Response 11/07/20  Time of Provider Response 1055  Document  Patient Outcome Not stable and remains on department    See NP note about episode. After NP visit with patient we took pt down to CT. Returned from CT and received orders for Amiodarone gtt to be started. Will continue to monitor pt.

## 2020-11-07 NOTE — Progress Notes (Signed)
+   CTA for PE.  Will begin IV heparin and not eliquis .  Eliquis stopped.

## 2020-11-07 NOTE — Progress Notes (Signed)
MRI results negative, order from MD to start IV nitro drip to lower BP gradually. IV nitro started at this time and ok received from MD to pause IV amio until IV Team starts another IV access. IV heparin will be restarted at that time as well.   Pt stable, handoff given to [employer].

## 2020-11-07 NOTE — Progress Notes (Signed)
*  PRELIMINARY RESULTS* Echocardiogram 2D Echocardiogram has been performed.  Courtney Gardner 11/07/2020, 2:46 PM

## 2020-11-07 NOTE — Consult Note (Signed)
Neurology Consultation Reason for Consult: Acute onset headache, left arm numbness Requesting Physician: Virl Axe  CC: Headache   History is obtained from: patient and chart review  HPI: Courtney Gardner is a 70 y.o. female with a past medical history significant for hypertension, hyperlipidemia, diabetes mellitus, paroxysmal atrial fibrillation (only perioperatively?,  Not on anticoagulation), prior lacunar infarctions, coronary artery disease s/p CABG, left arm skin cancer, hypothyroidism, prior smoking  She was admitted on 7/9 for syncope with telemetry demonstrating short runs of nonsustained atrial fibrillation with secondary atrial flutter.  CT PE protocol was notable for pulmonary emboli earlier today (11:30 AM) with an associated small left pleural effusion and associated atelectasis or consolidation.  She was started on heparin drip at about 1 PM.  At 3:20 PM she was talking with family when she experienced a sudden onset headache, left-sided, associated with nausea and vomiting.  Initially she reported a left-sided numbness in the arm to nursing although this was absent on my examination.  She denies any vision changes to me.  She reported that she frequently gets the same type of headache with elevations in blood pressure, which has been ongoing at least for some months.  Regarding anticoagulation per cardiology notes: "Atrial fibrillation, anticoagulation benefits and potential is explained.  Currently she wants be assessed by monitoring.  She is not keen on anticoagulation.  I do not feel that there is a strong indication for anticoagulation in view of the fact that there has been no recurrence of atrial fibrillation outside the perioperative..  Her monitoring will help [place] understand this better.  She is agreeable on this."  Plan was made for a 30-day event monitor in October 2021, which was negative for atrial fibrillation, and therefore she was not started on anticoagulation  Per  nursing there is also been concern for intermittent medication noncompliance at home  LKW: 3:20 PM tPA given?: No, on heparin drip  IA performed?: No, or if yes, groin puncture time:  Premorbid modified rankin scale: 0-1     0 - No symptoms.     1 - No significant disability. Able to carry out all usual activities, despite some symptoms.   ROS: All other review of systems was negative except as noted in the HPI.   Past Medical History:  Diagnosis Date   Abdominal bruit 12/12/2019   AKI (acute kidney injury) ([place]) 10/17/2019   Angina pectoris ([place]) 11/20/2017   Barrett's esophagus 09/30/2019   Cancer ([place])    Coronary artery disease involving native coronary artery of native heart 12/26/2017   Diabetes mellitus due to underlying condition with unspecified complications ([place]) 02/58/5277   Type 2, on metformin   Dysrhythmia    Essential hypertension 11/20/2017   Ex-smoker 11/20/2017   Glaucoma 09/30/2019   Hyperlipidemia 11/20/2017   Hyperlipidemia associated with type 2 diabetes mellitus ([place]) 10/2017   Hypertension associated with diabetes ([place]) 11/20/2017   Hypothyroidism 10/17/2019   Iliotibial band syndrome of left side 09/09/2015   Lacunar infarction ([place]) 02/19/2020   Palpitations 08/12/2019   Paroxysmal atrial fibrillation ([place]) 10/17/2019   Pleural effusion on left 10/17/2019   Renal calculi 09/30/2019   Skin cancer of arm, left    Syncope 02/19/2020   Current Outpatient Medications  Medication Instructions   acetaminophen (TYLENOL 8 HOUR) 650 mg, Oral, Every 8 hours PRN   amLODipine (NORVASC) 10 mg, Oral, Daily   aspirin EC 81 mg, Oral, Daily   atorvastatin (LIPITOR) 80 mg, Oral, Daily   ezetimibe (  ZETIA) 10 mg, Oral, Daily   hydrALAZINE (APRESOLINE) 50 mg, Oral, Every 8 hours   levothyroxine (SYNTHROID) 50 mcg, Oral, Daily before breakfast   lisinopril (ZESTRIL) 20 mg, Oral, Daily   metFORMIN (GLUCOPHAGE) 500 mg, Oral, Daily with breakfast   nitroGLYCERIN  (NITROSTAT) 0.4 mg, Sublingual, Every 5 min PRN   Nutritional Supplements (BOOST HIGH PROTEIN PO) 1 Can, Oral, 2 times daily   omeprazole (PRILOSEC) 40 mg, Oral, Daily   PARoxetine (PAXIL) 10 mg, Oral, Daily    Current Facility-Administered Medications:    0.9 %  sodium chloride infusion, 250 mL, Intravenous, PRN, Isaiah Serge, NP   acetaminophen (TYLENOL) tablet 650 mg, 650 mg, Oral, Q4H PRN, Isaiah Serge, NP   [COMPLETED] amiodarone (NEXTERONE) 1.8 mg/mL load via infusion 150 mg, 150 mg, Intravenous, Once, 150 mg at 11/07/20 1144 **FOLLOWED BY** amiodarone (NEXTERONE PREMIX) 360-4.14 MG/200ML-% (1.8 mg/mL) IV infusion, 60 mg/hr, Intravenous, Continuous, New Bag at 11/07/20 1144 **FOLLOWED BY** amiodarone (NEXTERONE PREMIX) 360-4.14 MG/200ML-% (1.8 mg/mL) IV infusion, 30 mg/hr, Intravenous, Continuous, Isaiah Serge, NP   [START ON 11/08/2020] amLODipine (NORVASC) tablet 10 mg, 10 mg, Oral, Daily, Cecilie Kicks R, NP   aspirin EC tablet 81 mg, 81 mg, Oral, Daily, Isaiah Serge, NP, 81 mg at 11/07/20 0846   atorvastatin (LIPITOR) tablet 80 mg, 80 mg, Oral, Daily, Cecilie Kicks R, NP, 80 mg at 11/07/20 0846   ezetimibe (ZETIA) tablet 10 mg, 10 mg, Oral, Daily, Cecilie Kicks R, NP, 10 mg at 11/07/20 0846   feeding supplement (BOOST HIGH PROTEIN) liquid 237 mL, 1 Container, Oral, Q24H, Cecilie Kicks R, NP, 237 mL at 11/06/20 2108   heparin ADULT infusion 100 units/mL (25000 units/263mL), 1,100 Units/hr, Intravenous, Continuous, Alvira Philips, [place], Last Rate: 11 mL/hr at 11/07/20 1317, 1,100 Units/hr at 11/07/20 1317   hydrALAZINE (APRESOLINE) tablet 50 mg, 50 mg, Oral, TID, Isaiah Serge, NP   levothyroxine (SYNTHROID) tablet 50 mcg, 50 mcg, Oral, Q0600, Isaiah Serge, NP, 50 mcg at 11/07/20 0617   lisinopril (ZESTRIL) tablet 20 mg, 20 mg, Oral, Daily, Cecilie Kicks R, NP, 20 mg at 11/07/20 0846   nitroGLYCERIN (NITROSTAT) SL tablet 0.4 mg, 0.4 mg, Sublingual, Q5 min PRN, Isaiah Serge, NP   ondansetron [facility]) injection 4 mg, 4 mg, Intravenous, Q6H PRN, Isaiah Serge, NP   pantoprazole (PROTONIX) EC tablet 40 mg, 40 mg, Oral, Daily, Cecilie Kicks R, NP, 40 mg at 11/07/20 0846   PARoxetine (PAXIL) tablet 10 mg, 10 mg, Oral, Daily, Cecilie Kicks R, NP, 10 mg at 11/07/20 0846   sodium chloride flush (NS) 0.9 % injection 3 mL, 3 mL, Intravenous, Q12H, Cecilie Kicks R, NP, 3 mL at 11/07/20 0853   sodium chloride flush (NS) 0.9 % injection 3 mL, 3 mL, Intravenous, PRN, Isaiah Serge, NP   spironolactone (ALDACTONE) tablet 12.5 mg, 12.5 mg, Oral, Daily, Cecilie Kicks R, NP, 12.5 mg at 11/07/20 0846   zolpidem (AMBIEN) tablet 5 mg, 5 mg, Oral, QHS PRN, Isaiah Serge, NP, 5 mg at 11/06/20 2107   Family History  Problem Relation Age of Onset   Leukemia Mother    Lung cancer Father    Hypertension Sister    Heart attack Maternal Grandmother    Heart attack Maternal Grandfather    Heart attack Maternal Uncle     Social History:  reports that she has quit smoking. She has never used smokeless tobacco. She reports previous alcohol use. She  reports that she does not use drugs.  Exam: Current vital signs: BP 114/66 (BP Location: Left Arm)   Pulse 84   Temp 98.3 F (36.8 C) (Oral)   Resp 18   Ht 5\' 6"  (1.676 m)   Wt 69 kg   SpO2 100%   BMI 24.55 kg/m  Vital signs in last 24 hours: Temp:  [97.9 F (36.6 C)-98.6 F (37 C)] 98.3 F (36.8 C) (07/10 1219) Pulse Rate:  [59-141] 84 (07/10 1330) Resp:  [15-24] 18 (07/10 1330) BP: (98-195)/(56-119) 114/66 (07/10 1330) SpO2:  [97 %-100 %] 100 % (07/10 1330) Weight:  [69 kg] 69 kg (07/10 0500)   Physical Exam  Constitutional: Thin but well developed  Psych: Affect appropriate to situation, anxious but cooperative Eyes: No scleral injection HENT: No oropharyngeal obstruction.  MSK: no joint deformities.  Cardiovascular: Normal rate and regular rhythm. Intermittent afib w/ RVR to 150s  Respiratory: Effort  normal, non-labored breathing at rest, SOB w/ minor exertion  GI: Soft.  No distension. There is no tenderness.  Skin: Warm dry and intact visible skin  Neuro: Mental Status Patient is awake, alert, oriented to person, place, month, and situation. Patient is able to give a clear and coherent history. No signs of aphasia or neglect Cranial Nerves: II: Visual Fields are full. Pupils are equal, round, and reactive to light.  III,IV, VI: EOMI without ptosis or diploplia. No more than end-gaze nystgamus  V: Facial sensation is symmetric to temperature and light touch  VII: Facial movement is symmetric. At times subtle right facial drop VIII: hearing is intact to voice X: Uvula elevates symmetrically XI: Shoulder shrug is symmetric. XII: tongue is midline without atrophy or fasciculations.  Motor: Tone is normal. Bulk is normal. 5/5 strength was present throughout BUE, lower extremities deferred due to SOB but no drift on NIH testing Sensory: Sensation is symmetric to light touch and temperature in the arms and legs. (Complained of left arm numbness to nurse initially but resolved on my exam) Deep Tendon Reflexes: 3+ and symmetric in the brachioradialis and patellae.  Plantars: Toes are downgoing bilaterally.  Cerebellar: FNF and HKS are intact bilaterally  NIHSS total 1 --> 0  Score breakdown: 1 for right facial droop initially, 0 on repeat  I have reviewed labs in epic and the results pertinent to this consultation are:  Basic Metabolic Panel: Recent Labs  Lab 11/06/20 0947  NA 136  K 3.9  CL 102  CO2 23  GLUCOSE 212*  BUN 20  CREATININE 1.19*  CALCIUM 9.3  MG 1.9   CBC: Recent Labs  Lab 11/06/20 0947  WBC 11.6*  HGB 13.1  HCT 39.5  MCV 94.7  PLT 320   Coagulation Studies: No results for input(s): LABPROT, INR in the last 72 hours.   Lab Results  Component Value Date   CHOL 201 (H) 06/14/2020   HDL 78 06/14/2020   LDLCALC 109 (H) 06/14/2020   TRIG 79  06/14/2020   CHOLHDL 2.6 06/14/2020   Lab Results  Component Value Date   HGBA1C 8.1 (H) 11/07/2020    I have reviewed the images obtained: Head CT negative for acute intracranial process personally reviewed MRI brain MRA head and neck   Personally reviewed, subtle diffusion restriction noted by radiology without ADC correlate felt to be artifactual in nature as it does not correlate with her symptoms  MRA notable for scattered atherosclerosis without LVO  ECHO 11/07/2020  1. Left ventricular ejection fraction, by estimation,  is 50 to 55%. The  left ventricle has low normal function. The left ventricle has no regional  wall motion abnormalities. There is mild concentric left ventricular  hypertrophy. Left ventricular  diastolic parameters are consistent with Grade I diastolic dysfunction  (impaired relaxation).   2. Right ventricular systolic function is normal. The right ventricular  size is normal. Tricuspid regurgitation signal is inadequate for assessing  PA pressure.   3. The mitral valve is normal in structure. No evidence of mitral valve  regurgitation.   4. The aortic valve is tricuspid. There is mild thickening of the aortic  valve. Aortic valve regurgitation is not visualized. Mild aortic valve  sclerosis is present, with no evidence of aortic valve stenosis.   5. The inferior vena cava is normal in size with greater than 50%  respiratory variability, suggesting right atrial pressure of 3 mmHg.  Comparison(s): No significant change from prior study. Prior images  reviewed side by side.  Note normal biatrial sizes  Impression: This is a 70 year old woman with multiple vascular risk factors as detailed above, especially intermittent atrial fibrillation not yet therapeutic on heparin drip presenting with acute onset headache.  While head CT is reassuringly negative for intracranial hemorrhage, given her severe headache, hypertension and vomiting, prudent to rule out posterior  circulation occlusion.  Obtained MRI brain and MRA head and neck, which is negative for acute infarct as detailed above.  Recommendations: -MRI brain -MRA head and neck -Regarding risk factor optimization and appears she is above LDL goal at 109 and A1c goal at 8.1, appreciate primary team/PCP optimization of these.  She has truly been compliant with her atorvastatin 80 mg nightly, PSK 9 inhibitor should be considered on an outpatient basis -blood pressure control per standard hypertensive emergency parameters, with headache representing endorgan dysfunction -Headache management: Okay to use Tylenol, which patient has not yet been receiving, would avoid NSAIDs in the setting of anticoagulation and opiates given significant risk of rebound headache -Okay to continue anticoagulation   Lesleigh Noe MD-PhD [facility] [phone] Available 7 AM to 7 PM, outside these hours please contact Neurologist on call listed on AMION

## 2020-11-07 NOTE — Code Documentation (Signed)
Code stroke details/hx - Courtney Gardner.   Patient last known well time was 3:15 pm. Family came to nurses station at 3:25 and told nursing she had a severe headache and had started vomitting. Patient was short of breath and diaphoretic, we took VS noting elevated BP and put her on 4L . Upon further neuro assessment around 3:30 pm patient stated her left arm was numb. At This time Code Stroke was initiated, IV Heparin paused and stat head CT ordered.   The patient came in for syncope 7/09. Pertinent patient hx includes HTN, HLD, previous stents, hx of afib not on anticoagulation per pt choice and CAD s/p CABG in 2021. Courtney Gardner became tachycardic this am around 10:30 sustaining aflutter RVR  140's-150's after getting up to use the bathroom with Oletta Darter, RN and cardiology was contacted. We were advised to continue taking pt to CTA and wait 1 hour to see if she converted back to NSR. CTA came back (+) for RLL PE and pt was still aflutter RVR and was started on IV amiodarone.   During bolus of IV amio patient's bp dropped <944 systolic and was paused to contact cardiology. Advised to skip remainder of bolus and give 250 mL NS bolus then resume IV amiodarone once systolic BP > 967. After bolus IV amio was resumed at 33 mL/hr and IV heparin was also ordered and started.   Patient RN Marliss Czar and Oletta Darter accompanied pt to CT where Neurologist administered Stroke Scale at bedside while awaiting CT. After CT patient taken to MRI. RN Marliss Czar stayed in control room with patient. Will await results and continue to monitor.

## 2020-11-07 NOTE — Progress Notes (Signed)
Pt into a flutter HR 140s + SOB 02 added. She was up to BR - she does this or at least similar symptoms at home that usually begin after BR use. Echo pending.  To go to CT for scan.  Will wait an hour and see if breaks, if not will adjust meds.  She does have hx of Brady to 50 in past.  Dr. Caryl Comes aware.

## 2020-11-07 NOTE — Progress Notes (Signed)
Notified on-call cardiologist Felicita Gage of elevated BP 200/80's and persistent vomiting. Order received for phenergan IV piggyback and advised to leave BP high until MRI results come back r/t possible stroke.   At bedside, will continue to monitor.

## 2020-11-07 NOTE — Progress Notes (Addendum)
Patient Name: Courtney Gardner      SUBJECTIVE:Admitted 7/9 for syncope, thought neurally mediated, with hx of CABG and post CABG afib Tel overnight >> recurrent short runs of nonsustained ATrial fib with secondary atrial flutter.  Thromboembolic risk factors ( age -61, HTN-1, DM-1, Vasc disease -1, Gender-1) for a CHADSVASc Score of >=5  Feels better   Past Medical History:  Diagnosis Date   Abdominal bruit 12/12/2019   AKI (acute kidney injury) ([place]) 10/17/2019   Angina pectoris ([place]) 11/20/2017   Barrett's esophagus 09/30/2019   Cancer ([place])    Coronary artery disease involving native coronary artery of native heart 12/26/2017   Diabetes mellitus due to underlying condition with unspecified complications ([place]) 93/81/0175   Type 2, on metformin   Dysrhythmia    Essential hypertension 11/20/2017   Ex-smoker 11/20/2017   Glaucoma 09/30/2019   Hyperlipidemia 11/20/2017   Hyperlipidemia associated with type 2 diabetes mellitus ([place]) 10/2017   Hypertension associated with diabetes ([place]) 11/20/2017   Hypothyroidism 10/17/2019   Iliotibial band syndrome of left side 09/09/2015   Lacunar infarction ([place]) 02/19/2020   Palpitations 08/12/2019   Paroxysmal atrial fibrillation ([facility]) 10/17/2019   Pleural effusion on left 10/17/2019   Renal calculi 09/30/2019   Skin cancer of arm, left    Syncope 02/19/2020    Scheduled Meds:  Scheduled Meds:  amLODipine  10 mg Oral Daily   aspirin EC  81 mg Oral Daily   atorvastatin  80 mg Oral Daily   ezetimibe  10 mg Oral Daily   feeding supplement  1 Container Oral Q24H   heparin  5,000 Units Subcutaneous Q8H   hydrALAZINE  50 mg Oral Q12H   levothyroxine  50 mcg Oral Q0600   lisinopril  20 mg Oral Daily   pantoprazole  40 mg Oral Daily   PARoxetine  10 mg Oral Daily   sodium chloride flush  3 mL Intravenous Q12H   spironolactone  12.5 mg Oral Daily   Continuous Infusions:  sodium chloride     sodium chloride,  acetaminophen, nitroGLYCERIN, ondansetron (ZOFRAN) IV, sodium chloride flush, zolpidem    PHYSICAL EXAM Vitals:   11/06/20 1951 11/07/20 0500 11/07/20 0725 11/07/20 0846  BP: (!) 179/73 134/76 (!) 159/67 (!) 143/56  Pulse: 60 73 70   Resp:  16 18   Temp:  97.9 F (36.6 C) 98.4 F (36.9 C)   TempSrc:  Oral Oral   SpO2: 99% 99% 99%   Weight:  69 kg    Height:        Well developed and nourished in no acute distress HENT normal Neck supple   Clear Regular rate and rhythm, no murmurs or gallops Abd-soft with active BS No Clubbing cyanosis edema Skin-warm and dry A & Oriented  Grossly normal sensory and motor function     TELEMETRY: Reviewed personnally pt in recurrent nonsustained atrial fib>> organizes into flutter and terminates, occ with some sinu slowing:     Intake/Output Summary (Last 24 hours) at 11/07/2020 0912 Last data filed at 11/07/2020 0900 Gross per 24 hour  Intake 480 ml  Output 200 ml  Net 280 ml    LABS: Basic Metabolic Panel: Recent Labs  Lab 11/06/20 0947  NA 136  K 3.9  CL 102  CO2 23  GLUCOSE 212*  BUN 20  CREATININE 1.19*  CALCIUM 9.3  MG 1.9   Cardiac Enzymes: No results for input(s): CKTOTAL, CKMB, CKMBINDEX, TROPONINI in the last  72 hours. CBC: Recent Labs  Lab 11/06/20 0947  WBC 11.6*  HGB 13.1  HCT 39.5  MCV 94.7  PLT 320   PROTIME: No results for input(s): LABPROT, INR in the last 72 hours. Liver Function Tests: No results for input(s): AST, ALT, ALKPHOS, BILITOT, PROT, ALBUMIN in the last 72 hours. No results for input(s): LIPASE, AMYLASE in the last 72 hours. BNP: BNP (last 3 results) No results for input(s): BNP in the last 8760 hours.  ProBNP (last 3 results) No results for input(s): PROBNP in the last 8760 hours.  D-Dimer: Recent Labs    11/06/20 0947  DDIMER 1.39*   Hemoglobin A1C: Recent Labs    11/07/20 0109  HGBA1C 8.1*   Fasting Lipid Panel: No results for input(s): CHOL, HDL, LDLCALC, TRIG,  CHOLHDL, LDLDIRECT in the last 72 hours. Thyroid Function Tests: Recent Labs    11/07/20 0109  TSH 1.714   Anemia Panel: No results for input(s): VITAMINB12, FOLATE, FERRITIN, TIBC, IRON, RETICCTPCT in the last 72 hours.      ASSESSMENT AND PLAN:  Syncope-recurrent  SCAF   CAD with prior CABG   Dyspnea on exertion   Hypertension-poorly controlled   Tachy palpitations  Sinus bradycardia in past prompting discontinuing of BB   Diabetes   Elevated d-dimer  Orthostatic intolerance  Hypotension and tachycardia and rebound orthostatic hypertension    BP  a little better with introduction of aldactone, will need close followup  will increase to 25 daily  can get bmet when she returns week after next for loop   anticoagulation -- with SCAF, ie duration < 12-24 will hold off, esp given long term monitoring via loop  Outpt loop arrange through office ( staff message already sent)   Will need CTA to exclude PE w dyspnea and + ddimer; obvioulsy if positive will change ASA>> Apixoban    With orthostatic hypotension and hypertension, will start her on mestinon 30 bid  Await echo      Signed, Virl Axe MD  11/07/2020

## 2020-11-07 NOTE — Progress Notes (Signed)
Called on Ms. Gowin, she is back in SR but she developed severe headache, she vomited and had lt arm numbness.  Code stroke was called.  She was on way for CT head, I stopped Heparin for now.   Her daughter was present and I explained the PE, atrial flutter, and Heparin. Dr. Caryl Comes is aware. Will resume amlodipine, though she did receive today. Will increase hydralazine to TID.  Will appreciate Neuro eval as well.

## 2020-11-08 ENCOUNTER — Other Ambulatory Visit (HOSPITAL_COMMUNITY): Payer: Self-pay

## 2020-11-08 DIAGNOSIS — I2609 Other pulmonary embolism with acute cor pulmonale: Secondary | ICD-10-CM

## 2020-11-08 DIAGNOSIS — E1159 Type 2 diabetes mellitus with other circulatory complications: Secondary | ICD-10-CM

## 2020-11-08 DIAGNOSIS — I10 Essential (primary) hypertension: Secondary | ICD-10-CM

## 2020-11-08 DIAGNOSIS — D72829 Elevated white blood cell count, unspecified: Secondary | ICD-10-CM

## 2020-11-08 LAB — CBC
HCT: 38.3 % (ref 36.0–46.0)
Hemoglobin: 13.3 g/dL (ref 12.0–15.0)
MCH: 31.5 pg (ref 26.0–34.0)
MCHC: 34.7 g/dL (ref 30.0–36.0)
MCV: 90.8 fL (ref 80.0–100.0)
Platelets: 363 10*3/uL (ref 150–400)
RBC: 4.22 MIL/uL (ref 3.87–5.11)
RDW: 13.1 % (ref 11.5–15.5)
WBC: 17.5 10*3/uL — ABNORMAL HIGH (ref 4.0–10.5)
nRBC: 0 % (ref 0.0–0.2)

## 2020-11-08 LAB — HEPARIN LEVEL (UNFRACTIONATED)
Heparin Unfractionated: 0.91 IU/mL — ABNORMAL HIGH (ref 0.30–0.70)
Heparin Unfractionated: 1.08 IU/mL — ABNORMAL HIGH (ref 0.30–0.70)

## 2020-11-08 LAB — BASIC METABOLIC PANEL
Anion gap: 12 (ref 5–15)
BUN: 24 mg/dL — ABNORMAL HIGH (ref 8–23)
CO2: 22 mmol/L (ref 22–32)
Calcium: 9.3 mg/dL (ref 8.9–10.3)
Chloride: 104 mmol/L (ref 98–111)
Creatinine, Ser: 1.58 mg/dL — ABNORMAL HIGH (ref 0.44–1.00)
GFR, Estimated: 35 mL/min — ABNORMAL LOW (ref >60)
Glucose, Bld: 147 mg/dL — ABNORMAL HIGH (ref 70–99)
Potassium: 3.1 mmol/L — ABNORMAL LOW (ref 3.5–5.1)
Sodium: 138 mmol/L (ref 135–145)

## 2020-11-08 LAB — GLUCOSE, CAPILLARY
Glucose-Capillary: 298 mg/dL — ABNORMAL HIGH (ref 70–99)
Glucose-Capillary: 197 mg/dL — ABNORMAL HIGH (ref 70–99)
Glucose-Capillary: 127 mg/dL — ABNORMAL HIGH (ref 70–99)
Glucose-Capillary: 216 mg/dL — ABNORMAL HIGH (ref 70–99)

## 2020-11-08 LAB — MAGNESIUM: Magnesium: 1.8 mg/dL (ref 1.7–2.4)

## 2020-11-08 MED ORDER — ENSURE ENLIVE PO LIQD
237.0000 mL | ORAL | Status: DC
  Administered 2020-11-08: 237 mL via ORAL
  Filled 2020-11-08: qty 237

## 2020-11-08 MED ORDER — INSULIN ASPART 100 UNIT/ML IJ SOLN
0.0000 [IU] | Freq: Three times a day (TID) | INTRAMUSCULAR | Status: DC
  Administered 2020-11-08: 2 [IU] via SUBCUTANEOUS
  Administered 2020-11-08 – 2020-11-09 (×2): 3 [IU] via SUBCUTANEOUS
  Administered 2020-11-09: 5 [IU] via SUBCUTANEOUS
  Administered 2020-11-09 – 2020-11-10 (×2): 2 [IU] via SUBCUTANEOUS

## 2020-11-08 MED ORDER — MAGNESIUM SULFATE 2 GM/50ML IV SOLN
2.0000 g | Freq: Once | INTRAVENOUS | Status: AC
  Administered 2020-11-08: 2 g via INTRAVENOUS
  Filled 2020-11-08: qty 50

## 2020-11-08 MED ORDER — POTASSIUM CHLORIDE CRYS ER 20 MEQ PO TBCR: 40.0000 meq | EXTENDED_RELEASE_TABLET | Freq: Two times a day (BID) | ORAL | Status: DC

## 2020-11-08 MED ORDER — POTASSIUM CHLORIDE CRYS ER 10 MEQ PO TBCR
60.0000 meq | EXTENDED_RELEASE_TABLET | Freq: Once | ORAL | Status: AC
  Administered 2020-11-08: 60 meq via ORAL
  Filled 2020-11-08: qty 6

## 2020-11-08 MED ORDER — HYDRALAZINE HCL 25 MG PO TABS
25.0000 mg | ORAL_TABLET | Freq: Three times a day (TID) | ORAL | Status: DC
  Administered 2020-11-08 (×2): 25 mg via ORAL
  Filled 2020-11-08 (×3): qty 1

## 2020-11-08 MED ORDER — POTASSIUM CHLORIDE CRYS ER 20 MEQ PO TBCR: 60.0000 meq | EXTENDED_RELEASE_TABLET | Freq: Once | ORAL | Status: DC

## 2020-11-08 NOTE — Plan of Care (Signed)
  Problem: Education: Goal: Knowledge of General Education information will improve Description Including pain rating scale, medication(s)/side effects and non-pharmacologic comfort measures Outcome: Progressing   

## 2020-11-08 NOTE — Progress Notes (Signed)
Pt nitroglycerin gtt off. BP currently maintaining 210 systolic off of drip, most recent being 109/48 (65). Pt denies N/V, dizziness. Amio gtt at 16.7, Heparin gtt at 11.  Rudi Rummage, MD made aware.

## 2020-11-08 NOTE — Progress Notes (Signed)
ANTICOAGULATION CONSULT NOTE Follow Up Consult  Pharmacy Consult for apixaban > heparin  Indication: atrial fibrillation  No Known Allergies  Patient Measurements: Height: 5\' 6"  (167.6 cm) Weight: 70.5 kg (155 lb 6.8 oz) IBW/kg (Calculated) : 59.3 Heparin dosing weight: 69 kg  Vital Signs: Temp: 98.9 F (37.2 C) (07/11 1143) Temp Source: Oral (07/11 1143) BP: 117/51 (07/11 1143) Pulse Rate: 61 (07/11 1143)  Labs: Recent Labs    11/06/20 0947 11/06/20 1201 11/08/20 0205 11/08/20 1210  HGB 13.1  --  13.3  --   HCT 39.5  --  38.3  --   PLT 320  --  363  --   HEPARINUNFRC  --   --  0.91* 1.08*  CREATININE 1.19*  --   --  1.58*  TROPONINIHS 12 13  --   --      Estimated Creatinine Clearance: 30.6 mL/min (A) (by C-G formula based on SCr of 1.58 mg/dL (H)).   Medical History: Past Medical History:  Diagnosis Date   Abdominal bruit 12/12/2019   AKI (acute kidney injury) ([place]) 10/17/2019   Angina pectoris ([place]) 11/20/2017   Barrett's esophagus 09/30/2019   Cancer ([facility])    Coronary artery disease involving native coronary artery of native heart 12/26/2017   Diabetes mellitus due to underlying condition with unspecified complications ([place]) 67/34/1938   Type 2, on metformin   Dysrhythmia    Essential hypertension 11/20/2017   Ex-smoker 11/20/2017   Glaucoma 09/30/2019   Hyperlipidemia 11/20/2017   Hyperlipidemia associated with type 2 diabetes mellitus ([place]) 10/2017   Hypertension associated with diabetes ([place]) 11/20/2017   Hypothyroidism 10/17/2019   Iliotibial band syndrome of left side 09/09/2015   Lacunar infarction ([place]) 02/19/2020   Palpitations 08/12/2019   Paroxysmal atrial fibrillation ([place]) 10/17/2019   Pleural effusion on left 10/17/2019   Renal calculi 09/30/2019   Skin cancer of arm, left    Syncope 02/19/2020    Medications:  Medications Prior to Admission  Medication Sig Dispense Refill Last Dose   acetaminophen (TYLENOL 8 HOUR) 650 MG CR  tablet Take 650 mg by mouth every 8 (eight) hours as needed for pain.   11/05/2020   amLODipine (NORVASC) 10 MG tablet Take 1 tablet (10 mg total) by mouth daily. 30 tablet 6 11/05/2020   aspirin EC 81 MG tablet Take 1 tablet (81 mg total) by mouth daily. 90 tablet 3 11/05/2020   atorvastatin (LIPITOR) 80 MG tablet Take 80 mg by mouth daily.   11/05/2020   ezetimibe (ZETIA) 10 MG tablet Take 1 tablet (10 mg total) by mouth daily. 90 tablet 3 11/05/2020   hydrALAZINE (APRESOLINE) 50 MG tablet Take 1 tablet (50 mg total) by mouth every 8 (eight) hours. 90 tablet 6 11/05/2020   levothyroxine (SYNTHROID, LEVOTHROID) 50 MCG tablet Take 50 mcg by mouth daily before breakfast.   2 11/06/2020   lisinopril (ZESTRIL) 20 MG tablet Take 1 tablet (20 mg total) by mouth daily. 90 tablet 2 11/05/2020   metFORMIN (GLUCOPHAGE) 500 MG tablet Take 500 mg by mouth daily with breakfast.    11/05/2020   nitroGLYCERIN (NITROSTAT) 0.4 MG SL tablet Place 1 tablet (0.4 mg total) under the tongue every 5 (five) minutes as needed for chest pain. 25 tablet 8 11/06/2020 at 6a   Nutritional Supplements (BOOST HIGH PROTEIN PO) Take 1 Can by mouth in the morning and at bedtime.   11/05/2020   omeprazole (PRILOSEC) 40 MG capsule Take 1 capsule (40 mg total) by mouth daily.  90 capsule 2 11/05/2020   PARoxetine (PAXIL) 10 MG tablet Take 10 mg by mouth daily.   11/05/2020    Assessment: 43 yof who presented to the ED with syncope thought also to have Afib. Plan is for loop recorder outpatient. Pharmacy consulted to dose apixaban for Afib. CHA2DS2-VASc score is 76 (age, female, HTN, DM, stroke, vascular disease).  Patient qualifies for full dose apixaban for age <80, weight >60 kg, and SCr <1.5. No bleeding noted, CBC is normal.  Patient with Afib > SR on amiodarone drip  CTA chest with segmental to subsegmental PE in RLL. Pharmacy consulted to begin IV heparin - no apixaban was given Heparin drip  decreased this morning to 900 uts/hr heparin level remeains  elevated 1 drawn near heparin infusion - no bleeding  Re-entered lab to ensure drawn from opposite arm as heparin infusion  Plan:  Decrease heparin 750 uts/hr  Daily heparin level and CBC  Consider change to apixaban tomorrow if no procedures planned    Bonnita Nasuti Pharm.D. CPP, BCPS Clinical Pharmacist [phone] 11/08/2020 3:23 PM   **Pharmacist phone directory can be found on [email or web address] listed under [place]**

## 2020-11-08 NOTE — TOC Benefit Eligibility Note (Addendum)
Patient Teacher, English as a foreign language completed.    The patient is currently admitted and upon discharge could be taking Eliquis 5 mg.  The current 30 day co-pay is, $0.00.   The patient is currently admitted and upon discharge could be taking Farxiga 10 mg.  The current 30 day co-pay is, $0.00.   The patient is currently admitted and upon discharge could be taking Jardiance 10 mg.  The current 30 day co-pay is, $0.00.   The patient is insured through [place] [employer]   Lyndel Safe, [place] Patient Advocate Specialist [place] Team Direct Number: [phone]  Fax: [phone]

## 2020-11-08 NOTE — Progress Notes (Signed)
ANTICOAGULATION CONSULT NOTE - Follow Up Consult  Pharmacy Consult for heparin Indication:  PE and Afib in setting of possible CVA   Labs: Recent Labs    11/06/20 0947 11/06/20 1201 11/08/20 0205  HGB 13.1  --  13.3  HCT 39.5  --  38.3  PLT 320  --  363  HEPARINUNFRC  --   --  0.91*  CREATININE 1.19*  --   --   TROPONINIHS 12 13  --     Assessment: 70yo female supratherapeutic on heparin with initial dosing for Afib and PE, now also being evaluated for CVA; no gtt issues or signs of bleeding per RN.  Goal of Therapy:  Heparin level 0.3-0.5 units/ml   Plan:  Will decrease heparin gtt by 3 units/kg/hr to 900 units/hr and check level in 8 hours.    Wynona Neat, PharmD, BCPS  11/08/2020,4:01 AM

## 2020-11-08 NOTE — Plan of Care (Signed)
Based on chart review, given patient has improved with BP control, suspect symptoms were secondary to hypertensive emergency. No further inpatient neuro workup at this time, neurology will be available on an as needed basis. Please page if new questions arise.   Lesleigh Noe MD-PhD [facility] [phone]  Available 7 AM to 7 PM, outside these hours please contact Neurologist on call listed on AMION

## 2020-11-08 NOTE — Progress Notes (Addendum)
Progress Note  Patient Name: CHAMARA DYCK Date of Encounter: 11/08/2020  [facility] Cardiologist: Jenean Lindau, MD   Subjective   Feeling much better this morning. No headache, n/v or chest pain.   Inpatient Medications    Scheduled Meds:  amLODipine  10 mg Oral Daily   aspirin EC  81 mg Oral Daily   atorvastatin  80 mg Oral Daily   ezetimibe  10 mg Oral Daily   feeding supplement  1 Container Oral Q24H   hydrALAZINE  50 mg Oral TID   insulin aspart  0-15 Units Subcutaneous TID WC   levothyroxine  50 mcg Oral Q0600   lisinopril  20 mg Oral Daily   pantoprazole  40 mg Oral Daily   PARoxetine  10 mg Oral Daily   sodium chloride flush  3 mL Intravenous Q12H   spironolactone  12.5 mg Oral Daily   Continuous Infusions:  sodium chloride     amiodarone Stopped (11/07/20 1925)   heparin 900 Units/hr (11/08/20 0402)   nitroGLYCERIN Stopped (11/08/20 0303)   promethazine (PHENERGAN) injection (IM or IVPB) 6.25 mg (11/07/20 1823)   PRN Meds: sodium chloride, acetaminophen, hydrALAZINE, nitroGLYCERIN, ondansetron (ZOFRAN) IV, promethazine (PHENERGAN) injection (IM or IVPB), sodium chloride flush, zolpidem   Vital Signs    Vitals:   11/08/20 0457 11/08/20 0518 11/08/20 0548 11/08/20 0700  BP:  123/63 125/61 (!) 97/46  Pulse:    62  Resp:    12  Temp:    99 F (37.2 C)  TempSrc:    Oral  SpO2:      Weight: 70.5 kg     Height:        Intake/Output Summary (Last 24 hours) at 11/08/2020 0909 Last data filed at 11/07/2020 1328 Gross per 24 hour  Intake 490 ml  Output --  Net 490 ml   Last 3 Weights 11/08/2020 11/07/2020 11/06/2020  Weight (lbs) 155 lb 6.8 oz 152 lb 1.9 oz 152 lb 1.9 oz  Weight (kg) 70.5 kg 69 kg 69 kg      Telemetry    AFib RVR--> conversion to SR rates 60s `- Personally Reviewed  ECG    No new tracing this morning.  Physical Exam   GEN: No acute distress.   Neck: No JVD Cardiac: RRR, no murmurs, rubs, or gallops.  Respiratory: Clear  to auscultation bilaterally. GI: Soft, nontender, non-distended  MS: No edema; No deformity. Neuro:  Nonfocal  Psych: Normal affect   Labs    High Sensitivity Troponin:   Recent Labs  Lab 11/06/20 0947 11/06/20 1201  TROPONINIHS 12 13      Chemistry Recent Labs  Lab 11/06/20 0947  NA 136  K 3.9  CL 102  CO2 23  GLUCOSE 212*  BUN 20  CREATININE 1.19*  CALCIUM 9.3  GFRNONAA 49*  ANIONGAP 11     Hematology Recent Labs  Lab 11/06/20 0947 11/08/20 0205  WBC 11.6* 17.5*  RBC 4.17 4.22  HGB 13.1 13.3  HCT 39.5 38.3  MCV 94.7 90.8  MCH 31.4 31.5  MCHC 33.2 34.7  RDW 13.1 13.1  PLT 320 363    BNPNo results for input(s): BNP, PROBNP in the last 168 hours.   DDimer  Recent Labs  Lab 11/06/20 0947  DDIMER 1.39*     Radiology    CT Head Wo Contrast  Result Date: 11/06/2020 CLINICAL DATA:  Minor head trauma.  Syncope this morning EXAM: CT HEAD WITHOUT CONTRAST TECHNIQUE: Contiguous  axial images were obtained from the base of the skull through the vertex without intravenous contrast. COMPARISON:  02/19/2020 FINDINGS: Brain: No evidence of acute infarction, hemorrhage, hydrocephalus, extra-axial collection or mass lesion/mass effect. Small remote left occipital cortex infarct, see coronal reformats. Remote perforator infarct at the left basal ganglia. Age congruent brain volume. Vascular: No hyperdense vessel or unexpected calcification. Skull: Normal. Negative for fracture or focal lesion. Sinuses/Orbits: No acute finding. IMPRESSION: 1. No acute finding or change from 2021. 2. 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 Electronically Signed   By: Monte Fantasia M.D.   On: 11/06/2020 11:10   CT Angio Chest Pulmonary Embolism (PE) W or WO Contrast  Result Date: 11/07/2020 CLINICAL DATA:  PE suspected, positive D-dimer EXAM: CT ANGIOGRAPHY CHEST WITH CONTRAST TECHNIQUE: Multidetector CT imaging of the chest was performed using the standard protocol  during bolus administration of intravenous contrast. Multiplanar CT image reconstructions and MIPs were obtained to evaluate the vascular anatomy. CONTRAST:  182mL OMNIPAQUE IOHEXOL 350 MG/ML SOLN COMPARISON:  10/17/2019 FINDINGS: Cardiovascular: Satisfactory opacification of the pulmonary arteries to the segmental level. Positive examination for pulmonary embolism with segmental to subsegmental embolus present in the right lower lobe (series 7, image 190). Normal heart size. Three-vessel coronary artery calcifications. No pericardial effusion. Scattered aortic atherosclerosis. Mediastinum/Nodes: No enlarged mediastinal, hilar, or axillary lymph nodes. Thyroid gland, trachea, and esophagus demonstrate no significant findings. Lungs/Pleura: Lungs are clear. Small left pleural effusion and associated atelectasis or consolidation. Upper Abdomen: No acute abnormality. Musculoskeletal: No chest wall abnormality. No acute or significant osseous findings. Review of the MIP images confirms the above findings. IMPRESSION: 1. Positive examination for pulmonary embolism with segmental to subsegmental embolus present in the right lower lobe. 2. Small left pleural effusion and associated atelectasis or consolidation. 3. Coronary artery disease. Call report request was placed at the time of dictation; final communication will be documented. Aortic Atherosclerosis (ICD10-I70.0). Electronically Signed   By: Eddie Candle M.D.   On: 11/07/2020 11:54   MR ANGIO HEAD WO CONTRAST  Addendum Date: 11/07/2020   ADDENDUM REPORT: 11/07/2020 22:38 ADDENDUM: On further review, there is multifocal moderate stenosis of the right P2 PCA. This addendum was discussed with Dr. Lorrin Goodell via telephone at 10:37 p.m. Electronically Signed   By: Margaretha Sheffield MD   On: 11/07/2020 22:38   Result Date: 11/07/2020 CLINICAL DATA:  Neuro deficit, acute stroke suspected EXAM: MRI HEAD WITHOUT CONTRAST MRA HEAD WITHOUT CONTRAST MRA NECK WITHOUT AND  WITH CONTRAST TECHNIQUE: Multiplanar, multi-echo pulse sequences of the brain and surrounding structures were acquired without intravenous contrast. Angiographic images of the Circle of Willis were acquired using MRA technique without intravenous contrast. Angiographic images of the neck were acquired using MRA technique without and with intravenous contrast. Carotid stenosis measurements (when applicable) are obtained utilizing NASCET criteria, using the distal internal carotid diameter as the denominator. CONTRAST:  6.71mL GADAVIST GADOBUTROL 1 MMOL/ML IV SOLN COMPARISON:  Same day CT head. FINDINGS: MRI HEAD FINDINGS Brain: Small left posterior parahippocampal focus of DWI hyperintensity (series 3, image 25) without clear ADC correlate, potentially a small acute/subacute infarct versus artifact. Remote infarct in the left basal ganglia. Moderate scattered T2/FLAIR hyperintensities within the white matter, likely related to chronic microvascular ischemic disease. No hydrocephalus, acute hemorrhage, extra-axial fluid collection, mass lesion, or midline shift. Vascular: See below. Skull and upper cervical spine: Normal marrow signal. Sinuses/Orbits: No acute findings. Other: No mastoid effusions. MRA HEAD FINDINGS Anterior circulation: Bilateral ICAs, MCAs,  and ACAs are patent without proximal hemodynamically significant stenosis. No aneurysm identified. Posterior circulation: Bilateral intradural vertebral arteries are patent. The basilar artery and bilateral posterior cerebral arteries are patent without proximal hemodynamically significant stenosis. MRA NECK FINDINGS Aortic arch: Great vessel origins are patent. No significant (greater than 50%) stenosis. Right carotid system: No significant (greater than 50%) stenosis. Left carotid system: There is atherosclerotic narrowing at the carotid bifurcation without stenosis, although the degree of stenosis does not appear to exceed 50% in comparison to the distal  vessel. Vertebral arteries: Bilateral vertebral arteries are patent without evidence of greater than 50% stenosis. IMPRESSION: MRI: 1. Small left posterior parahippocampal focus of DWI hyperintensity (series 3, image 25) without clear ADC correlate, potentially a artifactual or a small acute/subacute infarct. No significant edema or mass effect. 2. 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 3. Moderate chronic microvascular ischemic disease MRA: 1. No large vessel occlusion or proximal hemodynamically significant stenosis intracranially. 2. In the neck, there is atherosclerosis at the left carotid bifurcation; however, the degree of stenosis does not appear to exceed 50%. Electronically Signed: By: Margaretha Sheffield MD On: 11/07/2020 18:39   MR ANGIO NECK W WO CONTRAST  Addendum Date: 11/07/2020   ADDENDUM REPORT: 11/07/2020 22:38 ADDENDUM: On further review, there is multifocal moderate stenosis of the right P2 PCA. This addendum was discussed with Dr. Lorrin Goodell via telephone at 10:37 p.m. Electronically Signed   By: Margaretha Sheffield MD   On: 11/07/2020 22:38   Result Date: 11/07/2020 CLINICAL DATA:  Neuro deficit, acute stroke suspected EXAM: MRI HEAD WITHOUT CONTRAST MRA HEAD WITHOUT CONTRAST MRA NECK WITHOUT AND WITH CONTRAST TECHNIQUE: Multiplanar, multi-echo pulse sequences of the brain and surrounding structures were acquired without intravenous contrast. Angiographic images of the Circle of Willis were acquired using MRA technique without intravenous contrast. Angiographic images of the neck were acquired using MRA technique without and with intravenous contrast. Carotid stenosis measurements (when applicable) are obtained utilizing NASCET criteria, using the distal internal carotid diameter as the denominator. CONTRAST:  6.41mL GADAVIST GADOBUTROL 1 MMOL/ML IV SOLN COMPARISON:  Same day CT head. FINDINGS: MRI HEAD FINDINGS Brain: Small left posterior parahippocampal focus of DWI  hyperintensity (series 3, image 25) without clear ADC correlate, potentially a small acute/subacute infarct versus artifact. Remote infarct in the left basal ganglia. Moderate scattered T2/FLAIR hyperintensities within the white matter, likely related to chronic microvascular ischemic disease. No hydrocephalus, acute hemorrhage, extra-axial fluid collection, mass lesion, or midline shift. Vascular: See below. Skull and upper cervical spine: Normal marrow signal. Sinuses/Orbits: No acute findings. Other: No mastoid effusions. MRA HEAD FINDINGS Anterior circulation: Bilateral ICAs, MCAs, and ACAs are patent without proximal hemodynamically significant stenosis. No aneurysm identified. Posterior circulation: Bilateral intradural vertebral arteries are patent. The basilar artery and bilateral posterior cerebral arteries are patent without proximal hemodynamically significant stenosis. MRA NECK FINDINGS Aortic arch: Great vessel origins are patent. No significant (greater than 50%) stenosis. Right carotid system: No significant (greater than 50%) stenosis. Left carotid system: There is atherosclerotic narrowing at the carotid bifurcation without stenosis, although the degree of stenosis does not appear to exceed 50% in comparison to the distal vessel. Vertebral arteries: Bilateral vertebral arteries are patent without evidence of greater than 50% stenosis. IMPRESSION: MRI: 1. Small left posterior parahippocampal focus of DWI hyperintensity (series 3, image 25) without clear ADC correlate, potentially a artifactual or a small acute/subacute infarct. No significant edema or mass effect. 2. 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  3. Moderate chronic microvascular ischemic disease MRA: 1. No large vessel occlusion or proximal hemodynamically significant stenosis intracranially. 2. In the neck, there is atherosclerosis at the left carotid bifurcation; however, the degree of stenosis does not appear to exceed  50%. Electronically Signed: By: Margaretha Sheffield MD On: 11/07/2020 18:39   MR BRAIN WO CONTRAST  Addendum Date: 11/07/2020   ADDENDUM REPORT: 11/07/2020 22:38 ADDENDUM: On further review, there is multifocal moderate stenosis of the right P2 PCA. This addendum was discussed with Dr. Lorrin Goodell via telephone at 10:37 p.m. Electronically Signed   By: Margaretha Sheffield MD   On: 11/07/2020 22:38   Result Date: 11/07/2020 CLINICAL DATA:  Neuro deficit, acute stroke suspected EXAM: MRI HEAD WITHOUT CONTRAST MRA HEAD WITHOUT CONTRAST MRA NECK WITHOUT AND WITH CONTRAST TECHNIQUE: Multiplanar, multi-echo pulse sequences of the brain and surrounding structures were acquired without intravenous contrast. Angiographic images of the Circle of Willis were acquired using MRA technique without intravenous contrast. Angiographic images of the neck were acquired using MRA technique without and with intravenous contrast. Carotid stenosis measurements (when applicable) are obtained utilizing NASCET criteria, using the distal internal carotid diameter as the denominator. CONTRAST:  6.12mL GADAVIST GADOBUTROL 1 MMOL/ML IV SOLN COMPARISON:  Same day CT head. FINDINGS: MRI HEAD FINDINGS Brain: Small left posterior parahippocampal focus of DWI hyperintensity (series 3, image 25) without clear ADC correlate, potentially a small acute/subacute infarct versus artifact. Remote infarct in the left basal ganglia. Moderate scattered T2/FLAIR hyperintensities within the white matter, likely related to chronic microvascular ischemic disease. No hydrocephalus, acute hemorrhage, extra-axial fluid collection, mass lesion, or midline shift. Vascular: See below. Skull and upper cervical spine: Normal marrow signal. Sinuses/Orbits: No acute findings. Other: No mastoid effusions. MRA HEAD FINDINGS Anterior circulation: Bilateral ICAs, MCAs, and ACAs are patent without proximal hemodynamically significant stenosis. No aneurysm identified. Posterior  circulation: Bilateral intradural vertebral arteries are patent. The basilar artery and bilateral posterior cerebral arteries are patent without proximal hemodynamically significant stenosis. MRA NECK FINDINGS Aortic arch: Great vessel origins are patent. No significant (greater than 50%) stenosis. Right carotid system: No significant (greater than 50%) stenosis. Left carotid system: There is atherosclerotic narrowing at the carotid bifurcation without stenosis, although the degree of stenosis does not appear to exceed 50% in comparison to the distal vessel. Vertebral arteries: Bilateral vertebral arteries are patent without evidence of greater than 50% stenosis. IMPRESSION: MRI: 1. Small left posterior parahippocampal focus of DWI hyperintensity (series 3, image 25) without clear ADC correlate, potentially a artifactual or a small acute/subacute infarct. No significant edema or mass effect. 2. 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 3. Moderate chronic microvascular ischemic disease MRA: 1. No large vessel occlusion or proximal hemodynamically significant stenosis intracranially. 2. In the neck, there is atherosclerosis at the left carotid bifurcation; however, the degree of stenosis does not appear to exceed 50%. Electronically Signed: By: Margaretha Sheffield MD On: 11/07/2020 18:39   DG Chest Portable 1 View  Result Date: 11/06/2020 CLINICAL DATA:  Chest pain EXAM: PORTABLE CHEST 1 VIEW COMPARISON:  02/19/2020 FINDINGS: Normal heart size and mediastinal contours. CABG. No acute infiltrate or edema. No effusion or pneumothorax. No acute osseous findings. IMPRESSION: No evidence of active disease. Electronically Signed   By: Monte Fantasia M.D.   On: 11/06/2020 10:33   ECHOCARDIOGRAM COMPLETE  Result Date: 11/07/2020    ECHOCARDIOGRAM REPORT   Patient Name:   LEIMOMI ZERVAS Mcgrory Date of Exam: 11/07/2020 Medical Rec #:  [id number]  Height:       66.0 in Accession #:    [id number]     Weight:        152.1 lb Date of Birth:  04-22-1951      BSA:          1.780 m Patient Age:    37 years       BP:           125/64 mmHg Patient Gender: F              HR:           79 bpm. Exam Location:  Inpatient Procedure: 2D Echo, Cardiac Doppler and Color Doppler Indications:    Syncope R55  History:        Patient has prior history of Echocardiogram examinations, most                 recent 09/11/2019. CAD, Arrythmias:Atrial Fibrillation; Risk                 Factors:Hypertension, Diabetes, Dyslipidemia and Former Smoker.  Sonographer:    Alvino Chapel RCS Referring Phys: [place]  1. Left ventricular ejection fraction, by estimation, is 50 to 55%. The left ventricle has low normal function. The left ventricle has no regional wall motion abnormalities. There is mild concentric left ventricular hypertrophy. Left ventricular diastolic parameters are consistent with Grade I diastolic dysfunction (impaired relaxation).  2. Right ventricular systolic function is normal. The right ventricular size is normal. Tricuspid regurgitation signal is inadequate for assessing PA pressure.  3. The mitral valve is normal in structure. No evidence of mitral valve regurgitation.  4. The aortic valve is tricuspid. There is mild thickening of the aortic valve. Aortic valve regurgitation is not visualized. Mild aortic valve sclerosis is present, with no evidence of aortic valve stenosis.  5. The inferior vena cava is normal in size with greater than 50% respiratory variability, suggesting right atrial pressure of 3 mmHg. Comparison(s): No significant change from prior study. Prior images reviewed side by side. FINDINGS  Left Ventricle: Left ventricular ejection fraction, by estimation, is 50 to 55%. The left ventricle has low normal function. The left ventricle has no regional wall motion abnormalities. The left ventricular internal cavity size was normal in size. There is mild concentric left ventricular hypertrophy. Abnormal  (paradoxical) septal motion consistent with post-operative status. Left ventricular diastolic parameters are consistent with Grade I diastolic dysfunction (impaired relaxation). Indeterminate filling pressures. Right Ventricle: The right ventricular size is normal. No increase in right ventricular wall thickness. Right ventricular systolic function is normal. Tricuspid regurgitation signal is inadequate for assessing PA pressure. Left Atrium: Left atrial size was normal in size. Right Atrium: Right atrial size was normal in size. Pericardium: There is no evidence of pericardial effusion. Mitral Valve: The mitral valve is normal in structure. No evidence of mitral valve regurgitation. Tricuspid Valve: The tricuspid valve is grossly normal. Tricuspid valve regurgitation is not demonstrated. Aortic Valve: The aortic valve is tricuspid. There is mild thickening of the aortic valve. Aortic valve regurgitation is not visualized. Mild aortic valve sclerosis is present, with no evidence of aortic valve stenosis. Pulmonic Valve: The pulmonic valve was grossly normal. Pulmonic valve regurgitation is not visualized. Aorta: The aortic root is normal in size and structure. Venous: The inferior vena cava is normal in size with greater than 50% respiratory variability, suggesting right atrial pressure of 3 mmHg. IAS/Shunts: No atrial level shunt detected by  color flow Doppler.  LEFT VENTRICLE PLAX 2D LVIDd:         3.70 cm  Diastology LVIDs:         2.70 cm  LV e' medial:    3.92 cm/s LV PW:         1.30 cm  LV E/e' medial:  14.2 LV IVS:        1.30 cm  LV e' lateral:   6.09 cm/s LVOT diam:     2.00 cm  LV E/e' lateral: 9.1 LV SV:         57 LV SV Index:   32 LVOT Area:     3.14 cm  RIGHT VENTRICLE RV S prime:     8.16 cm/s TAPSE (M-mode): 1.5 cm LEFT ATRIUM             Index       RIGHT ATRIUM           Index LA diam:        3.30 cm 1.85 cm/m  RA Area:     10.90 cm LA Vol (A2C):   44.5 ml 25.00 ml/m RA Volume:   23.80 ml   13.37 ml/m LA Vol (A4C):   39.2 ml 22.02 ml/m LA Biplane Vol: 43.8 ml 24.60 ml/m  AORTIC VALVE LVOT Vmax:   93.50 cm/s LVOT Vmean:  59.500 cm/s LVOT VTI:    0.182 m  AORTA Ao Root diam: 3.10 cm MITRAL VALVE MV Area (PHT): 2.39 cm    SHUNTS MV Decel Time: 317 msec    Systemic VTI:  0.18 m MV E velocity: 55.60 cm/s  Systemic Diam: 2.00 cm MV A velocity: 89.80 cm/s MV E/A ratio:  0.62 Mihai Croitoru MD Electronically signed by Sanda Klein MD Signature Date/Time: 11/07/2020/3:15:30 PM    Final    CT HEAD CODE STROKE WO CONTRAST`  Result Date: 11/07/2020 CLINICAL DATA:  Code stroke.  Stroke suspected. EXAM: CT HEAD WITHOUT CONTRAST TECHNIQUE: Contiguous axial images were obtained from the base of the skull through the vertex without intravenous contrast. COMPARISON:  CT head 11/06/2020. FINDINGS: Brain: No evidence of acute large vascular territory infarction, acute hemorrhage, hydrocephalus, extra-axial collection or mass lesion/mass effect. 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 Vascular: The vasculature is mildly opacified, likely residual contrast from same day CTA chest. This limits evaluation for hyperdense vessel. Calcific atherosclerosis. Skull: No acute fracture. Sinuses/Orbits: Sinuses are largely clear. Other: No mastoid effusions. ASPECTS [facility] Stroke Program Early CT Score) Total score (0-10 with 10 being normal): 10. IMPRESSION: 1. No evidence of acute intracranial abnormality. ASPECTS is 10. 2. 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 Code stroke imaging results were communicated on 11/07/2020 at 4:23 pm to provider Dr. Donnita Falls Via telephone, who verbally acknowledged these results. Electronically Signed   By: Margaretha Sheffield MD   On: 11/07/2020 16:26    Cardiac Studies   Echo: 11/07/20: EF 50 to 55%.  Concentric LVH GR 1 DD.  No R WMA.  Normal RV size and function.  Unable assess pressures.  Mild aortic sclerosis no stenosis.   Normal CVP.   Patient Profile     70 y.o. female with PMH of 2v CABG, paroxysmal afib, HTN, HLD, hypothyroidism, DM who presented with syncope/afib and found to have PE.   Assessment & Plan    Syncope: Initially planned for outpatient loop recorder, but subsequently found to have PE.  No recurrent episodes during  admission.  Paroxysmal atrial fibrillation/flutter: Initially converted to sinus rhythm and then went back into atrial fibrillation.  She was started on IV amiodarone and has converted to sinus rhythm with rates in the 60s this morning. -- will continue IV amiodarone and heparin for now with plans to transition to oral dosing tomorrow. -- CHA2DS2-VASc Score of at least 4. --> with clearly documented evidence of flutter here in the hospital, to reason for full anticoagulation.  PE: CT angio with positive finding of pulmonary embolus with segmental to subsegmental embolus present in the right lower lobe. --Currently on IV heparin with plans to transition to Eliquis dosing tomorrow  Hypertension: Developed significantly elevated blood pressures yesterday requiring the use of IV nitro along with oral antihypertensives.  Subsequently developed some hypotensive readings this morning and IV nitro was DC'd. --Scale back her antihypertensive medication regimen this morning, DC amlodipine, hold spiro and lisinopril for now. Resume at BP tolerates -- reduce hydralazine to 25mg  TID  Headache/left-sided numbness: Code stroke called yesterday afternoon.  Neurology evaluated.  MRI was negative.  -- Cleared to continue anticoagulation --As above we will adjust BP medication regimen and resume as tolerated  Hyperlipidemia: PTA meds include atorvastatin 80 mg daily and Zetia 10 mg daily --Check lipids in a.m.  Diabetes: Hemoglobin A1c 8.1 -- SSI --PTA meds metformin 500 mg daily --Suspect would be a good candidate for SGLT2  Hypothyroidism: TSH 1.7 --Continue Synthroid  Leukocytosis: WBC  11.6>>17.5. Suspect reactive. Afebrile. CXR negative on admission -- CBC in am  For questions or updates, please contact [place] Please consult [email or web address] for contact info under        Signed, Reino Bellis, NP  11/08/2020, 9:09 AM      ATTENDING ATTESTATION  I have seen, examined and evaluated the patient this morning along with Reino Bellis, NP-C.  After reviewing all the available data and chart, we discussed the patients laboratory, study & physical findings as well as symptoms in detail. I agree with her findings, examination as well as impression recommendations as per our discussion.    Attending adjustments noted in italics.   Likely cause for her syncope was between PE and atrial fibrillation/flutter we have 2 reasons for anticoagulation.  Convert IV heparin to Eliquis tomorrow. Neurologically more stable today.  Appreciate neurology input. Blood pressures responded dramatically to increased oral antihypertensives along with nitroglycerin infusion.  Medications be held for now, will restart once she rebounds. Hopefully a plan will be to stabilize her blood pressure regimen tomorrow, convert to oral anticoagulation and consider discharge home in the next 1-2 days.    Glenetta Hew, M.D., M.S. Interventional Cardiologist   Pager # [phone] Phone # [phone] [address] [place], [place] [address]

## 2020-11-09 LAB — BASIC METABOLIC PANEL
Anion gap: 7 (ref 5–15)
BUN: 26 mg/dL — ABNORMAL HIGH (ref 8–23)
CO2: 22 mmol/L (ref 22–32)
Calcium: 9.3 mg/dL (ref 8.9–10.3)
Chloride: 107 mmol/L (ref 98–111)
Creatinine, Ser: 1.47 mg/dL — ABNORMAL HIGH (ref 0.44–1.00)
GFR, Estimated: 38 mL/min — ABNORMAL LOW (ref >60)
Glucose, Bld: 169 mg/dL — ABNORMAL HIGH (ref 70–99)
Potassium: 4 mmol/L (ref 3.5–5.1)
Sodium: 136 mmol/L (ref 135–145)

## 2020-11-09 LAB — CBC
HCT: 38.2 % (ref 36.0–46.0)
Hemoglobin: 13.2 g/dL (ref 12.0–15.0)
MCH: 31.4 pg (ref 26.0–34.0)
MCHC: 34.6 g/dL (ref 30.0–36.0)
MCV: 91 fL (ref 80.0–100.0)
Platelets: 346 10*3/uL (ref 150–400)
RBC: 4.2 MIL/uL (ref 3.87–5.11)
RDW: 13.2 % (ref 11.5–15.5)
WBC: 16.4 10*3/uL — ABNORMAL HIGH (ref 4.0–10.5)
nRBC: 0 % (ref 0.0–0.2)

## 2020-11-09 LAB — LIPID PANEL
Cholesterol: 186 mg/dL (ref 0–200)
HDL: 62 mg/dL (ref >40)
LDL Cholesterol: 104 mg/dL — ABNORMAL HIGH (ref 0–99)
Total CHOL/HDL Ratio: 3 RATIO
Triglycerides: 100 mg/dL (ref <150)
VLDL: 20 mg/dL (ref 0–40)

## 2020-11-09 LAB — GLUCOSE, CAPILLARY
Glucose-Capillary: 219 mg/dL — ABNORMAL HIGH (ref 70–99)
Glucose-Capillary: 178 mg/dL — ABNORMAL HIGH (ref 70–99)
Glucose-Capillary: 129 mg/dL — ABNORMAL HIGH (ref 70–99)
Glucose-Capillary: 138 mg/dL — ABNORMAL HIGH (ref 70–99)

## 2020-11-09 LAB — HEPARIN LEVEL (UNFRACTIONATED): Heparin Unfractionated: 0.69 IU/mL (ref 0.30–0.70)

## 2020-11-09 MED ORDER — SODIUM CHLORIDE 0.9 % IV SOLN: INTRAVENOUS | Status: AC

## 2020-11-09 MED ORDER — APIXABAN 5 MG PO TABS
10.0000 mg | ORAL_TABLET | Freq: Two times a day (BID) | ORAL | Status: DC
  Administered 2020-11-09 – 2020-11-10 (×3): 10 mg via ORAL
  Filled 2020-11-09 (×3): qty 2

## 2020-11-09 MED ORDER — HYDRALAZINE HCL 50 MG PO TABS
50.0000 mg | ORAL_TABLET | Freq: Three times a day (TID) | ORAL | Status: DC
  Administered 2020-11-09 – 2020-11-10 (×4): 50 mg via ORAL
  Filled 2020-11-09 (×3): qty 1

## 2020-11-09 MED ORDER — AMIODARONE HCL 200 MG PO TABS
200.0000 mg | ORAL_TABLET | Freq: Two times a day (BID) | ORAL | Status: DC
  Administered 2020-11-09 – 2020-11-10 (×3): 200 mg via ORAL
  Filled 2020-11-09 (×3): qty 1

## 2020-11-09 MED ORDER — APIXABAN 5 MG PO TABS: 5.0000 mg | ORAL_TABLET | Freq: Two times a day (BID) | ORAL | Status: DC

## 2020-11-09 NOTE — Discharge Instructions (Signed)
Information on my medicine - ELIQUIS (apixaban)   Why was Eliquis prescribed for you? Eliquis was prescribed to treat blood clots that may have been found in the veins of your legs (deep vein thrombosis) or in your lungs (pulmonary embolism) and to reduce the risk of them occurring again.  What do You need to know about Eliquis ? The starting dose is 10 mg (two 5 mg tablets) taken TWICE daily for the FIRST SEVEN (7) DAYS, then on 11/16/20  the dose is reduced to ONE 5 mg tablet taken TWICE daily.  Eliquis may be taken with or without food.   Try to take the dose about the same time in the morning and in the evening. If you have difficulty swallowing the tablet whole please discuss with your pharmacist how to take the medication safely.  Take Eliquis exactly as prescribed and DO NOT stop taking Eliquis without talking to the doctor who prescribed the medication.  Stopping may increase your risk of developing a new blood clot.  Refill your prescription before you run out.  After discharge, you should have regular check-up appointments with your healthcare provider that is prescribing your Eliquis.    What do you do if you miss a dose? If a dose of ELIQUIS is not taken at the scheduled time, take it as soon as possible on the same day and twice-daily administration should be resumed. The dose should not be doubled to make up for a missed dose.  Important Safety Information A possible side effect of Eliquis is bleeding. You should call your healthcare provider right away if you experience any of the following: Bleeding from an injury or your nose that does not stop. Unusual colored urine (red or dark brown) or unusual colored stools (red or black). Unusual bruising for unknown reasons. A serious fall or if you hit your head (even if there is no bleeding).  Some medicines may interact with Eliquis and might increase your risk of bleeding or clotting while on Eliquis. To help avoid  this, consult your healthcare provider or pharmacist prior to using any new prescription or non-prescription medications, including herbals, vitamins, non-steroidal anti-inflammatory drugs (NSAIDs) and supplements.  This website has more information on Eliquis (apixaban): http://[email or web address].com/eliquis/home

## 2020-11-09 NOTE — Progress Notes (Addendum)
Progress Note  Patient Name: Courtney Gardner Date of Encounter: 11/09/2020  [facility] Cardiologist: Jenean Lindau, MD   Subjective   Doing better, still with little appetite. No more headache or n/v  Inpatient Medications    Scheduled Meds:  aspirin EC  81 mg Oral Daily   atorvastatin  80 mg Oral Daily   ezetimibe  10 mg Oral Daily   feeding supplement  237 mL Oral Q24H   hydrALAZINE  25 mg Oral TID   insulin aspart  0-15 Units Subcutaneous TID WC   levothyroxine  50 mcg Oral Q0600   pantoprazole  40 mg Oral Daily   PARoxetine  10 mg Oral Daily   sodium chloride flush  3 mL Intravenous Q12H   Continuous Infusions:  sodium chloride     amiodarone 30 mg/hr (11/09/20 0501)   heparin 750 Units/hr (11/08/20 1643)   nitroGLYCERIN Stopped (11/08/20 0303)   promethazine (PHENERGAN) injection (IM or IVPB) 6.25 mg (11/07/20 1823)   PRN Meds: sodium chloride, acetaminophen, hydrALAZINE, nitroGLYCERIN, ondansetron (ZOFRAN) IV, promethazine (PHENERGAN) injection (IM or IVPB), sodium chloride flush, zolpidem   Vital Signs    Vitals:   11/08/20 1800 11/08/20 1900 11/08/20 2019 11/09/20 0500  BP: (!) 120/55 (!) 132/59  (!) 144/57  Pulse:   76 (!) 55  Resp:   18 18  Temp:   98.7 F (37.1 C) 98.5 F (36.9 C)  TempSrc:   Oral Oral  SpO2:   95% 96%  Weight:      Height:        Intake/Output Summary (Last 24 hours) at 11/09/2020 0911 Last data filed at 11/09/2020 0600 Gross per 24 hour  Intake 416.99 ml  Output 0 ml  Net 416.99 ml   Last 3 Weights 11/08/2020 11/07/2020 11/06/2020  Weight (lbs) 155 lb 6.8 oz 152 lb 1.9 oz 152 lb 1.9 oz  Weight (kg) 70.5 kg 69 kg 69 kg      Telemetry    SB rates 50s - Personally Reviewed  ECG    No new tracing  Physical Exam   GEN: No acute distress.  Resting comfortably in bed. Neck: No JVD Cardiac: RRR, no murmurs, rubs, or gallops.  Respiratory: Clear to auscultation bilaterally. GI: Soft, nontender, non-distended  MS: No  edema; No deformity. Neuro:  Nonfocal  Psych: Normal affect   Labs    High Sensitivity Troponin:   Recent Labs  Lab 11/06/20 0947 11/06/20 1201  TROPONINIHS 12 13      Chemistry Recent Labs  Lab 11/06/20 0947 11/08/20 1210 11/09/20 0027  NA 136 138 136  K 3.9 3.1* 4.0  CL 102 104 107  CO2 23 22 22   GLUCOSE 212* 147* 169*  BUN 20 24* 26*  CREATININE 1.19* 1.58* 1.47*  CALCIUM 9.3 9.3 9.3  GFRNONAA 49* 35* 38*  ANIONGAP 11 12 7      Hematology Recent Labs  Lab 11/06/20 0947 11/08/20 0205 11/09/20 0027  WBC 11.6* 17.5* 16.4*  RBC 4.17 4.22 4.20  HGB 13.1 13.3 13.2  HCT 39.5 38.3 38.2  MCV 94.7 90.8 91.0  MCH 31.4 31.5 31.4  MCHC 33.2 34.7 34.6  RDW 13.1 13.1 13.2  PLT 320 363 346    BNPNo results for input(s): BNP, PROBNP in the last 168 hours.   DDimer  Recent Labs  Lab 11/06/20 0947  DDIMER 1.39*     Radiology    CT Angio Chest Pulmonary Embolism (PE) W or WO Contrast  Result Date: 11/07/2020 CLINICAL DATA:  PE suspected, positive D-dimer EXAM: CT ANGIOGRAPHY CHEST WITH CONTRAST TECHNIQUE: Multidetector CT imaging of the chest was performed using the standard protocol during bolus administration of intravenous contrast. Multiplanar CT image reconstructions and MIPs were obtained to evaluate the vascular anatomy. CONTRAST:  138mL OMNIPAQUE IOHEXOL 350 MG/ML SOLN COMPARISON:  10/17/2019 FINDINGS: Cardiovascular: Satisfactory opacification of the pulmonary arteries to the segmental level. Positive examination for pulmonary embolism with segmental to subsegmental embolus present in the right lower lobe (series 7, image 190). Normal heart size. Three-vessel coronary artery calcifications. No pericardial effusion. Scattered aortic atherosclerosis. Mediastinum/Nodes: No enlarged mediastinal, hilar, or axillary lymph nodes. Thyroid gland, trachea, and esophagus demonstrate no significant findings. Lungs/Pleura: Lungs are clear. Small left pleural effusion and  associated atelectasis or consolidation. Upper Abdomen: No acute abnormality. Musculoskeletal: No chest wall abnormality. No acute or significant osseous findings. Review of the MIP images confirms the above findings. IMPRESSION: 1. Positive examination for pulmonary embolism with segmental to subsegmental embolus present in the right lower lobe. 2. Small left pleural effusion and associated atelectasis or consolidation. 3. Coronary artery disease. Call report request was placed at the time of dictation; final communication will be documented. Aortic Atherosclerosis (ICD10-I70.0). Electronically Signed   By: Eddie Candle M.D.   On: 11/07/2020 11:54   MR ANGIO HEAD WO CONTRAST  Addendum Date: 11/07/2020   ADDENDUM REPORT: 11/07/2020 22:38 ADDENDUM: On further review, there is multifocal moderate stenosis of the right P2 PCA. This addendum was discussed with Dr. Lorrin Goodell via telephone at 10:37 p.m. Electronically Signed   By: Margaretha Sheffield MD   On: 11/07/2020 22:38   Result Date: 11/07/2020 CLINICAL DATA:  Neuro deficit, acute stroke suspected EXAM: MRI HEAD WITHOUT CONTRAST MRA HEAD WITHOUT CONTRAST MRA NECK WITHOUT AND WITH CONTRAST TECHNIQUE: Multiplanar, multi-echo pulse sequences of the brain and surrounding structures were acquired without intravenous contrast. Angiographic images of the Circle of Willis were acquired using MRA technique without intravenous contrast. Angiographic images of the neck were acquired using MRA technique without and with intravenous contrast. Carotid stenosis measurements (when applicable) are obtained utilizing NASCET criteria, using the distal internal carotid diameter as the denominator. CONTRAST:  6.21mL GADAVIST GADOBUTROL 1 MMOL/ML IV SOLN COMPARISON:  Same day CT head. FINDINGS: MRI HEAD FINDINGS Brain: Small left posterior parahippocampal focus of DWI hyperintensity (series 3, image 25) without clear ADC correlate, potentially a small acute/subacute infarct versus  artifact. Remote infarct in the left basal ganglia. Moderate scattered T2/FLAIR hyperintensities within the white matter, likely related to chronic microvascular ischemic disease. No hydrocephalus, acute hemorrhage, extra-axial fluid collection, mass lesion, or midline shift. Vascular: See below. Skull and upper cervical spine: Normal marrow signal. Sinuses/Orbits: No acute findings. Other: No mastoid effusions. MRA HEAD FINDINGS Anterior circulation: Bilateral ICAs, MCAs, and ACAs are patent without proximal hemodynamically significant stenosis. No aneurysm identified. Posterior circulation: Bilateral intradural vertebral arteries are patent. The basilar artery and bilateral posterior cerebral arteries are patent without proximal hemodynamically significant stenosis. MRA NECK FINDINGS Aortic arch: Great vessel origins are patent. No significant (greater than 50%) stenosis. Right carotid system: No significant (greater than 50%) stenosis. Left carotid system: There is atherosclerotic narrowing at the carotid bifurcation without stenosis, although the degree of stenosis does not appear to exceed 50% in comparison to the distal vessel. Vertebral arteries: Bilateral vertebral arteries are patent without evidence of greater than 50% stenosis. IMPRESSION: MRI: 1. Small left posterior parahippocampal focus of DWI hyperintensity (series 3, image 25) without  clear ADC correlate, potentially a artifactual or a small acute/subacute infarct. No significant edema or mass effect. 2. 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 3. Moderate chronic microvascular ischemic disease MRA: 1. No large vessel occlusion or proximal hemodynamically significant stenosis intracranially. 2. In the neck, there is atherosclerosis at the left carotid bifurcation; however, the degree of stenosis does not appear to exceed 50%. Electronically Signed: By: Margaretha Sheffield MD On: 11/07/2020 18:39   MR ANGIO NECK W WO CONTRAST  Addendum  Date: 11/07/2020   ADDENDUM REPORT: 11/07/2020 22:38 ADDENDUM: On further review, there is multifocal moderate stenosis of the right P2 PCA. This addendum was discussed with Dr. Lorrin Goodell via telephone at 10:37 p.m. Electronically Signed   By: Margaretha Sheffield MD   On: 11/07/2020 22:38   Result Date: 11/07/2020 CLINICAL DATA:  Neuro deficit, acute stroke suspected EXAM: MRI HEAD WITHOUT CONTRAST MRA HEAD WITHOUT CONTRAST MRA NECK WITHOUT AND WITH CONTRAST TECHNIQUE: Multiplanar, multi-echo pulse sequences of the brain and surrounding structures were acquired without intravenous contrast. Angiographic images of the Circle of Willis were acquired using MRA technique without intravenous contrast. Angiographic images of the neck were acquired using MRA technique without and with intravenous contrast. Carotid stenosis measurements (when applicable) are obtained utilizing NASCET criteria, using the distal internal carotid diameter as the denominator. CONTRAST:  6.4mL GADAVIST GADOBUTROL 1 MMOL/ML IV SOLN COMPARISON:  Same day CT head. FINDINGS: MRI HEAD FINDINGS Brain: Small left posterior parahippocampal focus of DWI hyperintensity (series 3, image 25) without clear ADC correlate, potentially a small acute/subacute infarct versus artifact. Remote infarct in the left basal ganglia. Moderate scattered T2/FLAIR hyperintensities within the white matter, likely related to chronic microvascular ischemic disease. No hydrocephalus, acute hemorrhage, extra-axial fluid collection, mass lesion, or midline shift. Vascular: See below. Skull and upper cervical spine: Normal marrow signal. Sinuses/Orbits: No acute findings. Other: No mastoid effusions. MRA HEAD FINDINGS Anterior circulation: Bilateral ICAs, MCAs, and ACAs are patent without proximal hemodynamically significant stenosis. No aneurysm identified. Posterior circulation: Bilateral intradural vertebral arteries are patent. The basilar artery and bilateral posterior  cerebral arteries are patent without proximal hemodynamically significant stenosis. MRA NECK FINDINGS Aortic arch: Great vessel origins are patent. No significant (greater than 50%) stenosis. Right carotid system: No significant (greater than 50%) stenosis. Left carotid system: There is atherosclerotic narrowing at the carotid bifurcation without stenosis, although the degree of stenosis does not appear to exceed 50% in comparison to the distal vessel. Vertebral arteries: Bilateral vertebral arteries are patent without evidence of greater than 50% stenosis. IMPRESSION: MRI: 1. Small left posterior parahippocampal focus of DWI hyperintensity (series 3, image 25) without clear ADC correlate, potentially a artifactual or a small acute/subacute infarct. No significant edema or mass effect. 2. 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 3. Moderate chronic microvascular ischemic disease MRA: 1. No large vessel occlusion or proximal hemodynamically significant stenosis intracranially. 2. In the neck, there is atherosclerosis at the left carotid bifurcation; however, the degree of stenosis does not appear to exceed 50%. Electronically Signed: By: Margaretha Sheffield MD On: 11/07/2020 18:39   MR BRAIN WO CONTRAST  Addendum Date: 11/07/2020   ADDENDUM REPORT: 11/07/2020 22:38 ADDENDUM: On further review, there is multifocal moderate stenosis of the right P2 PCA. This addendum was discussed with Dr. Lorrin Goodell via telephone at 10:37 p.m. Electronically Signed   By: Margaretha Sheffield MD   On: 11/07/2020 22:38   Result Date: 11/07/2020 CLINICAL DATA:  Neuro deficit, acute stroke suspected  EXAM: MRI HEAD WITHOUT CONTRAST MRA HEAD WITHOUT CONTRAST MRA NECK WITHOUT AND WITH CONTRAST TECHNIQUE: Multiplanar, multi-echo pulse sequences of the brain and surrounding structures were acquired without intravenous contrast. Angiographic images of the Circle of Willis were acquired using MRA technique without intravenous  contrast. Angiographic images of the neck were acquired using MRA technique without and with intravenous contrast. Carotid stenosis measurements (when applicable) are obtained utilizing NASCET criteria, using the distal internal carotid diameter as the denominator. CONTRAST:  6.66mL GADAVIST GADOBUTROL 1 MMOL/ML IV SOLN COMPARISON:  Same day CT head. FINDINGS: MRI HEAD FINDINGS Brain: Small left posterior parahippocampal focus of DWI hyperintensity (series 3, image 25) without clear ADC correlate, potentially a small acute/subacute infarct versus artifact. Remote infarct in the left basal ganglia. Moderate scattered T2/FLAIR hyperintensities within the white matter, likely related to chronic microvascular ischemic disease. No hydrocephalus, acute hemorrhage, extra-axial fluid collection, mass lesion, or midline shift. Vascular: See below. Skull and upper cervical spine: Normal marrow signal. Sinuses/Orbits: No acute findings. Other: No mastoid effusions. MRA HEAD FINDINGS Anterior circulation: Bilateral ICAs, MCAs, and ACAs are patent without proximal hemodynamically significant stenosis. No aneurysm identified. Posterior circulation: Bilateral intradural vertebral arteries are patent. The basilar artery and bilateral posterior cerebral arteries are patent without proximal hemodynamically significant stenosis. MRA NECK FINDINGS Aortic arch: Great vessel origins are patent. No significant (greater than 50%) stenosis. Right carotid system: No significant (greater than 50%) stenosis. Left carotid system: There is atherosclerotic narrowing at the carotid bifurcation without stenosis, although the degree of stenosis does not appear to exceed 50% in comparison to the distal vessel. Vertebral arteries: Bilateral vertebral arteries are patent without evidence of greater than 50% stenosis. IMPRESSION: MRI: 1. Small left posterior parahippocampal focus of DWI hyperintensity (series 3, image 25) without clear ADC correlate,  potentially a artifactual or a small acute/subacute infarct. No significant edema or mass effect. 2. 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 3. Moderate chronic microvascular ischemic disease MRA: 1. No large vessel occlusion or proximal hemodynamically significant stenosis intracranially. 2. In the neck, there is atherosclerosis at the left carotid bifurcation; however, the degree of stenosis does not appear to exceed 50%. Electronically Signed: By: Margaretha Sheffield MD On: 11/07/2020 18:39   ECHOCARDIOGRAM COMPLETE  Result Date: 11/07/2020    ECHOCARDIOGRAM REPORT   Patient Name:   Courtney Gardner Date of Exam: 11/07/2020 Medical Rec #:  [id number]      Height:       66.0 in Accession #:    [id number]     Weight:       152.1 lb Date of Birth:  11-Feb-1951      BSA:          1.780 m Patient Age:    70 years       BP:           125/64 mmHg Patient Gender: F              HR:           79 bpm. Exam Location:  Inpatient Procedure: 2D Echo, Cardiac Doppler and Color Doppler Indications:    Syncope R55  History:        Patient has prior history of Echocardiogram examinations, most                 recent 09/11/2019. CAD, Arrythmias:Atrial Fibrillation; Risk                 Factors:Hypertension, Diabetes, Dyslipidemia  and Former Smoker.  Sonographer:    Alvino Chapel RCS Referring Phys: [place]  1. Left ventricular ejection fraction, by estimation, is 50 to 55%. The left ventricle has low normal function. The left ventricle has no regional wall motion abnormalities. There is mild concentric left ventricular hypertrophy. Left ventricular diastolic parameters are consistent with Grade I diastolic dysfunction (impaired relaxation).  2. Right ventricular systolic function is normal. The right ventricular size is normal. Tricuspid regurgitation signal is inadequate for assessing PA pressure.  3. The mitral valve is normal in structure. No evidence of mitral valve regurgitation.  4. The  aortic valve is tricuspid. There is mild thickening of the aortic valve. Aortic valve regurgitation is not visualized. Mild aortic valve sclerosis is present, with no evidence of aortic valve stenosis.  5. The inferior vena cava is normal in size with greater than 50% respiratory variability, suggesting right atrial pressure of 3 mmHg. Comparison(s): No significant change from prior study. Prior images reviewed side by side. FINDINGS  Left Ventricle: Left ventricular ejection fraction, by estimation, is 50 to 55%. The left ventricle has low normal function. The left ventricle has no regional wall motion abnormalities. The left ventricular internal cavity size was normal in size. There is mild concentric left ventricular hypertrophy. Abnormal (paradoxical) septal motion consistent with post-operative status. Left ventricular diastolic parameters are consistent with Grade I diastolic dysfunction (impaired relaxation). Indeterminate filling pressures. Right Ventricle: The right ventricular size is normal. No increase in right ventricular wall thickness. Right ventricular systolic function is normal. Tricuspid regurgitation signal is inadequate for assessing PA pressure. Left Atrium: Left atrial size was normal in size. Right Atrium: Right atrial size was normal in size. Pericardium: There is no evidence of pericardial effusion. Mitral Valve: The mitral valve is normal in structure. No evidence of mitral valve regurgitation. Tricuspid Valve: The tricuspid valve is grossly normal. Tricuspid valve regurgitation is not demonstrated. Aortic Valve: The aortic valve is tricuspid. There is mild thickening of the aortic valve. Aortic valve regurgitation is not visualized. Mild aortic valve sclerosis is present, with no evidence of aortic valve stenosis. Pulmonic Valve: The pulmonic valve was grossly normal. Pulmonic valve regurgitation is not visualized. Aorta: The aortic root is normal in size and structure. Venous: The  inferior vena cava is normal in size with greater than 50% respiratory variability, suggesting right atrial pressure of 3 mmHg. IAS/Shunts: No atrial level shunt detected by color flow Doppler.  LEFT VENTRICLE PLAX 2D LVIDd:         3.70 cm  Diastology LVIDs:         2.70 cm  LV e' medial:    3.92 cm/s LV PW:         1.30 cm  LV E/e' medial:  14.2 LV IVS:        1.30 cm  LV e' lateral:   6.09 cm/s LVOT diam:     2.00 cm  LV E/e' lateral: 9.1 LV SV:         57 LV SV Index:   32 LVOT Area:     3.14 cm  RIGHT VENTRICLE RV S prime:     8.16 cm/s TAPSE (M-mode): 1.5 cm LEFT ATRIUM             Index       RIGHT ATRIUM           Index LA diam:        3.30 cm 1.85 cm/m  RA  Area:     10.90 cm LA Vol (A2C):   44.5 ml 25.00 ml/m RA Volume:   23.80 ml  13.37 ml/m LA Vol (A4C):   39.2 ml 22.02 ml/m LA Biplane Vol: 43.8 ml 24.60 ml/m  AORTIC VALVE LVOT Vmax:   93.50 cm/s LVOT Vmean:  59.500 cm/s LVOT VTI:    0.182 m  AORTA Ao Root diam: 3.10 cm MITRAL VALVE MV Area (PHT): 2.39 cm    SHUNTS MV Decel Time: 317 msec    Systemic VTI:  0.18 m MV E velocity: 55.60 cm/s  Systemic Diam: 2.00 cm MV A velocity: 89.80 cm/s MV E/A ratio:  0.62 Mihai Croitoru MD Electronically signed by Sanda Klein MD Signature Date/Time: 11/07/2020/3:15:30 PM    Final    CT HEAD CODE STROKE WO CONTRAST`  Result Date: 11/07/2020 CLINICAL DATA:  Code stroke.  Stroke suspected. EXAM: CT HEAD WITHOUT CONTRAST TECHNIQUE: Contiguous axial images were obtained from the base of the skull through the vertex without intravenous contrast. COMPARISON:  CT head 11/06/2020. FINDINGS: Brain: No evidence of acute large vascular territory infarction, acute hemorrhage, hydrocephalus, extra-axial collection or mass lesion/mass effect. 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 Vascular: The vasculature is mildly opacified, likely residual contrast from same day CTA chest. This limits evaluation for hyperdense vessel. Calcific  atherosclerosis. Skull: No acute fracture. Sinuses/Orbits: Sinuses are largely clear. Other: No mastoid effusions. ASPECTS [facility] Stroke Program Early CT Score) Total score (0-10 with 10 being normal): 10. IMPRESSION: 1. No evidence of acute intracranial abnormality. ASPECTS is 10. 2. 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 Code stroke imaging results were communicated on 11/07/2020 at 4:23 pm to provider Dr. Donnita Falls Via telephone, who verbally acknowledged these results. Electronically Signed   By: Margaretha Sheffield MD   On: 11/07/2020 16:26    Cardiac Studies   Echo: 11/07/20: EF 50 to 55%.  Low normal function.  GR 1 DD.  Normal RV size and function.  Unable assess pressures.  Mild aortic valve sclerosis, no stenosis.  Normal CVP.  Patient Profile     70 y.o. female with PMH of 2v CABG, paroxysmal afib, HTN, HLD, hypothyroidism, DM who presented with syncope/afib and found to have PE.   Assessment & Plan    Syncope: Initially planned for outpatient loop recorder, but subsequently found to have PE along with afib/flutter  No recurrent episodes during admission. ->  We will defer Lipitor placement to outpatient EP evaluation.   Paroxysmal atrial fibrillation/flutter: Initially converted to sinus rhythm and then went back into atrial fibrillation.  She was started on IV amiodarone and has converted to sinus rhythm with rates in the 60s yesterday morning. Maintaining SR.  -- she is bradycardiac, therefore will transition her to amiodarone 200mg  BID -- CHA2DS2-VASc Score of at least 4, will transition to Eliquis but PE dosing for initial start.   PE: CT angio with positive finding of pulmonary embolus with segmental to subsegmental embolus present in the right lower lobe. --Currently on IV heparin but will transition to Eliquis PE dosing today.   Hypertension: Developed significantly elevated blood pressures 7/10 requiring the use of IV nitro along with oral  antihypertensives.  Subsequently developed some hypotensive readings this morning and IV nitro was DC'd. --Scaled back her antihypertensive medication regimen yesterday by DC amlodipine, hold spiro and lisinopril for now. -- reduced hydralazine to 25mg  TID, this morning blood pressures are mildly elevated. Will increase hydralazine back  to home dose of 50mg  TID.   Headache/left-sided numbness: Code stroke called yesterday afternoon.  Neurology evaluated.  MRI was negative. -- Cleared to continue anticoagulation --As above we will adjust BP medication regimen and resume as tolerated   Hyperlipidemia: PTA meds include atorvastatin 80 mg daily and Zetia 10 mg daily --LDL 104   Diabetes: Hemoglobin A1c 8.1 -- SSI --PTA meds metformin 500 mg daily --Suspect would be a good candidate for SGLT2 prior to discharge   Hypothyroidism: TSH 1.7 --Continue Synthroid   Leukocytosis: WBC 11.6>>17.5>>16.4. Suspect reactive. Afebrile. CXR negative on admission  AKI: Cr 1.19>>1.58>>1.47 in the setting of n/v suspect dehydration with poor PO intake as well.  -- will give IVFs 75/hr x6 hrs -- BMET in am  For questions or updates, please contact [place] Please consult [email or web address] for contact info under        Signed, Reino Bellis, NP  11/09/2020, 9:11 AM    ATTENDING ATTESTATION  I have seen, examined and evaluated the patient this afternoon along with Reino Bellis, NP-C.  After reviewing all the available data and chart, we discussed the patients laboratory, study & physical findings as well as symptoms in detail. I agree with her findings, examination as well as impression recommendations as per our discussion.    Attending adjustments noted in italics.   Overall relatively stable.  No further atrial fibs/flutter.  Okay to reduce amiodarone loading dose to 1 mg twice daily.  Monitor heart rate.  Transitioning to PE treatment dose of Eliquis which will then be reduced to maintenance  dose Eliquis after initial load. No further neurologic symptoms.   Agree with outpatient SGLT2 inhibitor.  For now we are not monitoring her recovery of renal function.  Gentle IVF.  If labs are stable tomorrow and heart rate is stable, anticipate she can potentially be discharged.   Glenetta Hew, M.D., M.S. Interventional Cardiologist   Pager # [phone] Phone # [phone] [address] [place], [place] [address]

## 2020-11-09 NOTE — Plan of Care (Signed)
  Problem: Education: Goal: Knowledge of General Education information will improve Description Including pain rating scale, medication(s)/side effects and non-pharmacologic comfort measures Outcome: Progressing   

## 2020-11-10 ENCOUNTER — Other Ambulatory Visit (HOSPITAL_COMMUNITY): Payer: Self-pay

## 2020-11-10 DIAGNOSIS — I2694 Multiple subsegmental pulmonary emboli without acute cor pulmonale: Secondary | ICD-10-CM

## 2020-11-10 DIAGNOSIS — R519 Headache, unspecified: Secondary | ICD-10-CM

## 2020-11-10 DIAGNOSIS — E1169 Type 2 diabetes mellitus with other specified complication: Secondary | ICD-10-CM

## 2020-11-10 DIAGNOSIS — E785 Hyperlipidemia, unspecified: Secondary | ICD-10-CM

## 2020-11-10 DIAGNOSIS — I152 Hypertension secondary to endocrine disorders: Secondary | ICD-10-CM

## 2020-11-10 DIAGNOSIS — I2699 Other pulmonary embolism without acute cor pulmonale: Secondary | ICD-10-CM

## 2020-11-10 DIAGNOSIS — I480 Paroxysmal atrial fibrillation: Principal | ICD-10-CM

## 2020-11-10 HISTORY — DX: Headache, unspecified: R51.9

## 2020-11-10 HISTORY — DX: Other pulmonary embolism without acute cor pulmonale: I26.99

## 2020-11-10 LAB — CBC
HCT: 38.2 % (ref 36.0–46.0)
Hemoglobin: 13.2 g/dL (ref 12.0–15.0)
MCH: 32 pg (ref 26.0–34.0)
MCHC: 34.6 g/dL (ref 30.0–36.0)
MCV: 92.5 fL (ref 80.0–100.0)
Platelets: 324 10*3/uL (ref 150–400)
RBC: 4.13 MIL/uL (ref 3.87–5.11)
RDW: 13.1 % (ref 11.5–15.5)
WBC: 9.2 10*3/uL (ref 4.0–10.5)
nRBC: 0 % (ref 0.0–0.2)

## 2020-11-10 LAB — BASIC METABOLIC PANEL
Anion gap: 7 (ref 5–15)
BUN: 20 mg/dL (ref 8–23)
CO2: 24 mmol/L (ref 22–32)
Calcium: 9 mg/dL (ref 8.9–10.3)
Chloride: 108 mmol/L (ref 98–111)
Creatinine, Ser: 1.41 mg/dL — ABNORMAL HIGH (ref 0.44–1.00)
GFR, Estimated: 40 mL/min — ABNORMAL LOW (ref >60)
Glucose, Bld: 115 mg/dL — ABNORMAL HIGH (ref 70–99)
Potassium: 3.8 mmol/L (ref 3.5–5.1)
Sodium: 139 mmol/L (ref 135–145)

## 2020-11-10 LAB — GLUCOSE, CAPILLARY
Glucose-Capillary: 147 mg/dL — ABNORMAL HIGH (ref 70–99)
Glucose-Capillary: 166 mg/dL — ABNORMAL HIGH (ref 70–99)

## 2020-11-10 MED ORDER — AMLODIPINE BESYLATE 10 MG PO TABS
10.0000 mg | ORAL_TABLET | Freq: Every day | ORAL | Status: DC
  Administered 2020-11-10: 10 mg via ORAL
  Filled 2020-11-10: qty 1

## 2020-11-10 MED ORDER — APIXABAN (ELIQUIS) VTE STARTER PACK (10MG AND 5MG)
ORAL_TABLET | ORAL | 0 refills | Status: DC
  Filled 2020-11-10: qty 74, 30d supply, fill #0

## 2020-11-10 MED ORDER — AMIODARONE HCL 200 MG PO TABS
200.0000 mg | ORAL_TABLET | Freq: Two times a day (BID) | ORAL | 1 refills | Status: DC
  Filled 2020-11-10: qty 60, 30d supply, fill #0

## 2020-11-10 NOTE — Progress Notes (Signed)
Pt is alert and oriented. Discharge instructions/ AVS given to pt. 

## 2020-11-10 NOTE — Discharge Summary (Signed)
Discharge Summary    Patient ID: Courtney Gardner MRN: [id number]; DOB: 04-05-1951  Admit date: 11/06/2020 Discharge date: 11/10/2020  PCP:  Maylon Cos, NP   [facility] Providers Cardiologist:  Jenean Lindau, MD   {    Discharge Diagnoses    Principal Problem:   Syncope Active Problems:   Hypertension associated with diabetes ([place])   Diabetes mellitus due to underlying condition with unspecified complications ([place])   Hyperlipidemia associated with type 2 diabetes mellitus ([place])   Hypothyroidism   Paroxysmal atrial fibrillation ([place])   Pulmonary embolism ([place])   Headache  Diagnostic Studies/Procedures    Echo: 11/07/20  IMPRESSIONS     1. Left ventricular ejection fraction, by estimation, is 50 to 55%. The  left ventricle has low normal function. The left ventricle has no regional  wall motion abnormalities. There is mild concentric left ventricular  hypertrophy. Left ventricular  diastolic parameters are consistent with Grade I diastolic dysfunction  (impaired relaxation).   2. Right ventricular systolic function is normal. The right ventricular  size is normal. Tricuspid regurgitation signal is inadequate for assessing  PA pressure.   3. The mitral valve is normal in structure. No evidence of mitral valve  regurgitation.   4. The aortic valve is tricuspid. There is mild thickening of the aortic  valve. Aortic valve regurgitation is not visualized. Mild aortic valve  sclerosis is present, with no evidence of aortic valve stenosis.   5. The inferior vena cava is normal in size with greater than 50%  respiratory variability, suggesting right atrial pressure of 3 mmHg.   Comparison(s): No significant change from prior study. Prior images  reviewed side by side.  _____________   History of Present Illness     Courtney Gardner is a 70 y.o. female with PMH of 2v CABG, paroxysmal afib, HTN, HLD, hypothyroidism, DM who presented with syncope/afib and found to  have PE.   Hospital Course     Consultants: Neurology  Syncope: Initially planned for outpatient loop recorder, but subsequently found to have PE along with afib/flutter  No recurrent episodes during admission. We will defer loop placement to outpatient EP evaluation.   Paroxysmal atrial fibrillation/flutter: Initially converted to sinus rhythm and then went back into atrial fibrillation.  She was started on IV amiodarone and had converted to sinus rhythm with rates in the 60s. Maintained SR and converted to amiodarone 200mg  BID. -- CHA2DS2-VASc Score of at least 4, and was transitioned to Eliquis but PE dosing for initial start for 10mg  BID x7 days then 5mg  BID.    PE: CT angio with positive finding of pulmonary embolus with segmental to subsegmental embolus present in the right lower lobe. --as above was transitioned from IV heparin to Eliquis 10mg  BID x7, then 5mg  BID   Hypertension: Developed significantly elevated blood pressures 7/10 requiring the use of IV nitro along with oral antihypertensives.  Subsequently developed some hypotensive readings and IV nitro was DC'd. --Scaled back her antihypertensive medication initially but added back hydralazine to 50mg  TID along with amlodipine 10mg  daily -- held lisinopril on discharge as Cr remained elevated.    Headache/left-sided numbness: Code stroke called. Neurology evaluated.  MRI was negative. Felt symptoms were 2/2 hypertensive emergency. -- Cleared to continue anticoagulation   Hyperlipidemia: PTA meds include atorvastatin 80 mg daily and Zetia 10 mg daily. Continue on the same. --LDL 104   Diabetes: Hemoglobin A1c 8.1 -- SSI --PTA meds metformin 500 mg daily --Suspect would be  a good candidate for SGLT2, will defer this addition to PCP   Hypothyroidism: TSH 1.7 --Continue Synthroid   Leukocytosis: WBC 11.6>>17.5>>16.4>>9. Suspect reactive. Afebrile. CXR negative on admission. Resolved at discharge.    AKI: Cr  1.19>>1.58>>1.47>>1.4 in the setting of n/v suspect dehydration with poor PO intake as well. -- lisinopril held at discharge -- BMET at follow up appt  General: Well developed, well nourished, female appearing in no acute distress. Head: Normocephalic, atraumatic.  Neck: Supple without bruits, JVD. Lungs:  Resp regular and unlabored, CTA. Heart: RRR, S1, S2, no S3, S4, or murmur; no rub. Abdomen: Soft, non-tender, non-distended with normoactive bowel sounds. No hepatomegaly. No rebound/guarding. No obvious abdominal masses. Extremities: No clubbing, cyanosis, edema. Distal pedal pulses are 2+ bilaterally. Neuro: Alert and oriented X 3. Moves all extremities spontaneously. Psych: Normal affect.  Patient see by Dr. Ellyn Hack and deemed stable for discharge home. Follow up in the office has been arranged. Medications sent to [facility]. Educated by PharmD prior to discharge.   Did the patient have an acute coronary syndrome (MI, NSTEMI, STEMI, etc) this admission?:  No                               Did the patient have a percutaneous coronary intervention (stent / angioplasty)?:  No.       _____________  Discharge Vitals Blood pressure (!) 161/71, pulse (!) 56, temperature 98.3 F (36.8 C), temperature source Oral, resp. rate 16, height 5\' 6"  (1.676 m), weight 67.6 kg, SpO2 99 %.  Filed Weights   11/07/20 0500 11/08/20 0457 11/10/20 0535  Weight: 69 kg 70.5 kg 67.6 kg    Labs & Radiologic Studies    CBC Recent Labs    11/09/20 0027 11/10/20 0318  WBC 16.4* 9.2  HGB 13.2 13.2  HCT 38.2 38.2  MCV 91.0 92.5  PLT 346 976   Basic Metabolic Panel Recent Labs    11/08/20 1210 11/09/20 0027 11/10/20 0318  NA 138 136 139  K 3.1* 4.0 3.8  CL 104 107 108  CO2 22 22 24   GLUCOSE 147* 169* 115*  BUN 24* 26* 20  CREATININE 1.58* 1.47* 1.41*  CALCIUM 9.3 9.3 9.0  MG 1.8  --   --    Liver Function Tests No results for input(s): AST, ALT, ALKPHOS, BILITOT, PROT, ALBUMIN in the  last 72 hours. No results for input(s): LIPASE, AMYLASE in the last 72 hours. High Sensitivity Troponin:   Recent Labs  Lab 11/06/20 0947 11/06/20 1201  TROPONINIHS 12 13    BNP Invalid input(s): POCBNP D-Dimer No results for input(s): DDIMER in the last 72 hours. Hemoglobin A1C No results for input(s): HGBA1C in the last 72 hours. Fasting Lipid Panel Recent Labs    11/09/20 0027  CHOL 186  HDL 62  LDLCALC 104*  TRIG 100  CHOLHDL 3.0   Thyroid Function Tests No results for input(s): TSH, T4TOTAL, T3FREE, THYROIDAB in the last 72 hours.  Invalid input(s): FREET3 _____________  CT Head Wo Contrast  Result Date: 11/06/2020 CLINICAL DATA:  Minor head trauma.  Syncope this morning EXAM: CT HEAD WITHOUT CONTRAST TECHNIQUE: Contiguous axial images were obtained from the base of the skull through the vertex without intravenous contrast. COMPARISON:  02/19/2020 FINDINGS: Brain: No evidence of acute infarction, hemorrhage, hydrocephalus, extra-axial collection or mass lesion/mass effect. Small remote left occipital cortex infarct, see coronal reformats. Remote perforator infarct at the left  basal ganglia. Age congruent brain volume. Vascular: No hyperdense vessel or unexpected calcification. Skull: Normal. Negative for fracture or focal lesion. Sinuses/Orbits: No acute finding. IMPRESSION: 1. No acute finding or change from 2021. 2. 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 Electronically Signed   By: Monte Fantasia M.D.   On: 11/06/2020 11:10   CT Angio Chest Pulmonary Embolism (PE) W or WO Contrast  Result Date: 11/07/2020 CLINICAL DATA:  PE suspected, positive D-dimer EXAM: CT ANGIOGRAPHY CHEST WITH CONTRAST TECHNIQUE: Multidetector CT imaging of the chest was performed using the standard protocol during bolus administration of intravenous contrast. Multiplanar CT image reconstructions and MIPs were obtained to evaluate the vascular anatomy. CONTRAST:  161mL OMNIPAQUE  IOHEXOL 350 MG/ML SOLN COMPARISON:  10/17/2019 FINDINGS: Cardiovascular: Satisfactory opacification of the pulmonary arteries to the segmental level. Positive examination for pulmonary embolism with segmental to subsegmental embolus present in the right lower lobe (series 7, image 190). Normal heart size. Three-vessel coronary artery calcifications. No pericardial effusion. Scattered aortic atherosclerosis. Mediastinum/Nodes: No enlarged mediastinal, hilar, or axillary lymph nodes. Thyroid gland, trachea, and esophagus demonstrate no significant findings. Lungs/Pleura: Lungs are clear. Small left pleural effusion and associated atelectasis or consolidation. Upper Abdomen: No acute abnormality. Musculoskeletal: No chest wall abnormality. No acute or significant osseous findings. Review of the MIP images confirms the above findings. IMPRESSION: 1. Positive examination for pulmonary embolism with segmental to subsegmental embolus present in the right lower lobe. 2. Small left pleural effusion and associated atelectasis or consolidation. 3. Coronary artery disease. Call report request was placed at the time of dictation; final communication will be documented. Aortic Atherosclerosis (ICD10-I70.0). Electronically Signed   By: Eddie Candle M.D.   On: 11/07/2020 11:54   MR ANGIO HEAD WO CONTRAST  Addendum Date: 11/07/2020   ADDENDUM REPORT: 11/07/2020 22:38 ADDENDUM: On further review, there is multifocal moderate stenosis of the right P2 PCA. This addendum was discussed with Dr. Lorrin Goodell via telephone at 10:37 p.m. Electronically Signed   By: Margaretha Sheffield MD   On: 11/07/2020 22:38   Result Date: 11/07/2020 CLINICAL DATA:  Neuro deficit, acute stroke suspected EXAM: MRI HEAD WITHOUT CONTRAST MRA HEAD WITHOUT CONTRAST MRA NECK WITHOUT AND WITH CONTRAST TECHNIQUE: Multiplanar, multi-echo pulse sequences of the brain and surrounding structures were acquired without intravenous contrast. Angiographic images of the  Circle of Willis were acquired using MRA technique without intravenous contrast. Angiographic images of the neck were acquired using MRA technique without and with intravenous contrast. Carotid stenosis measurements (when applicable) are obtained utilizing NASCET criteria, using the distal internal carotid diameter as the denominator. CONTRAST:  6.45mL GADAVIST GADOBUTROL 1 MMOL/ML IV SOLN COMPARISON:  Same day CT head. FINDINGS: MRI HEAD FINDINGS Brain: Small left posterior parahippocampal focus of DWI hyperintensity (series 3, image 25) without clear ADC correlate, potentially a small acute/subacute infarct versus artifact. Remote infarct in the left basal ganglia. Moderate scattered T2/FLAIR hyperintensities within the white matter, likely related to chronic microvascular ischemic disease. No hydrocephalus, acute hemorrhage, extra-axial fluid collection, mass lesion, or midline shift. Vascular: See below. Skull and upper cervical spine: Normal marrow signal. Sinuses/Orbits: No acute findings. Other: No mastoid effusions. MRA HEAD FINDINGS Anterior circulation: Bilateral ICAs, MCAs, and ACAs are patent without proximal hemodynamically significant stenosis. No aneurysm identified. Posterior circulation: Bilateral intradural vertebral arteries are patent. The basilar artery and bilateral posterior cerebral arteries are patent without proximal hemodynamically significant stenosis. MRA NECK FINDINGS Aortic arch: Great vessel origins are patent. No significant (greater than  50%) stenosis. Right carotid system: No significant (greater than 50%) stenosis. Left carotid system: There is atherosclerotic narrowing at the carotid bifurcation without stenosis, although the degree of stenosis does not appear to exceed 50% in comparison to the distal vessel. Vertebral arteries: Bilateral vertebral arteries are patent without evidence of greater than 50% stenosis. IMPRESSION: MRI: 1. Small left posterior parahippocampal focus of  DWI hyperintensity (series 3, image 25) without clear ADC correlate, potentially a artifactual or a small acute/subacute infarct. No significant edema or mass effect. 2. 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 3. Moderate chronic microvascular ischemic disease MRA: 1. No large vessel occlusion or proximal hemodynamically significant stenosis intracranially. 2. In the neck, there is atherosclerosis at the left carotid bifurcation; however, the degree of stenosis does not appear to exceed 50%. Electronically Signed: By: Margaretha Sheffield MD On: 11/07/2020 18:39   MR ANGIO NECK W WO CONTRAST  Addendum Date: 11/07/2020   ADDENDUM REPORT: 11/07/2020 22:38 ADDENDUM: On further review, there is multifocal moderate stenosis of the right P2 PCA. This addendum was discussed with Dr. Lorrin Goodell via telephone at 10:37 p.m. Electronically Signed   By: Margaretha Sheffield MD   On: 11/07/2020 22:38   Result Date: 11/07/2020 CLINICAL DATA:  Neuro deficit, acute stroke suspected EXAM: MRI HEAD WITHOUT CONTRAST MRA HEAD WITHOUT CONTRAST MRA NECK WITHOUT AND WITH CONTRAST TECHNIQUE: Multiplanar, multi-echo pulse sequences of the brain and surrounding structures were acquired without intravenous contrast. Angiographic images of the Circle of Willis were acquired using MRA technique without intravenous contrast. Angiographic images of the neck were acquired using MRA technique without and with intravenous contrast. Carotid stenosis measurements (when applicable) are obtained utilizing NASCET criteria, using the distal internal carotid diameter as the denominator. CONTRAST:  6.28mL GADAVIST GADOBUTROL 1 MMOL/ML IV SOLN COMPARISON:  Same day CT head. FINDINGS: MRI HEAD FINDINGS Brain: Small left posterior parahippocampal focus of DWI hyperintensity (series 3, image 25) without clear ADC correlate, potentially a small acute/subacute infarct versus artifact. Remote infarct in the left basal ganglia. Moderate scattered  T2/FLAIR hyperintensities within the white matter, likely related to chronic microvascular ischemic disease. No hydrocephalus, acute hemorrhage, extra-axial fluid collection, mass lesion, or midline shift. Vascular: See below. Skull and upper cervical spine: Normal marrow signal. Sinuses/Orbits: No acute findings. Other: No mastoid effusions. MRA HEAD FINDINGS Anterior circulation: Bilateral ICAs, MCAs, and ACAs are patent without proximal hemodynamically significant stenosis. No aneurysm identified. Posterior circulation: Bilateral intradural vertebral arteries are patent. The basilar artery and bilateral posterior cerebral arteries are patent without proximal hemodynamically significant stenosis. MRA NECK FINDINGS Aortic arch: Great vessel origins are patent. No significant (greater than 50%) stenosis. Right carotid system: No significant (greater than 50%) stenosis. Left carotid system: There is atherosclerotic narrowing at the carotid bifurcation without stenosis, although the degree of stenosis does not appear to exceed 50% in comparison to the distal vessel. Vertebral arteries: Bilateral vertebral arteries are patent without evidence of greater than 50% stenosis. IMPRESSION: MRI: 1. Small left posterior parahippocampal focus of DWI hyperintensity (series 3, image 25) without clear ADC correlate, potentially a artifactual or a small acute/subacute infarct. No significant edema or mass effect. 2. 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 3. Moderate chronic microvascular ischemic disease MRA: 1. No large vessel occlusion or proximal hemodynamically significant stenosis intracranially. 2. In the neck, there is atherosclerosis at the left carotid bifurcation; however, the degree of stenosis does not appear to exceed 50%. Electronically Signed: By: Margaretha Sheffield MD On:  11/07/2020 18:39   MR BRAIN WO CONTRAST  Addendum Date: 11/07/2020   ADDENDUM REPORT: 11/07/2020 22:38 ADDENDUM: On further  review, there is multifocal moderate stenosis of the right P2 PCA. This addendum was discussed with Dr. Lorrin Goodell via telephone at 10:37 p.m. Electronically Signed   By: Margaretha Sheffield MD   On: 11/07/2020 22:38   Result Date: 11/07/2020 CLINICAL DATA:  Neuro deficit, acute stroke suspected EXAM: MRI HEAD WITHOUT CONTRAST MRA HEAD WITHOUT CONTRAST MRA NECK WITHOUT AND WITH CONTRAST TECHNIQUE: Multiplanar, multi-echo pulse sequences of the brain and surrounding structures were acquired without intravenous contrast. Angiographic images of the Circle of Willis were acquired using MRA technique without intravenous contrast. Angiographic images of the neck were acquired using MRA technique without and with intravenous contrast. Carotid stenosis measurements (when applicable) are obtained utilizing NASCET criteria, using the distal internal carotid diameter as the denominator. CONTRAST:  6.32mL GADAVIST GADOBUTROL 1 MMOL/ML IV SOLN COMPARISON:  Same day CT head. FINDINGS: MRI HEAD FINDINGS Brain: Small left posterior parahippocampal focus of DWI hyperintensity (series 3, image 25) without clear ADC correlate, potentially a small acute/subacute infarct versus artifact. Remote infarct in the left basal ganglia. Moderate scattered T2/FLAIR hyperintensities within the white matter, likely related to chronic microvascular ischemic disease. No hydrocephalus, acute hemorrhage, extra-axial fluid collection, mass lesion, or midline shift. Vascular: See below. Skull and upper cervical spine: Normal marrow signal. Sinuses/Orbits: No acute findings. Other: No mastoid effusions. MRA HEAD FINDINGS Anterior circulation: Bilateral ICAs, MCAs, and ACAs are patent without proximal hemodynamically significant stenosis. No aneurysm identified. Posterior circulation: Bilateral intradural vertebral arteries are patent. The basilar artery and bilateral posterior cerebral arteries are patent without proximal hemodynamically significant  stenosis. MRA NECK FINDINGS Aortic arch: Great vessel origins are patent. No significant (greater than 50%) stenosis. Right carotid system: No significant (greater than 50%) stenosis. Left carotid system: There is atherosclerotic narrowing at the carotid bifurcation without stenosis, although the degree of stenosis does not appear to exceed 50% in comparison to the distal vessel. Vertebral arteries: Bilateral vertebral arteries are patent without evidence of greater than 50% stenosis. IMPRESSION: MRI: 1. Small left posterior parahippocampal focus of DWI hyperintensity (series 3, image 25) without clear ADC correlate, potentially a artifactual or a small acute/subacute infarct. No significant edema or mass effect. 2. 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 3. Moderate chronic microvascular ischemic disease MRA: 1. No large vessel occlusion or proximal hemodynamically significant stenosis intracranially. 2. In the neck, there is atherosclerosis at the left carotid bifurcation; however, the degree of stenosis does not appear to exceed 50%. Electronically Signed: By: Margaretha Sheffield MD On: 11/07/2020 18:39   DG Chest Portable 1 View  Result Date: 11/06/2020 CLINICAL DATA:  Chest pain EXAM: PORTABLE CHEST 1 VIEW COMPARISON:  02/19/2020 FINDINGS: Normal heart size and mediastinal contours. CABG. No acute infiltrate or edema. No effusion or pneumothorax. No acute osseous findings. IMPRESSION: No evidence of active disease. Electronically Signed   By: Monte Fantasia M.D.   On: 11/06/2020 10:33   ECHOCARDIOGRAM COMPLETE  Result Date: 11/07/2020    ECHOCARDIOGRAM REPORT   Patient Name:   Courtney Gardner Carpino Date of Exam: 11/07/2020 Medical Rec #:  [id number]      Height:       66.0 in Accession #:    [id number]     Weight:       152.1 lb Date of Birth:  Nov 01, 1950      BSA:  1.780 m Patient Age:    70 years       BP:           125/64 mmHg Patient Gender: F              HR:           79 bpm. Exam  Location:  Inpatient Procedure: 2D Echo, Cardiac Doppler and Color Doppler Indications:    Syncope R55  History:        Patient has prior history of Echocardiogram examinations, most                 recent 09/11/2019. CAD, Arrythmias:Atrial Fibrillation; Risk                 Factors:Hypertension, Diabetes, Dyslipidemia and Former Smoker.  Sonographer:    Alvino Chapel RCS Referring Phys: [place]  1. Left ventricular ejection fraction, by estimation, is 50 to 55%. The left ventricle has low normal function. The left ventricle has no regional wall motion abnormalities. There is mild concentric left ventricular hypertrophy. Left ventricular diastolic parameters are consistent with Grade I diastolic dysfunction (impaired relaxation).  2. Right ventricular systolic function is normal. The right ventricular size is normal. Tricuspid regurgitation signal is inadequate for assessing PA pressure.  3. The mitral valve is normal in structure. No evidence of mitral valve regurgitation.  4. The aortic valve is tricuspid. There is mild thickening of the aortic valve. Aortic valve regurgitation is not visualized. Mild aortic valve sclerosis is present, with no evidence of aortic valve stenosis.  5. The inferior vena cava is normal in size with greater than 50% respiratory variability, suggesting right atrial pressure of 3 mmHg. Comparison(s): No significant change from prior study. Prior images reviewed side by side. FINDINGS  Left Ventricle: Left ventricular ejection fraction, by estimation, is 50 to 55%. The left ventricle has low normal function. The left ventricle has no regional wall motion abnormalities. The left ventricular internal cavity size was normal in size. There is mild concentric left ventricular hypertrophy. Abnormal (paradoxical) septal motion consistent with post-operative status. Left ventricular diastolic parameters are consistent with Grade I diastolic dysfunction (impaired relaxation).  Indeterminate filling pressures. Right Ventricle: The right ventricular size is normal. No increase in right ventricular wall thickness. Right ventricular systolic function is normal. Tricuspid regurgitation signal is inadequate for assessing PA pressure. Left Atrium: Left atrial size was normal in size. Right Atrium: Right atrial size was normal in size. Pericardium: There is no evidence of pericardial effusion. Mitral Valve: The mitral valve is normal in structure. No evidence of mitral valve regurgitation. Tricuspid Valve: The tricuspid valve is grossly normal. Tricuspid valve regurgitation is not demonstrated. Aortic Valve: The aortic valve is tricuspid. There is mild thickening of the aortic valve. Aortic valve regurgitation is not visualized. Mild aortic valve sclerosis is present, with no evidence of aortic valve stenosis. Pulmonic Valve: The pulmonic valve was grossly normal. Pulmonic valve regurgitation is not visualized. Aorta: The aortic root is normal in size and structure. Venous: The inferior vena cava is normal in size with greater than 50% respiratory variability, suggesting right atrial pressure of 3 mmHg. IAS/Shunts: No atrial level shunt detected by color flow Doppler.  LEFT VENTRICLE PLAX 2D LVIDd:         3.70 cm  Diastology LVIDs:         2.70 cm  LV e' medial:    3.92 cm/s LV PW:  1.30 cm  LV E/e' medial:  14.2 LV IVS:        1.30 cm  LV e' lateral:   6.09 cm/s LVOT diam:     2.00 cm  LV E/e' lateral: 9.1 LV SV:         57 LV SV Index:   32 LVOT Area:     3.14 cm  RIGHT VENTRICLE RV S prime:     8.16 cm/s TAPSE (M-mode): 1.5 cm LEFT ATRIUM             Index       RIGHT ATRIUM           Index LA diam:        3.30 cm 1.85 cm/m  RA Area:     10.90 cm LA Vol (A2C):   44.5 ml 25.00 ml/m RA Volume:   23.80 ml  13.37 ml/m LA Vol (A4C):   39.2 ml 22.02 ml/m LA Biplane Vol: 43.8 ml 24.60 ml/m  AORTIC VALVE LVOT Vmax:   93.50 cm/s LVOT Vmean:  59.500 cm/s LVOT VTI:    0.182 m  AORTA Ao  Root diam: 3.10 cm MITRAL VALVE MV Area (PHT): 2.39 cm    SHUNTS MV Decel Time: 317 msec    Systemic VTI:  0.18 m MV E velocity: 55.60 cm/s  Systemic Diam: 2.00 cm MV A velocity: 89.80 cm/s MV E/A ratio:  0.62 Mihai Croitoru MD Electronically signed by Sanda Klein MD Signature Date/Time: 11/07/2020/3:15:30 PM    Final    CT HEAD CODE STROKE WO CONTRAST`  Result Date: 11/07/2020 CLINICAL DATA:  Code stroke.  Stroke suspected. EXAM: CT HEAD WITHOUT CONTRAST TECHNIQUE: Contiguous axial images were obtained from the base of the skull through the vertex without intravenous contrast. COMPARISON:  CT head 11/06/2020. FINDINGS: Brain: No evidence of acute large vascular territory infarction, acute hemorrhage, hydrocephalus, extra-axial collection or mass lesion/mass effect. 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 Vascular: The vasculature is mildly opacified, likely residual contrast from same day CTA chest. This limits evaluation for hyperdense vessel. Calcific atherosclerosis. Skull: No acute fracture. Sinuses/Orbits: Sinuses are largely clear. Other: No mastoid effusions. ASPECTS [facility] Stroke Program Early CT Score) Total score (0-10 with 10 being normal): 10. IMPRESSION: 1. No evidence of acute intracranial abnormality. ASPECTS is 10. 2. 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 Code stroke imaging results were communicated on 11/07/2020 at 4:23 pm to provider Dr. Donnita Falls Via telephone, who verbally acknowledged these results. Electronically Signed   By: Margaretha Sheffield MD   On: 11/07/2020 16:26   Disposition   Pt is being discharged home today in good condition.  Follow-up Plans & Appointments     Follow-up Information     Revankar, Reita Cliche, MD Follow up on 12/01/2020.   Specialty: Cardiology Why: at 1:20pm for your follow up appt Contact information: [place] [address] [phone]                Discharge  Instructions     Call MD for:  difficulty breathing, headache or visual disturbances   Complete by: As directed    Call MD for:  redness, tenderness, or signs of infection (pain, swelling, redness, odor or green/yellow discharge around incision site)   Complete by: As directed    Diet - low sodium heart healthy   Complete by: As directed    Increase activity slowly   Complete by: As  directed        Discharge Medications   Allergies as of 11/10/2020   No Known Allergies      Medication List     STOP taking these medications    lisinopril 20 MG tablet Commonly known as: ZESTRIL       TAKE these medications    amiodarone 200 MG tablet Commonly known as: PACERONE Take 1 tablet (200 mg total) by mouth 2 (two) times daily.   amLODipine 10 MG tablet Commonly known as: NORVASC Take 1 tablet (10 mg total) by mouth daily.   aspirin EC 81 MG tablet Take 1 tablet (81 mg total) by mouth daily.   atorvastatin 80 MG tablet Commonly known as: LIPITOR Take 80 mg by mouth daily.   BOOST HIGH PROTEIN PO Take 1 Can by mouth in the morning and at bedtime.   Eliquis DVT/PE Starter Pack Generic drug: Apixaban Starter Pack (10mg  and 5mg ) Take as directed on package: start with two-5mg  tablets twice daily for 7 days. On day 8, switch to one-5mg  tablet twice daily.   ezetimibe 10 MG tablet Commonly known as: ZETIA Take 1 tablet (10 mg total) by mouth daily.   hydrALAZINE 50 MG tablet Commonly known as: APRESOLINE Take 1 tablet (50 mg total) by mouth every 8 (eight) hours.   levothyroxine 50 MCG tablet Commonly known as: SYNTHROID Take 50 mcg by mouth daily before breakfast.   metFORMIN 500 MG tablet Commonly known as: GLUCOPHAGE Take 500 mg by mouth daily with breakfast.   nitroGLYCERIN 0.4 MG SL tablet Commonly known as: NITROSTAT Place 1 tablet (0.4 mg total) under the tongue every 5 (five) minutes as needed for chest pain.   omeprazole 40 MG capsule Commonly known  as: PRILOSEC Take 1 capsule (40 mg total) by mouth daily.   PARoxetine 10 MG tablet Commonly known as: PAXIL Take 10 mg by mouth daily.   Tylenol 8 Hour 650 MG CR tablet Generic drug: acetaminophen Take 650 mg by mouth every 8 (eight) hours as needed for pain.          Outstanding Labs/Studies   BMET at follow up appt  Duration of Discharge Encounter   Greater than 30 minutes including physician time.  Signed, Reino Bellis, NP 11/10/2020, 10:53 AM

## 2020-11-16 ENCOUNTER — Telehealth (HOSPITAL_COMMUNITY): Payer: Self-pay

## 2020-11-16 ENCOUNTER — Other Ambulatory Visit (HOSPITAL_COMMUNITY): Payer: Self-pay

## 2020-11-16 NOTE — Telephone Encounter (Signed)
Pharmacy Transitions of Care Follow-up Telephone Call  Date of discharge: 11/10/20  Discharge Diagnosis: Angina Pectoris  How have you been since you were released from the hospital?  Patient is doing well, no questions about her medications.  Medication changes made at discharge:     START taking: amiodarone (PACERONE)  Eliquis DVT/PE Starter Pack (Apixaban Starter Pack (10mg  and 5mg ))   STOP taking: lisinopril 20 MG tablet (ZESTRIL)   Medication changes verified by the patient?  Yes    Medication Accessibility:  Home Pharmacy:  [facility]  Was the patient provided with refills on discharged medications? 1 refill on amiodarone, no maintenance refills on Eliquis   Have all prescriptions been transferred from [facility] to home pharmacy?  Yes  Is the patient able to afford medications? Patient has AARPMPD    Medication Review:  APIXABAN (ELIQUIS)  Apixaban 10 mg BID initiated on 11/10/20. Will switch to apixaban 5 mg BID after 7 days (DATE 11/17/20).  - Discussed importance of taking medication around the same time everyday  - Advised patient of medications to avoid (NSAIDs, ASA)  - Educated that Tylenol (acetaminophen) will be the preferred analgesic to prevent risk of bleeding  - Emphasized importance of monitoring for signs and symptoms of bleeding (abnormal bruising, prolonged bleeding, nose bleeds, bleeding from gums, discolored urine, black tarry stools)  - Advised patient to alert all providers of anticoagulation therapy prior to starting a new medication or having a procedure    Follow-up Appointments:  PCP Hospital f/u appt confirmed? None currently scheduled with PCP  [facility] f/u appt confirmed? Yes Scheduled to see Dr. Caryl Comes on 11/17/20 @ Cardiology.   If their condition worsens, is the pt aware to call PCP or go to the Emergency Dept.? Yes  Final Patient Assessment: Patient doing well, has follow up scheduled and knows to get refills at follow up  visit.

## 2020-11-17 ENCOUNTER — Ambulatory Visit (INDEPENDENT_AMBULATORY_CARE_PROVIDER_SITE_OTHER): Payer: Medicare Other | Admitting: Internal Medicine

## 2020-11-17 ENCOUNTER — Encounter: Payer: Self-pay | Admitting: Internal Medicine

## 2020-11-17 ENCOUNTER — Other Ambulatory Visit: Payer: Self-pay

## 2020-11-17 VITALS — BP 116/60 | HR 71 | Ht 66.0 in | Wt 151.4 lb

## 2020-11-17 DIAGNOSIS — I480 Paroxysmal atrial fibrillation: Secondary | ICD-10-CM | POA: Diagnosis not present

## 2020-11-17 DIAGNOSIS — R55 Syncope and collapse: Secondary | ICD-10-CM | POA: Diagnosis not present

## 2020-11-17 NOTE — Patient Instructions (Signed)
Medication Instructions:  Your physician has recommended you make the following change in your medication:   ** Stop your Aspirin  *If you need a refill on your cardiac medications before your next appointment, please call your pharmacy*   Lab Work: None ordered.  If you have labs (blood work) drawn today and your tests are completely normal, you will receive your results only by: [place] (if you have MyChart) OR A paper copy in the mail If you have any lab test that is abnormal or we need to change your treatment, we will call you to review the results.   Testing/Procedures: None ordered.    Follow-Up: At [facility], you and your health needs are our priority.  As part of our continuing mission to provide you with exceptional heart care, we have created designated Provider Care Teams.  These Care Teams include your primary Cardiologist (physician) and Advanced Practice Providers (APPs -  Physician Assistants and Nurse Practitioners) who all work together to provide you with the care you need, when you need it.  We recommend signing up for the patient portal called "MyChart".  Sign up information is provided on this After Visit Summary.  MyChart is used to connect with patients for Virtual Visits (Telemedicine).  Patients are able to view lab/test results, encounter notes, upcoming appointments, etc.  Non-urgent messages can be sent to your provider as well.   To learn more about what you can do with MyChart, go to [email or web address].    Your next appointment:   12/31/2020 at 1120am with Oda Kilts, PA-C   Other Instructions  Implantable Loop Recorder Placement, Care After Refer to this sheet in the next few weeks. These instructions provide you with information about caring for yourself after your procedure. Your health care provider may also give you more specific instructions. Your treatment has been planned according to current medical practices, but problems  sometimes occur. Call your health care provider if you have any problems or questions after your procedure. What can I expect after the procedure? After the procedure, it is common to have: Soreness or pain near the cut from surgery (incision). Some swelling or bruising near the incision.  Follow these instructions at home:  Medicines Take over-the-counter and prescription medicines only as told by your health care provider. If you were prescribed an antibiotic medicine, take it as told by your health care provider. Do not stop taking the antibiotic even if you start to feel better.  Bathing Do not take baths, swim, or use a hot tub until your health care provider approves.    You may shower tomorrow afternoon with the large bandage on.  You may remove the large bandage this Saturday 11/20/2020

## 2020-11-17 NOTE — Progress Notes (Signed)
Patient ID: Courtney Gardner, female   DOB: 05-26-1950, 70 y.o.   MRN: [id number]      Patient Care Team: Maylon Cos, NP as PCP - General Revankar, Reita Cliche, MD as PCP - Cardiology (Cardiology)   HPI                        Courtney Gardner is a 70 y.o. female seen following a hospitalization recently for syncope thought probably neurally mediated occurring in the context of coronary artery disease with prior bypass, normal ECG and normal LV function.   During the hospitalization, she was found by CT to have a PE and was started on Eliquis.  Also had recurrent atrial arrhythmias, A. fib/flutter and was started on amiodarone.    She has a history of tachypalpitations and some bradycardia she is here today for loop recorder    Today, the patient denies chest pain, shortness of breath, nocturnal dyspnea, orthopnea or peripheral edema.  There have been no palpitations, lightheadedness or syncope.Complains of brief palpitations   Been doing better since hospital stary    Date   Cr              K            Hgb TSH LFTs  10/21 1.32 4.1              12.0    7/22  1.41 3.8              13.2 1.71    DATE TEST EF%   07/19 Echo  60-65 %   7/19 LHC  55-65 %   4/21 MyoView 44%   5/21 Echo 50-55%   6/21 LHC 60-65%   6/12 Echo 60-65%   7/22 Echo 44-81%    Thromboembolic risk factors ( age -36, HTN-1, DM-1, Vasc disease -1, Gender-1) for a CHADSVASc Score of >=5   Past Medical History:  Diagnosis Date   Abdominal bruit 12/12/2019   AKI (acute kidney injury) ([place]) 10/17/2019   Angina pectoris ([place]) 11/20/2017   Barrett's esophagus 09/30/2019   Cancer ([place])    Coronary artery disease involving native coronary artery of native heart 12/26/2017   Diabetes mellitus due to underlying condition with unspecified complications ([place]) 85/63/1497   Type 2, on metformin   Dysrhythmia    Essential hypertension 11/20/2017   Ex-smoker 11/20/2017   Glaucoma 09/30/2019   Hyperlipidemia  11/20/2017   Hyperlipidemia associated with type 2 diabetes mellitus ([place]) 10/2017   Hypertension associated with diabetes ([place]) 11/20/2017   Hypothyroidism 10/17/2019   Iliotibial band syndrome of left side 09/09/2015   Lacunar infarction ([place]) 02/19/2020   Palpitations 08/12/2019   Paroxysmal atrial fibrillation ([place]) 10/17/2019   Pleural effusion on left 10/17/2019   Renal calculi 09/30/2019   Skin cancer of arm, left    Syncope 02/19/2020    Past Surgical History:  Procedure Laterality Date   CHOLECYSTECTOMY     CORONARY ARTERY BYPASS GRAFT N/A 10/02/2019   Procedure: CORONARY ARTERY BYPASS GRAFTING (CABG) TIMES TWO, ON PUMP, USING LEFT INTERNAL MAMMARY ARTERY AND RIGHT GREATER SAPHENOUS VEIN HARVESTED ENDOSCOPICALLY;  Surgeon: [name], MD;  Location: [facility];  Service: Open Heart Surgery;  Laterality: N/A;  LIMA to LAD, SVG to OM 1   INTRAVASCULAR PRESSURE WIRE/FFR STUDY N/A 11/23/2017   Procedure: INTRAVASCULAR PRESSURE WIRE/FFR STUDY;  Surgeon: Jettie Booze, MD;  Location: [facility];  Service: Cardiovascular;  Laterality: N/A;   IR THORACENTESIS ASP PLEURAL SPACE W/IMG GUIDE  12/04/2019   LEFT HEART CATH AND CORONARY ANGIOGRAPHY N/A 11/23/2017   Procedure: LEFT HEART CATH AND CORONARY ANGIOGRAPHY;  Surgeon: Jettie Booze, MD;  Location: [facility];  Service: Cardiovascular;  Laterality: N/A;   LEFT HEART CATH AND CORONARY ANGIOGRAPHY N/A 09/30/2019   Procedure: LEFT HEART CATH AND CORONARY ANGIOGRAPHY;  Surgeon: Troy Sine, MD;  Location: [facility];  Service: Cardiovascular;  Laterality: N/A;   MEDIASTINAL EXPLORATION N/A 10/03/2019   Procedure: MEDIASTINAL WASHOUT;  Surgeon: [name], MD;  Location: [facility];  Service: Thoracic;  Laterality: N/A;   TEE WITHOUT CARDIOVERSION N/A 10/02/2019   Procedure: TRANSESOPHAGEAL ECHOCARDIOGRAM (TEE);  Surgeon: [name], MD;  Location: [facility];  Service: Open Heart Surgery;   Laterality: N/A;   TUBAL LIGATION      Current Meds  Medication Sig   acetaminophen (TYLENOL) 650 MG CR tablet Take 650 mg by mouth every 8 (eight) hours as needed for pain.   amiodarone (PACERONE) 200 MG tablet Take 1 tablet (200 mg total) by mouth 2 (two) times daily.   amLODipine (NORVASC) 10 MG tablet Take 1 tablet (10 mg total) by mouth daily.   APIXABAN (ELIQUIS) VTE STARTER PACK (10MG  AND 5MG ) Take as directed on package: start with two-5mg  tablets twice daily for 7 days. On day 8, switch to one-5mg  tablet twice daily.   aspirin EC 81 MG tablet Take 1 tablet (81 mg total) by mouth daily.   atorvastatin (LIPITOR) 80 MG tablet Take 80 mg by mouth daily.   hydrALAZINE (APRESOLINE) 50 MG tablet Take 1 tablet (50 mg total) by mouth every 8 (eight) hours.   levothyroxine (SYNTHROID, LEVOTHROID) 50 MCG tablet Take 50 mcg by mouth daily before breakfast.    metFORMIN (GLUCOPHAGE) 500 MG tablet Take 500 mg by mouth daily with breakfast.    Nutritional Supplements (BOOST HIGH PROTEIN PO) Take 1 Can by mouth in the morning and at bedtime.   omeprazole (PRILOSEC) 40 MG capsule Take 1 capsule (40 mg total) by mouth daily.   PARoxetine (PAXIL) 10 MG tablet Take 10 mg by mouth daily.    No Known Allergies  Review of Systems negative except from HPI and PMH  Physical Exam: BP 116/60   Pulse 71   Ht 5\' 6"  (1.676 m)   Wt 151 lb 6.4 oz (68.7 kg)   SpO2 98%   BMI 24.44 kg/m  Well developed and nourished in no acute distress HENT normal Neck supple with JVP-  flat  Lungs Clear Regular rate and rhythm, no murmurs or gallops Abd-soft with active BS No Clubbing cyanosis No edema Skin-warm and dry A & Oriented  Grossly normal sensory and motor function  ECG sinus @ 71 14/10/44   Assessment and  Plan: Atrial fibrillation/flutter  Syncope  High risk medication surveillance  Coronary artery disease with prior bypass  The patient has atrial fibrillation and flutter seen in the  hospital.  Now on amiodarone and Eliquis.  Tolerating the amiodarone.  We will anticipate repeat surveillance laboratories in 4 weeks.  For recurrent syncope, implantable loop recorder   Stop ASA     Pre op Dx recurrent syncope Post op Dx Same  Procedure  Loop Recorder implantation  After routine prep and drape of the left parasternal area, a small incision was created. A Medtronic LINQ Reveal Loop Recorder  Serial Number  [id number] was inserted.  SteriStrip dressing was  applied.  The patient tolerated the procedure without apparent complication.  EBL < 10cc        Current medicines are reviewed at length with the patient today .  The patient does not have concerns regarding medicines.   I,Stephanie Williams,acting as a Education administrator for Virl Axe, MD.,have documented all relevant documentation on the behalf of Virl Axe, MD,as directed by  Virl Axe, MD while in the presence of Virl Axe, MD.  I, Virl Axe, MD, have reviewed all documentation for this visit. The documentation on 11/17/20 for the exam, diagnosis, procedures, and orders are all accurate and complete.

## 2020-11-30 DIAGNOSIS — I499 Cardiac arrhythmia, unspecified: Secondary | ICD-10-CM | POA: Insufficient documentation

## 2020-11-30 DIAGNOSIS — C44609 Unspecified malignant neoplasm of skin of left upper limb, including shoulder: Secondary | ICD-10-CM | POA: Insufficient documentation

## 2020-11-30 DIAGNOSIS — C801 Malignant (primary) neoplasm, unspecified: Secondary | ICD-10-CM | POA: Insufficient documentation

## 2020-12-01 ENCOUNTER — Encounter: Payer: Self-pay | Admitting: Cardiology

## 2020-12-01 ENCOUNTER — Ambulatory Visit (INDEPENDENT_AMBULATORY_CARE_PROVIDER_SITE_OTHER): Payer: Medicare Other | Admitting: Cardiology

## 2020-12-01 ENCOUNTER — Other Ambulatory Visit: Payer: Self-pay

## 2020-12-01 VITALS — BP 140/62 | HR 66 | Ht 66.0 in | Wt 155.6 lb

## 2020-12-01 DIAGNOSIS — E782 Mixed hyperlipidemia: Secondary | ICD-10-CM | POA: Diagnosis not present

## 2020-12-01 DIAGNOSIS — I10 Essential (primary) hypertension: Secondary | ICD-10-CM

## 2020-12-01 DIAGNOSIS — I480 Paroxysmal atrial fibrillation: Secondary | ICD-10-CM

## 2020-12-01 DIAGNOSIS — E088 Diabetes mellitus due to underlying condition with unspecified complications: Secondary | ICD-10-CM

## 2020-12-01 DIAGNOSIS — I2510 Atherosclerotic heart disease of native coronary artery without angina pectoris: Secondary | ICD-10-CM

## 2020-12-01 NOTE — Patient Instructions (Signed)

## 2020-12-01 NOTE — Progress Notes (Signed)
Cardiology Office Note:    Date:  12/01/2020   ID:  Courtney Gardner, DOB December 27, 1950, MRN [id number]  PCP:  Maylon Cos, NP  Cardiologist:  Jenean Lindau, MD   Referring MD: Maylon Cos, NP    ASSESSMENT:    1. Coronary artery disease involving native coronary artery of native heart without angina pectoris   2. Essential hypertension   3. Mixed hyperlipidemia   4. Paroxysmal atrial fibrillation (HCC)   5. Diabetes mellitus due to underlying condition with unspecified complications ([place])    PLAN:    In order of problems listed above:  Coronary artery disease: I discussed my findings with the patient at length and secondary prevention stressed with the patient.  Importance of compliance with diet medication stressed and she vocalized understanding.  Patient was advised to walk at least half an hour a day 5 days a week and she promises to do so. History of pulmonary embolism: Recent.  I reviewed all her records extensively and discussed findings with her.  Benefits and potential risk of anticoagulation explained and she understands.  This issue will be handled by her primary care. Essential hypertension: Blood pressure stable and diet was emphasized.   Mixed dyslipidemia: She is compliant with medical care.  She follows instructions meticulously.  She had multiple questions which were answered to her satisfaction. Patient will be seen in follow-up appointment in 6 months or earlier if the patient has any concerns    Medication Adjustments/Labs and Tests Ordered: Current medicines are reviewed at length with the patient today.  Concerns regarding medicines are outlined above.  Orders Placed This Encounter  Procedures   EKG 12-Lead   No orders of the defined types were placed in this encounter.    No chief complaint on file.    History of Present Illness:    Courtney Gardner is a 70 y.o. female.  Patient has past medical history of coronary artery disease post  coronary artery bypass surgery, essential hypertension dyslipidemia.  She mentions to me that she went to the hospital with a near syncopal episode and was found to have pulmonary embolism and was placed on anticoagulation.  Subsequently she is doing fine.  No chest pain orthopnea or PND.  At the time of my evaluation, the patient is alert awake oriented and in no distress.  Past Medical History:  Diagnosis Date   Abdominal bruit 12/12/2019   AKI (acute kidney injury) ([place]) 10/17/2019   Angina pectoris ([place]) 11/20/2017   Barrett's esophagus 09/30/2019   Cancer ([place])    Coronary artery disease involving native coronary artery of native heart 12/26/2017   Diabetes mellitus due to underlying condition with unspecified complications ([place]) [id number]   Type 2, on metformin   Dysrhythmia    Essential hypertension 11/20/2017   Ex-smoker 11/20/2017   Glaucoma 09/30/2019   Headache 11/10/2020   Hyperlipidemia 11/20/2017   Hyperlipidemia associated with type 2 diabetes mellitus ([place]) 10/2017   Hypertension associated with diabetes ([place]) 11/20/2017   Hypothyroidism 10/17/2019   Iliotibial band syndrome of left side 09/09/2015   Lacunar infarction ([place]) 02/19/2020   Palpitations 08/12/2019   Paroxysmal atrial fibrillation ([place]) 10/17/2019   Pleural effusion on left 10/17/2019   Pulmonary embolism ([place]) 11/10/2020   Renal calculi 09/30/2019   Skin cancer of arm, left    Syncope 02/19/2020    Past Surgical History:  Procedure Laterality Date   CHOLECYSTECTOMY     CORONARY ARTERY BYPASS GRAFT N/A 10/02/2019  Procedure: CORONARY ARTERY BYPASS GRAFTING (CABG) TIMES TWO, ON PUMP, USING LEFT INTERNAL MAMMARY ARTERY AND RIGHT GREATER SAPHENOUS VEIN HARVESTED ENDOSCOPICALLY;  Surgeon: [name], MD;  Location: [facility];  Service: Open Heart Surgery;  Laterality: N/A;  LIMA to LAD, SVG to OM 1   INTRAVASCULAR PRESSURE WIRE/FFR STUDY N/A 11/23/2017   Procedure: INTRAVASCULAR PRESSURE WIRE/FFR  STUDY;  Surgeon: Jettie Booze, MD;  Location: [facility];  Service: Cardiovascular;  Laterality: N/A;   IR THORACENTESIS ASP PLEURAL SPACE W/IMG GUIDE  12/04/2019   LEFT HEART CATH AND CORONARY ANGIOGRAPHY N/A 11/23/2017   Procedure: LEFT HEART CATH AND CORONARY ANGIOGRAPHY;  Surgeon: Jettie Booze, MD;  Location: [facility];  Service: Cardiovascular;  Laterality: N/A;   LEFT HEART CATH AND CORONARY ANGIOGRAPHY N/A 09/30/2019   Procedure: LEFT HEART CATH AND CORONARY ANGIOGRAPHY;  Surgeon: Troy Sine, MD;  Location: [facility];  Service: Cardiovascular;  Laterality: N/A;   MEDIASTINAL EXPLORATION N/A 10/03/2019   Procedure: MEDIASTINAL WASHOUT;  Surgeon: [name], MD;  Location: [facility];  Service: Thoracic;  Laterality: N/A;   TEE WITHOUT CARDIOVERSION N/A 10/02/2019   Procedure: TRANSESOPHAGEAL ECHOCARDIOGRAM (TEE);  Surgeon: [name], MD;  Location: [facility];  Service: Open Heart Surgery;  Laterality: N/A;   TUBAL LIGATION      Current Medications: Current Meds  Medication Sig   acetaminophen (TYLENOL) 650 MG CR tablet Take 650 mg by mouth every 8 (eight) hours as needed for pain.   amiodarone (PACERONE) 200 MG tablet Take 1 tablet (200 mg total) by mouth 2 (two) times daily.   amLODipine (NORVASC) 10 MG tablet Take 1 tablet (10 mg total) by mouth daily.   APIXABAN (ELIQUIS) VTE STARTER PACK ('10MG'$  AND '5MG'$ ) Take as directed on package: start with two-'5mg'$  tablets twice daily for 7 days. On day 8, switch to one-'5mg'$  tablet twice daily.   atorvastatin (LIPITOR) 80 MG tablet Take 80 mg by mouth daily.   hydrALAZINE (APRESOLINE) 50 MG tablet Take 1 tablet (50 mg total) by mouth every 8 (eight) hours.   levothyroxine (SYNTHROID, LEVOTHROID) 50 MCG tablet Take 50 mcg by mouth daily before breakfast.    metFORMIN (GLUCOPHAGE) 500 MG tablet Take 500 mg by mouth daily with breakfast.    Nutritional Supplements (BOOST HIGH PROTEIN PO) Take 1 Can by  mouth in the morning and at bedtime.   omeprazole (PRILOSEC) 40 MG capsule Take 1 capsule (40 mg total) by mouth daily.   PARoxetine (PAXIL) 10 MG tablet Take 10 mg by mouth daily.     Allergies:   Patient has no known allergies.   Social History   Socioeconomic History   Marital status: Unknown    Spouse name: Not on file   Number of children: Not on file   Years of education: Not on file   Highest education level: Not on file  Occupational History   Not on file  Tobacco Use   Smoking status: Former   Smokeless tobacco: Never  Vaping Use   Vaping Use: Never used  Substance and Sexual Activity   Alcohol use: Not Currently   Drug use: Never   Sexual activity: Not on file  Other Topics Concern   Not on file  Social History Narrative   Not on file   Social Determinants of Health   Financial Resource Strain: Not on file  Food Insecurity: Not on file  Transportation Needs: Not on file  Physical  Activity: Not on file  Stress: Not on file  Social Connections: Not on file     Family History: The patient's family history includes Heart attack in her maternal grandfather, maternal grandmother, and maternal uncle; Hypertension in her sister; Leukemia in her mother; Lung cancer in her father.  ROS:   Please see the history of present illness.    All other systems reviewed and are negative.  EKGs/Labs/Other Studies Reviewed:    The following studies were reviewed today: IMPRESSIONS     1. Left ventricular ejection fraction, by estimation, is 50 to 55%. The  left ventricle has low normal function. The left ventricle has no regional  wall motion abnormalities. There is mild concentric left ventricular  hypertrophy. Left ventricular  diastolic parameters are consistent with Grade I diastolic dysfunction  (impaired relaxation).   2. Right ventricular systolic function is normal. The right ventricular  size is normal. Tricuspid regurgitation signal is inadequate for  assessing  PA pressure.   3. The mitral valve is normal in structure. No evidence of mitral valve  regurgitation.   4. The aortic valve is tricuspid. There is mild thickening of the aortic  valve. Aortic valve regurgitation is not visualized. Mild aortic valve  sclerosis is present, with no evidence of aortic valve stenosis.   5. The inferior vena cava is normal in size with greater than 50%  respiratory variability, suggesting right atrial pressure of 3 mmHg.    Recent Labs: 06/14/2020: ALT 21 11/07/2020: TSH 1.714 11/08/2020: Magnesium 1.8 11/10/2020: BUN 20; Creatinine, Ser 1.41; Hemoglobin 13.2; Platelets 324; Potassium 3.8; Sodium 139  Recent Lipid Panel    Component Value Date/Time   CHOL 186 11/09/2020 0027   CHOL 201 (H) 06/14/2020 1055   TRIG 100 11/09/2020 0027   HDL 62 11/09/2020 0027   HDL 78 06/14/2020 1055   CHOLHDL 3.0 11/09/2020 0027   VLDL 20 11/09/2020 0027   LDLCALC 104 (H) 11/09/2020 0027   LDLCALC 109 (H) 06/14/2020 1055    Physical Exam:    VS:  BP 140/62   Pulse 66   Ht '5\' 6"'$  (1.676 m)   Wt 155 lb 9.6 oz (70.6 kg)   SpO2 98%   BMI 25.11 kg/m     Wt Readings from Last 3 Encounters:  12/01/20 155 lb 9.6 oz (70.6 kg)  11/17/20 151 lb 6.4 oz (68.7 kg)  11/10/20 149 lb 0.5 oz (67.6 kg)     GEN: Patient is in no acute distress HEENT: Normal NECK: No JVD; No carotid bruits LYMPHATICS: No lymphadenopathy CARDIAC: Hear sounds regular, 2/6 systolic murmur at the apex. RESPIRATORY:  Clear to auscultation without rales, wheezing or rhonchi  ABDOMEN: Soft, non-tender, non-distended MUSCULOSKELETAL:  No edema; No deformity  SKIN: Warm and dry NEUROLOGIC:  Alert and oriented x 3 PSYCHIATRIC:  Normal affect   Signed, Jenean Lindau, MD  12/01/2020 1:49 PM    [facility]

## 2020-12-11 ENCOUNTER — Other Ambulatory Visit: Payer: Self-pay | Admitting: Medical

## 2020-12-13 ENCOUNTER — Telehealth: Payer: Self-pay | Admitting: Cardiology

## 2020-12-13 MED ORDER — APIXABAN 5 MG PO TABS: 5.0000 mg | ORAL_TABLET | Freq: Two times a day (BID) | ORAL | 3 refills | Status: DC

## 2020-12-13 NOTE — Telephone Encounter (Signed)
Refill sent in per request.  

## 2020-12-13 NOTE — Telephone Encounter (Signed)
*  STAT* If patient is at the pharmacy, call can be transferred to refill team.   1. Which medications need to be refilled? (please list name of each medication and dose if known) APIXABAN (ELIQUIS) VTE STARTER PACK ('10MG'$  AND '5MG'$ )  APIXABAN (ELIQUIS) VTE STARTER PACK ('10MG'$  AND '5MG'$ )  2. Which pharmacy/location (including street and city if local pharmacy) is medication to be sent to? [place], [place] [address]  3. Do they need a 30 day or 90 day supply? [place]

## 2020-12-20 ENCOUNTER — Ambulatory Visit (INDEPENDENT_AMBULATORY_CARE_PROVIDER_SITE_OTHER): Payer: Medicare Other

## 2020-12-20 ENCOUNTER — Telehealth: Payer: Self-pay | Admitting: Internal Medicine

## 2020-12-20 DIAGNOSIS — I480 Paroxysmal atrial fibrillation: Secondary | ICD-10-CM | POA: Diagnosis not present

## 2020-12-20 NOTE — Telephone Encounter (Signed)
    Pt said she's been feeling tired lately and feels like she is going to throw up. She wanted to know if there's something wrong with her through her transmission went today

## 2020-12-20 NOTE — Telephone Encounter (Signed)
Patient reports feeling nauseated in the am after taking her synthroid. Was concerned something was showing on ILR. No alerts triggered. Presenting rhythm appears stable. Patient advised to follow up with PCP. Agreeable to plan.

## 2020-12-21 LAB — CUP PACEART REMOTE DEVICE CHECK
Date Time Interrogation Session: 20220822195429
Implantable Pulse Generator Implant Date: 20220720

## 2020-12-23 ENCOUNTER — Telehealth: Payer: Self-pay | Admitting: Cardiology

## 2020-12-23 NOTE — Telephone Encounter (Signed)
Patient called to say for the last week or so she has been waking up being nausea. She thinks that she needs to have her blood check. Please advise

## 2020-12-23 NOTE — Telephone Encounter (Signed)
Recommendations reviewed with pt as per Dr. Revankar's note.  Pt verbalized understanding and had no additional questions.   

## 2020-12-23 NOTE — Telephone Encounter (Signed)
Pt states that she has been having nausea with some vomiting the past week. She states she also breaks out into a sweat and doesn't feel well at that time. Pt states that she feels she needs labs as we did not do any at her last appointment. Pt states she feels like something is wrong. How do you advise?

## 2020-12-31 ENCOUNTER — Encounter: Payer: Medicare Other | Admitting: Student

## 2021-01-03 NOTE — Progress Notes (Deleted)
Electrophysiology Office Note Date: 01/03/2021  ID:  Courtney Gardner, Courtney Gardner [birth year], MRN [id number]  PCP: Maylon Cos, NP Primary Cardiologist: Jenean Lindau, MD Electrophysiologist: Virl Axe, MD   CC: ILR follow-up  Courtney Gardner is a 70 y.o. female seen today for Dr. Caryl Comes . she presents today for routine electrophysiology followup.  Since last being seen in our clinic, the patient reports doing very well.  she denies chest pain, palpitations, dyspnea, PND, orthopnea, nausea, vomiting, dizziness, syncope, edema, weight gain, or early satiety.  Device History: Medtronic loop recorder implanted 10/2020 for syncope  Past Medical History:  Diagnosis Date   Abdominal bruit 12/12/2019   AKI (acute kidney injury) ([place]) 10/17/2019   Angina pectoris ([place]) 11/20/2017   Barrett's esophagus 09/30/2019   Cancer ([place])    Coronary artery disease involving native coronary artery of native heart 12/26/2017   Diabetes mellitus due to underlying condition with unspecified complications ([place]) [id number]   Type 2, on metformin   Dysrhythmia    Essential hypertension 11/20/2017   Ex-smoker 11/20/2017   Glaucoma 09/30/2019   Headache 11/10/2020   Hyperlipidemia 11/20/2017   Hyperlipidemia associated with type 2 diabetes mellitus ([place]) 10/2017   Hypertension associated with diabetes ([place]) 11/20/2017   Hypothyroidism 10/17/2019   Iliotibial band syndrome of left side 09/09/2015   Lacunar infarction ([place]) 02/19/2020   Palpitations 08/12/2019   Paroxysmal atrial fibrillation ([place]) 10/17/2019   Pleural effusion on left 10/17/2019   Pulmonary embolism ([place]) 11/10/2020   Renal calculi 09/30/2019   Skin cancer of arm, left    Syncope 02/19/2020   Past Surgical History:  Procedure Laterality Date   CHOLECYSTECTOMY     CORONARY ARTERY BYPASS GRAFT N/A 10/02/2019   Procedure: CORONARY ARTERY BYPASS GRAFTING (CABG) TIMES TWO, ON PUMP, USING LEFT INTERNAL MAMMARY ARTERY AND RIGHT GREATER  SAPHENOUS VEIN HARVESTED ENDOSCOPICALLY;  Surgeon: [name], MD;  Location: [facility];  Service: Open Heart Surgery;  Laterality: N/A;  LIMA to LAD, SVG to OM 1   INTRAVASCULAR PRESSURE WIRE/FFR STUDY N/A 11/23/2017   Procedure: INTRAVASCULAR PRESSURE WIRE/FFR STUDY;  Surgeon: Jettie Booze, MD;  Location: [facility];  Service: Cardiovascular;  Laterality: N/A;   IR THORACENTESIS ASP PLEURAL SPACE W/IMG GUIDE  12/04/2019   LEFT HEART CATH AND CORONARY ANGIOGRAPHY N/A 11/23/2017   Procedure: LEFT HEART CATH AND CORONARY ANGIOGRAPHY;  Surgeon: Jettie Booze, MD;  Location: [facility];  Service: Cardiovascular;  Laterality: N/A;   LEFT HEART CATH AND CORONARY ANGIOGRAPHY N/A 09/30/2019   Procedure: LEFT HEART CATH AND CORONARY ANGIOGRAPHY;  Surgeon: Troy Sine, MD;  Location: [facility];  Service: Cardiovascular;  Laterality: N/A;   MEDIASTINAL EXPLORATION N/A 10/03/2019   Procedure: MEDIASTINAL WASHOUT;  Surgeon: [name], MD;  Location: [facility];  Service: Thoracic;  Laterality: N/A;   TEE WITHOUT CARDIOVERSION N/A 10/02/2019   Procedure: TRANSESOPHAGEAL ECHOCARDIOGRAM (TEE);  Surgeon: [name], MD;  Location: [facility];  Service: Open Heart Surgery;  Laterality: N/A;   TUBAL LIGATION      Current Outpatient Medications  Medication Sig Dispense Refill   acetaminophen (TYLENOL) 650 MG CR tablet Take 650 mg by mouth every 8 (eight) hours as needed for pain.     amiodarone (PACERONE) 200 MG tablet Take 1 tablet (200 mg total) by mouth 2 (two) times daily. 60 tablet 1   amLODipine (NORVASC) 10 MG tablet Take 1 tablet by mouth once daily  90 tablet 0   apixaban (ELIQUIS) 5 MG TABS tablet Take 1 tablet (5 mg total) by mouth 2 (two) times daily. 60 tablet 3   atorvastatin (LIPITOR) 80 MG tablet Take 80 mg by mouth daily.     ezetimibe (ZETIA) 10 MG tablet Take 1 tablet (10 mg total) by mouth daily. 90 tablet 3   hydrALAZINE (APRESOLINE) 50 MG  tablet Take 1 tablet (50 mg total) by mouth every 8 (eight) hours. 90 tablet 6   levothyroxine (SYNTHROID, LEVOTHROID) 50 MCG tablet Take 50 mcg by mouth daily before breakfast.   2   metFORMIN (GLUCOPHAGE) 500 MG tablet Take 500 mg by mouth daily with breakfast.      nitroGLYCERIN (NITROSTAT) 0.4 MG SL tablet Place 1 tablet (0.4 mg total) under the tongue every 5 (five) minutes as needed for chest pain. 25 tablet 8   Nutritional Supplements (BOOST HIGH PROTEIN PO) Take 1 Can by mouth in the morning and at bedtime.     omeprazole (PRILOSEC) 40 MG capsule Take 1 capsule (40 mg total) by mouth daily. 90 capsule 2   PARoxetine (PAXIL) 10 MG tablet Take 10 mg by mouth daily.     No current facility-administered medications for this visit.    Allergies:   Patient has no known allergies.   Social History: Social History   Socioeconomic History   Marital status: Unknown    Spouse name: Not on file   Number of children: Not on file   Years of education: Not on file   Highest education level: Not on file  Occupational History   Not on file  Tobacco Use   Smoking status: Former   Smokeless tobacco: Never  Vaping Use   Vaping Use: Never used  Substance and Sexual Activity   Alcohol use: Not Currently   Drug use: Never   Sexual activity: Not on file  Other Topics Concern   Not on file  Social History Narrative   Not on file   Social Determinants of Health   Financial Resource Strain: Not on file  Food Insecurity: Not on file  Transportation Needs: Not on file  Physical Activity: Not on file  Stress: Not on file  Social Connections: Not on file  Intimate Partner Violence: Not on file    Family History: Family History  Problem Relation Age of Onset   Leukemia Mother    Lung cancer Father    Hypertension Sister    Heart attack Maternal Grandmother    Heart attack Maternal Grandfather    Heart attack Maternal Uncle      Review of Systems: All other systems reviewed and  are otherwise negative except as noted above.  Physical Exam: There were no vitals filed for this visit.   GEN- The patient is well appearing, alert and oriented x 3 today.   HEENT: normocephalic, atraumatic; sclera clear, conjunctiva pink; hearing intact; oropharynx clear; neck supple  Lungs- Clear to ausculation bilaterally, normal work of breathing.  No wheezes, rales, rhonchi Heart- Regular rate and rhythm, no murmurs, rubs or gallops  GI- soft, non-tender, non-distended, bowel sounds present  Extremities- no clubbing, cyanosis, or edema  MS- no significant deformity or atrophy Skin- warm and dry, no rash or lesion; PPM pocket well healed Psych- euthymic mood, full affect Neuro- strength and sensation are intact  PPM Interrogation- reviewed in detail today,  See PACEART report  EKG:  EKG is not ordered today.  Recent Labs: 06/14/2020: ALT 21 11/07/2020: TSH 1.714  11/08/2020: Magnesium 1.8 11/10/2020: BUN 20; Creatinine, Ser 1.41; Hemoglobin 13.2; Platelets 324; Potassium 3.8; Sodium 139   Wt Readings from Last 3 Encounters:  12/01/20 155 lb 9.6 oz (70.6 kg)  11/17/20 151 lb 6.4 oz (68.7 kg)  11/10/20 149 lb 0.5 oz (67.6 kg)     Other studies Reviewed: Additional studies/ records that were reviewed today include: Echo 10/2020 shows LVEF 50-55%, Previous EP office notes, Previous remote checks, Most recent labwork.   Assessment and Plan:  Atrial fibrillation/flutter   Syncope   High risk medication surveillance   Coronary artery disease with prior bypass  *** by ILR report  Continue amiodarone and eliquis. Surveillance labs today.   Current medicines are reviewed at length with the patient today.   The patient {ACTIONS; HAS/DOES NOT HAVE:19233} concerns regarding her medicines.  The following changes were made today:  {NONE DEFAULTED:18576}  Labs/ tests ordered today include: *** No orders of the defined types were placed in this encounter.    Disposition:    Follow up with {Blank single:19197::"Dr. Allred","Dr. Arlan Organ. Klein","Dr. Camnitz","EP APP"} in *** {Blank single:19197::"Months","Weeks"}    Signed, Shirley Friar, PA-C  01/03/2021 12:56 PM  [place] [address] [phone] (office) [phone] (fax)

## 2021-01-04 ENCOUNTER — Encounter: Payer: Medicare Other | Admitting: Student

## 2021-01-04 DIAGNOSIS — I480 Paroxysmal atrial fibrillation: Secondary | ICD-10-CM

## 2021-01-04 DIAGNOSIS — I10 Essential (primary) hypertension: Secondary | ICD-10-CM

## 2021-01-04 DIAGNOSIS — I2510 Atherosclerotic heart disease of native coronary artery without angina pectoris: Secondary | ICD-10-CM

## 2021-01-04 DIAGNOSIS — R55 Syncope and collapse: Secondary | ICD-10-CM

## 2021-01-05 NOTE — Progress Notes (Signed)
[facility] Summary Report / Loop Recorder 

## 2021-01-12 ENCOUNTER — Other Ambulatory Visit: Payer: Self-pay | Admitting: Cardiology

## 2021-01-12 NOTE — Telephone Encounter (Signed)
This is a Technical sales engineer pt, Dr. Geraldo Pitter

## 2021-01-17 NOTE — Progress Notes (Signed)
Electrophysiology Office Note Date: 01/18/2021  ID:  Courtney Gardner, Courtney Gardner [birth year], MRN [id number]  PCP: Maylon Cos, NP Primary Cardiologist: Jenean Lindau, MD Electrophysiologist: Virl Axe, MD   CC: ILR follow-up  Courtney Gardner is a 70 y.o. female seen today for Dr. Caryl Comes. S presents today for routine electrophysiology followup.  Since last being seen in our clinic, the patient reports doing well from a cardiac perspective. No syncope or palpitations. She has had worsening nausea over the past several weeks. It has improved off of synthroid (she was not instructed to stop this). She is awaiting labs from PCP for further guidance. She is also awaiting to hear back from GI about referral, with history of Barrett's esophagus and gastritis. She is very reassured by no episodes on her loop.   Device History: Medtronic loop recorder implanted 11/17/2020 for syncope  Past Medical History:  Diagnosis Date   Abdominal bruit 12/12/2019   AKI (acute kidney injury) ([place]) 10/17/2019   Angina pectoris ([place]) 11/20/2017   Barrett's esophagus 09/30/2019   Cancer ([place])    Coronary artery disease involving native coronary artery of native heart 12/26/2017   Diabetes mellitus due to underlying condition with unspecified complications ([place]) 74/16/3845   Type 2, on metformin   Dysrhythmia    Essential hypertension 11/20/2017   Ex-smoker 11/20/2017   Glaucoma 09/30/2019   Headache 11/10/2020   Hyperlipidemia 11/20/2017   Hyperlipidemia associated with type 2 diabetes mellitus ([place]) 10/2017   Hypertension associated with diabetes ([place]) 11/20/2017   Hypothyroidism 10/17/2019   Iliotibial band syndrome of left side 09/09/2015   Lacunar infarction ([place]) 02/19/2020   Palpitations 08/12/2019   Paroxysmal atrial fibrillation ([place]) 10/17/2019   Pleural effusion on left 10/17/2019   Pulmonary embolism ([place]) 11/10/2020   Renal calculi 09/30/2019   Skin cancer of arm, left    Syncope  02/19/2020   Past Surgical History:  Procedure Laterality Date   CHOLECYSTECTOMY     CORONARY ARTERY BYPASS GRAFT N/A 10/02/2019   Procedure: CORONARY ARTERY BYPASS GRAFTING (CABG) TIMES TWO, ON PUMP, USING LEFT INTERNAL MAMMARY ARTERY AND RIGHT GREATER SAPHENOUS VEIN HARVESTED ENDOSCOPICALLY;  Surgeon: [name], MD;  Location: [facility];  Service: Open Heart Surgery;  Laterality: N/A;  LIMA to LAD, SVG to OM 1   INTRAVASCULAR PRESSURE WIRE/FFR STUDY N/A 11/23/2017   Procedure: INTRAVASCULAR PRESSURE WIRE/FFR STUDY;  Surgeon: Jettie Booze, MD;  Location: [facility];  Service: Cardiovascular;  Laterality: N/A;   IR THORACENTESIS ASP PLEURAL SPACE W/IMG GUIDE  12/04/2019   LEFT HEART CATH AND CORONARY ANGIOGRAPHY N/A 11/23/2017   Procedure: LEFT HEART CATH AND CORONARY ANGIOGRAPHY;  Surgeon: Jettie Booze, MD;  Location: [facility];  Service: Cardiovascular;  Laterality: N/A;   LEFT HEART CATH AND CORONARY ANGIOGRAPHY N/A 09/30/2019   Procedure: LEFT HEART CATH AND CORONARY ANGIOGRAPHY;  Surgeon: Troy Sine, MD;  Location: [facility];  Service: Cardiovascular;  Laterality: N/A;   MEDIASTINAL EXPLORATION N/A 10/03/2019   Procedure: MEDIASTINAL WASHOUT;  Surgeon: [name], MD;  Location: [facility];  Service: Thoracic;  Laterality: N/A;   TEE WITHOUT CARDIOVERSION N/A 10/02/2019   Procedure: TRANSESOPHAGEAL ECHOCARDIOGRAM (TEE);  Surgeon: [name], MD;  Location: [facility];  Service: Open Heart Surgery;  Laterality: N/A;   TUBAL LIGATION      Current Outpatient Medications  Medication Sig Dispense Refill   acetaminophen (TYLENOL) 650 MG CR tablet Take 650 mg  by mouth every 8 (eight) hours as needed for pain.     amiodarone (PACERONE) 200 MG tablet Take 1 tablet by mouth twice daily 60 tablet 0   amLODipine (NORVASC) 10 MG tablet Take 1 tablet by mouth once daily 90 tablet 0   apixaban (ELIQUIS) 5 MG TABS tablet Take 1 tablet (5 mg total) by  mouth 2 (two) times daily. 60 tablet 3   atorvastatin (LIPITOR) 80 MG tablet Take 80 mg by mouth daily.     ezetimibe (ZETIA) 10 MG tablet Take 1 tablet (10 mg total) by mouth daily. 90 tablet 3   hydrALAZINE (APRESOLINE) 50 MG tablet Take 1 tablet (50 mg total) by mouth every 8 (eight) hours. 90 tablet 6   levothyroxine (SYNTHROID, LEVOTHROID) 50 MCG tablet Take 50 mcg by mouth daily before breakfast.   2   metFORMIN (GLUCOPHAGE) 500 MG tablet Take 500 mg by mouth daily with breakfast.      nitroGLYCERIN (NITROSTAT) 0.4 MG SL tablet Place 1 tablet (0.4 mg total) under the tongue every 5 (five) minutes as needed for chest pain. 25 tablet 8   Nutritional Supplements (BOOST HIGH PROTEIN PO) Take 1 Can by mouth in the morning and at bedtime.     omeprazole (PRILOSEC) 40 MG capsule Take 1 capsule (40 mg total) by mouth daily. 90 capsule 2   PARoxetine (PAXIL) 10 MG tablet Take 10 mg by mouth daily.     No current facility-administered medications for this visit.    Allergies:   Patient has no known allergies.   Social History: Social History   Socioeconomic History   Marital status: Unknown    Spouse name: Not on file   Number of children: Not on file   Years of education: Not on file   Highest education level: Not on file  Occupational History   Not on file  Tobacco Use   Smoking status: Former   Smokeless tobacco: Never  Vaping Use   Vaping Use: Never used  Substance and Sexual Activity   Alcohol use: Not Currently   Drug use: Never   Sexual activity: Not on file  Other Topics Concern   Not on file  Social History Narrative   Not on file   Social Determinants of Health   Financial Resource Strain: Not on file  Food Insecurity: Not on file  Transportation Needs: Not on file  Physical Activity: Not on file  Stress: Not on file  Social Connections: Not on file  Intimate Partner Violence: Not on file    Family History: Family History  Problem Relation Age of Onset    Leukemia Mother    Lung cancer Father    Hypertension Sister    Heart attack Maternal Grandmother    Heart attack Maternal Grandfather    Heart attack Maternal Uncle      Review of Systems: All other systems reviewed and are otherwise negative except as noted above.  Physical Exam: Vitals:   01/18/21 1141  BP: 130/68  Pulse: 79  SpO2: 99%  Weight: 153 lb (69.4 kg)  Height: 5\' 6"  (1.676 m)     GEN- The patient is well appearing, alert and oriented x 3 today.   HEENT: normocephalic, atraumatic; sclera clear, conjunctiva pink; hearing intact; oropharynx clear; neck supple  Lungs- Clear to ausculation bilaterally, normal work of breathing.  No wheezes, rales, rhonchi Heart- Regular rate and rhythm, no murmurs, rubs or gallops  GI- soft, non-tender, non-distended, bowel sounds present  Extremities-  no clubbing, cyanosis, or edema  MS- no significant deformity or atrophy Skin- warm and dry, no rash or lesion; PPM pocket well healed Psych- euthymic mood, full affect Neuro- strength and sensation are intact  PPM Interrogation- reviewed in detail today on [facility]. Not interrogated in person. 0 episodes.   EKG:  EKG is not ordered today.  Recent Labs: 06/14/2020: ALT 21 11/07/2020: TSH 1.714 11/08/2020: Magnesium 1.8 11/10/2020: BUN 20; Creatinine, Ser 1.41; Hemoglobin 13.2; Platelets 324; Potassium 3.8; Sodium 139   Wt Readings from Last 3 Encounters:  01/18/21 153 lb (69.4 kg)  12/01/20 155 lb 9.6 oz (70.6 kg)  11/17/20 151 lb 6.4 oz (68.7 kg)     Other studies Reviewed: Additional studies/ records that were reviewed today include: Previous EP office notes, Previous remote checks, Most recent labwork.   Assessment and Plan:  1. Syncope s/p Medtronic Loop recorder Normal device function See Pace Art report No changes today  2. Paroxysmal atrial fibrillation / flutter 0% burden by device Continue eliquis for CHA2DS2/VASc of at least 5. Decrease amidoarone to 200 mg  daily. Surveillance labs supposedly ordered by PCP last week. We will request. If incomplete, will order remainder.    3. CAD s/p CABG No s/s ischemia at this time  4. Complex GI history With intermittent Nausea/Vomiting, history of gastritis, and Barrett's esophagus She is waiting to hear back on a GI referral which has to come through PCP per insurance.  Encouraged prompt follow up.  Decreasing amiodarone may also help with her nausea.   5. PE She is on Eliquis.  With CHA2DS2-VASc of at least 5 and PAF, will likely remain on lifelong.  She has been told this will be check 6 months post. She sees Dr. Geraldo Pitter at 5 month mark who can help arrange f/u.   Current medicines are reviewed at length with the patient today.    Labs/ tests pending through PCP.    Disposition:   Follow up with Dr. Caryl Comes in 6 Months. Sooner with issues.     Jacalyn Lefevre, PA-C  01/18/2021 11:53 AM  [facility] [address] [place] [address] [phone] (office) [phone] (fax)

## 2021-01-18 ENCOUNTER — Ambulatory Visit (INDEPENDENT_AMBULATORY_CARE_PROVIDER_SITE_OTHER): Payer: Medicare Other | Admitting: Student

## 2021-01-18 ENCOUNTER — Other Ambulatory Visit: Payer: Self-pay

## 2021-01-18 ENCOUNTER — Encounter: Payer: Self-pay | Admitting: Student

## 2021-01-18 VITALS — BP 130/68 | HR 79 | Ht 66.0 in | Wt 153.0 lb

## 2021-01-18 DIAGNOSIS — I10 Essential (primary) hypertension: Secondary | ICD-10-CM

## 2021-01-18 DIAGNOSIS — I480 Paroxysmal atrial fibrillation: Secondary | ICD-10-CM

## 2021-01-18 DIAGNOSIS — R55 Syncope and collapse: Secondary | ICD-10-CM

## 2021-01-18 DIAGNOSIS — I2510 Atherosclerotic heart disease of native coronary artery without angina pectoris: Secondary | ICD-10-CM | POA: Diagnosis not present

## 2021-01-18 MED ORDER — AMIODARONE HCL 200 MG PO TABS: 200.0000 mg | ORAL_TABLET | Freq: Every day | ORAL | 3 refills | Status: DC

## 2021-01-18 NOTE — Patient Instructions (Addendum)
Medication Instructions:  Your physician has recommended you make the following change in your medication:   DECREASE: Amiodarone to 200mg  daily  *If you need a refill on your cardiac medications before your next appointment, please call your pharmacy*   Lab Work: None If you have labs (blood work) drawn today and your tests are completely normal, you will receive your results only by: [place] (if you have MyChart) OR A paper copy in the mail If you have any lab test that is abnormal or we need to change your treatment, we will call you to review the results.  Follow-Up: At [facility], you and your health needs are our priority.  As part of our continuing mission to provide you with exceptional heart care, we have created designated Provider Care Teams.  These Care Teams include your primary Cardiologist (physician) and Advanced Practice Providers (APPs -  Physician Assistants and Nurse Practitioners) who all work together to provide you with the care you need, when you need it.  We recommend signing up for the patient portal called "MyChart".  Sign up information is provided on this After Visit Summary.  MyChart is used to connect with patients for Virtual Visits (Telemedicine).  Patients are able to view lab/test results, encounter notes, upcoming appointments, etc.  Non-urgent messages can be sent to your provider as well.   To learn more about what you can do with MyChart, go to [email or web address].    Your next appointment:   6 month(s)  The format for your next appointment:   In Person  Provider:   You may see Virl Axe, MD or one of the following Advanced Practice Providers on your designated Care Team:   Tommye Standard, [place] "[facility]" [place], [place]

## 2021-01-24 ENCOUNTER — Ambulatory Visit (INDEPENDENT_AMBULATORY_CARE_PROVIDER_SITE_OTHER): Payer: Medicare Other

## 2021-01-24 DIAGNOSIS — I480 Paroxysmal atrial fibrillation: Secondary | ICD-10-CM | POA: Diagnosis not present

## 2021-01-24 LAB — CUP PACEART REMOTE DEVICE CHECK
Date Time Interrogation Session: 20220924195821
Implantable Pulse Generator Implant Date: 20220720

## 2021-01-31 NOTE — Progress Notes (Signed)
[facility] Summary Report / Loop Recorder 

## 2021-02-28 ENCOUNTER — Ambulatory Visit (INDEPENDENT_AMBULATORY_CARE_PROVIDER_SITE_OTHER): Payer: Medicare Other

## 2021-02-28 DIAGNOSIS — I480 Paroxysmal atrial fibrillation: Secondary | ICD-10-CM

## 2021-02-28 LAB — CUP PACEART REMOTE DEVICE CHECK
Date Time Interrogation Session: 20221027195817
Implantable Pulse Generator Implant Date: 20220720

## 2021-03-07 NOTE — Progress Notes (Signed)
[facility] Summary Report / Loop Recorder 

## 2021-03-09 ENCOUNTER — Other Ambulatory Visit: Payer: Self-pay | Admitting: Medical

## 2021-03-09 NOTE — Telephone Encounter (Signed)
This is Dr. Revankar's pt 

## 2021-04-06 ENCOUNTER — Ambulatory Visit (INDEPENDENT_AMBULATORY_CARE_PROVIDER_SITE_OTHER): Payer: Medicare Other | Admitting: Cardiology

## 2021-04-06 ENCOUNTER — Other Ambulatory Visit: Payer: Self-pay

## 2021-04-06 ENCOUNTER — Encounter: Payer: Self-pay | Admitting: Cardiology

## 2021-04-06 VITALS — BP 146/68 | HR 73 | Ht 66.0 in | Wt 149.8 lb

## 2021-04-06 DIAGNOSIS — E1169 Type 2 diabetes mellitus with other specified complication: Secondary | ICD-10-CM | POA: Diagnosis not present

## 2021-04-06 DIAGNOSIS — I480 Paroxysmal atrial fibrillation: Secondary | ICD-10-CM | POA: Diagnosis not present

## 2021-04-06 DIAGNOSIS — E785 Hyperlipidemia, unspecified: Secondary | ICD-10-CM

## 2021-04-06 DIAGNOSIS — E088 Diabetes mellitus due to underlying condition with unspecified complications: Secondary | ICD-10-CM

## 2021-04-06 DIAGNOSIS — I10 Essential (primary) hypertension: Secondary | ICD-10-CM

## 2021-04-06 DIAGNOSIS — I2510 Atherosclerotic heart disease of native coronary artery without angina pectoris: Secondary | ICD-10-CM | POA: Diagnosis not present

## 2021-04-06 MED ORDER — AMIODARONE HCL 200 MG PO TABS: 100.0000 mg | ORAL_TABLET | Freq: Every day | ORAL | 3 refills | Status: DC

## 2021-04-06 NOTE — Addendum Note (Signed)
Addended by: Truddie Hidden on: 04/06/2021 01:40 PM   Modules accepted: Orders

## 2021-04-06 NOTE — Progress Notes (Signed)
Cardiology Office Note:    Date:  04/06/2021   ID:  Courtney Gardner, DOB 01-Jan-1951, MRN [id number]  PCP:  Maylon Cos, NP  Cardiologist:  Jenean Lindau, MD   Referring MD: Maylon Cos, NP    ASSESSMENT:    1. Coronary artery disease involving native coronary artery of native heart without angina pectoris   2. Essential hypertension   3. Hyperlipidemia associated with type 2 diabetes mellitus (HCC)   4. Paroxysmal atrial fibrillation ([place])   5. Diabetes mellitus due to underlying condition with unspecified complications ([place])    PLAN:    In order of problems listed above:  Coronary artery disease: Primary prevention stressed with the patient.  Importance of compliance with diet medication stressed and she vocalized understanding.  She was advised to walk on a regular basis and she promises to do so. Essential hypertension: Blood pressure stable and diet was emphasized.  Lifestyle modification was stressed. Mixed dyslipidemia: Lipids followed by primary care.  Diet was emphasized. History of pulmonary embolism: On anticoagulation. History of paroxysmal atrial fibrillation: Patient has a Linq device which is being monitored.  No episodes of atrial fibrillation have been noted so far. History of nausea and vomiting on and off.  Patient is being evaluated by gastroenterology for this reason..  I will cut down amiodarone to 100 mg daily.  I will also get a Chem-7 and liver function test and amiodarone level today.  Patient is being evaluated by Dr. Lyda Jester gastroenterologist.  There were plans for endoscopy.  I wonder if he could proceed with endoscopy.  We could always consider holding the Eliquis and switching her to Lovenox and bridging her around the procedure time.  If Dr. Lyda Jester is okay to do this we will be glad to assist him in doing the bridging and planning for it in the periprocedural period. Patient will be seen in follow-up appointment in 3 months or  earlier if the patient has any concerns    Medication Adjustments/Labs and Tests Ordered: Current medicines are reviewed at length with the patient today.  Concerns regarding medicines are outlined above.  No orders of the defined types were placed in this encounter.  No orders of the defined types were placed in this encounter.    No chief complaint on file.    History of Present Illness:    Courtney Gardner is a 70 y.o. female.  Patient has past medical history of coronary artery disease, essential hypertension and mixed dyslipidemia.  She has paroxysmal atrial fibrillation and has had history of pulmonary embolism.  She denies any chest pain orthopnea or PND.  She has had significant issues with nausea.  At the time of my evaluation, the patient is alert awake oriented and in no distress.  Past Medical History:  Diagnosis Date   Abdominal bruit 12/12/2019   AKI (acute kidney injury) ([place]) 10/17/2019   Angina pectoris ([place]) 11/20/2017   Barrett's esophagus 09/30/2019   Cancer ([place])    Coronary artery disease involving native coronary artery of native heart 12/26/2017   Diabetes mellitus due to underlying condition with unspecified complications ([place]) 02/54/2706   Type 2, on metformin   Dysrhythmia    Essential hypertension 11/20/2017   Ex-smoker 11/20/2017   Glaucoma 09/30/2019   Headache 11/10/2020   Hyperlipidemia 11/20/2017   Hyperlipidemia associated with type 2 diabetes mellitus ([place]) 10/2017   Hypertension associated with diabetes ([place]) 11/20/2017   Hypothyroidism 10/17/2019   Iliotibial band syndrome of left  side 09/09/2015   Lacunar infarction ([place]) 02/19/2020   Palpitations 08/12/2019   Paroxysmal atrial fibrillation ([place]) 10/17/2019   Pleural effusion on left 10/17/2019   Pulmonary embolism ([place]) 11/10/2020   Renal calculi 09/30/2019   Skin cancer of arm, left    Syncope 02/19/2020    Past Surgical History:  Procedure Laterality Date   CHOLECYSTECTOMY      CORONARY ARTERY BYPASS GRAFT N/A 10/02/2019   Procedure: CORONARY ARTERY BYPASS GRAFTING (CABG) TIMES TWO, ON PUMP, USING LEFT INTERNAL MAMMARY ARTERY AND RIGHT GREATER SAPHENOUS VEIN HARVESTED ENDOSCOPICALLY;  Surgeon: [name], MD;  Location: [facility];  Service: Open Heart Surgery;  Laterality: N/A;  LIMA to LAD, SVG to OM 1   INTRAVASCULAR PRESSURE WIRE/FFR STUDY N/A 11/23/2017   Procedure: INTRAVASCULAR PRESSURE WIRE/FFR STUDY;  Surgeon: Jettie Booze, MD;  Location: [facility];  Service: Cardiovascular;  Laterality: N/A;   IR THORACENTESIS ASP PLEURAL SPACE W/IMG GUIDE  12/04/2019   LEFT HEART CATH AND CORONARY ANGIOGRAPHY N/A 11/23/2017   Procedure: LEFT HEART CATH AND CORONARY ANGIOGRAPHY;  Surgeon: Jettie Booze, MD;  Location: [facility];  Service: Cardiovascular;  Laterality: N/A;   LEFT HEART CATH AND CORONARY ANGIOGRAPHY N/A 09/30/2019   Procedure: LEFT HEART CATH AND CORONARY ANGIOGRAPHY;  Surgeon: Troy Sine, MD;  Location: [facility];  Service: Cardiovascular;  Laterality: N/A;   MEDIASTINAL EXPLORATION N/A 10/03/2019   Procedure: MEDIASTINAL WASHOUT;  Surgeon: [name], MD;  Location: [facility];  Service: Thoracic;  Laterality: N/A;   TEE WITHOUT CARDIOVERSION N/A 10/02/2019   Procedure: TRANSESOPHAGEAL ECHOCARDIOGRAM (TEE);  Surgeon: [name], MD;  Location: [facility];  Service: Open Heart Surgery;  Laterality: N/A;   TUBAL LIGATION      Current Medications: Current Meds  Medication Sig   acetaminophen (TYLENOL) 650 MG CR tablet Take 650 mg by mouth every 8 (eight) hours as needed for pain.   amiodarone (PACERONE) 200 MG tablet Take 1 tablet (200 mg total) by mouth daily.   amLODipine (NORVASC) 10 MG tablet Take 1 tablet by mouth once daily   apixaban (ELIQUIS) 5 MG TABS tablet Take 1 tablet (5 mg total) by mouth 2 (two) times daily.   atorvastatin (LIPITOR) 80 MG tablet Take 80 mg by mouth daily.   ezetimibe (ZETIA) 10  MG tablet Take 10 mg by mouth daily.   hydrALAZINE (APRESOLINE) 50 MG tablet TAKE 1 TABLET BY MOUTH EVERY 8 HOURS   levothyroxine (SYNTHROID, LEVOTHROID) 50 MCG tablet Take 50 mcg by mouth daily before breakfast.    lisinopril (ZESTRIL) 20 MG tablet Take 20 mg by mouth daily.   metFORMIN (GLUCOPHAGE) 500 MG tablet Take 500 mg by mouth daily with breakfast.    nitroGLYCERIN (NITROSTAT) 0.4 MG SL tablet Place 0.4 mg under the tongue every 5 (five) minutes as needed for chest pain.   Nutritional Supplements (BOOST HIGH PROTEIN PO) Take 1 Can by mouth in the morning and at bedtime.   omeprazole (PRILOSEC) 40 MG capsule Take 1 capsule (40 mg total) by mouth daily.   PARoxetine (PAXIL) 10 MG tablet Take 10 mg by mouth daily.     Allergies:   Patient has no known allergies.   Social History   Socioeconomic History   Marital status: Unknown    Spouse name: Not on file   Number of children: Not on file   Years of education: Not on file   Highest education level: Not  on file  Occupational History   Not on file  Tobacco Use   Smoking status: Former   Smokeless tobacco: Never  Vaping Use   Vaping Use: Never used  Substance and Sexual Activity   Alcohol use: Not Currently   Drug use: Never   Sexual activity: Not on file  Other Topics Concern   Not on file  Social History Narrative   Not on file   Social Determinants of Health   Financial Resource Strain: Not on file  Food Insecurity: Not on file  Transportation Needs: Not on file  Physical Activity: Not on file  Stress: Not on file  Social Connections: Not on file     Family History: The patient's family history includes Heart attack in her maternal grandfather, maternal grandmother, and maternal uncle; Hypertension in her sister; Leukemia in her mother; Lung cancer in her father.  ROS:   Please see the history of present illness.    All other systems reviewed and are negative.  EKGs/Labs/Other Studies Reviewed:    The  following studies were reviewed today: EKG reveals sinus rhythm, left posterior fascicular block and nonspecific ST-T changes.   Recent Labs: 06/14/2020: ALT 21 11/07/2020: TSH 1.714 11/08/2020: Magnesium 1.8 11/10/2020: BUN 20; Creatinine, Ser 1.41; Hemoglobin 13.2; Platelets 324; Potassium 3.8; Sodium 139  Recent Lipid Panel    Component Value Date/Time   CHOL 186 11/09/2020 0027   CHOL 201 (H) 06/14/2020 1055   TRIG 100 11/09/2020 0027   HDL 62 11/09/2020 0027   HDL 78 06/14/2020 1055   CHOLHDL 3.0 11/09/2020 0027   VLDL 20 11/09/2020 0027   LDLCALC 104 (H) 11/09/2020 0027   LDLCALC 109 (H) 06/14/2020 1055    Physical Exam:    VS:  BP (!) 146/68   Pulse 73   Ht 5\' 6"  (1.676 m)   Wt 149 lb 12.8 oz (67.9 kg)   SpO2 96%   BMI 24.18 kg/m     Wt Readings from Last 3 Encounters:  04/06/21 149 lb 12.8 oz (67.9 kg)  01/18/21 153 lb (69.4 kg)  12/01/20 155 lb 9.6 oz (70.6 kg)     GEN: Patient is in no acute distress HEENT: Normal NECK: No JVD; No carotid bruits LYMPHATICS: No lymphadenopathy CARDIAC: Hear sounds regular, 2/6 systolic murmur at the apex. RESPIRATORY:  Clear to auscultation without rales, wheezing or rhonchi  ABDOMEN: Soft, non-tender, non-distended MUSCULOSKELETAL:  No edema; No deformity  SKIN: Warm and dry NEUROLOGIC:  Alert and oriented x 3 PSYCHIATRIC:  Normal affect   Signed, Jenean Lindau, MD  04/06/2021 1:17 PM    [facility]

## 2021-04-06 NOTE — Patient Instructions (Signed)
Medication Instructions:  Your physician recommends that you continue on your current medications as directed. Please refer to the Current Medication list given to you today.  *If you need a refill on your cardiac medications before your next appointment, please call your pharmacy*   Lab Work: Your physician recommends that you have a BMET and LFTs.  If you have labs (blood work) drawn today and your tests are completely normal, you will receive your results only by: [place] (if you have MyChart) OR A paper copy in the mail If you have any lab test that is abnormal or we need to change your treatment, we will call you to review the results.   Testing/Procedures: None ordered   Follow-Up: At [facility], you and your health needs are our priority.  As part of our continuing mission to provide you with exceptional heart care, we have created designated Provider Care Teams.  These Care Teams include your primary Cardiologist (physician) and Advanced Practice Providers (APPs -  Physician Assistants and Nurse Practitioners) who all work together to provide you with the care you need, when you need it.  We recommend signing up for the patient portal called "MyChart".  Sign up information is provided on this After Visit Summary.  MyChart is used to connect with patients for Virtual Visits (Telemedicine).  Patients are able to view lab/test results, encounter notes, upcoming appointments, etc.  Non-urgent messages can be sent to your provider as well.   To learn more about what you can do with MyChart, go to [email or web address].    Your next appointment:   4 month(s)  The format for your next appointment:   In Person  Provider:   Jyl Heinz, MD   Other Instructions NA

## 2021-04-06 NOTE — Addendum Note (Signed)
Addended by: Truddie Hidden on: 04/06/2021 01:39 PM   Modules accepted: Orders

## 2021-04-07 ENCOUNTER — Telehealth: Payer: Self-pay | Admitting: Cardiology

## 2021-04-07 NOTE — Telephone Encounter (Signed)
Pt is calling regarding lab results

## 2021-04-07 NOTE — Telephone Encounter (Signed)
Message left for pt to call back.

## 2021-04-08 NOTE — Telephone Encounter (Signed)
Pt aware that she needs additional labs done and that the amiodarone level takes 5-7 days to result.

## 2021-04-08 NOTE — Telephone Encounter (Signed)
Pt is returning call.  

## 2021-04-11 LAB — HEPATIC FUNCTION PANEL
ALT: 33 IU/L — ABNORMAL HIGH (ref 0–32)
AST: 27 IU/L (ref 0–40)
Albumin: 4.6 g/dL (ref 3.7–4.7)
Alkaline Phosphatase: 90 IU/L (ref 44–121)
Bilirubin Total: 0.5 mg/dL (ref 0.0–1.2)
Bilirubin, Direct: 0.19 mg/dL (ref 0.00–0.40)
Total Protein: 6.7 g/dL (ref 6.0–8.5)

## 2021-04-11 LAB — BASIC METABOLIC PANEL
BUN/Creatinine Ratio: 11 — ABNORMAL LOW (ref 12–28)
BUN: 17 mg/dL (ref 8–27)
CO2: 24 mmol/L (ref 20–29)
Calcium: 9.9 mg/dL (ref 8.7–10.3)
Chloride: 99 mmol/L (ref 96–106)
Creatinine, Ser: 1.48 mg/dL — ABNORMAL HIGH (ref 0.57–1.00)
Glucose: 148 mg/dL — ABNORMAL HIGH (ref 70–99)
Potassium: 5 mmol/L (ref 3.5–5.2)
Sodium: 139 mmol/L (ref 134–144)
eGFR: 38 mL/min/{1.73_m2} — ABNORMAL LOW (ref >59)

## 2021-04-14 ENCOUNTER — Telehealth: Payer: Self-pay

## 2021-04-14 NOTE — Telephone Encounter (Signed)
-----   Message from Jenean Lindau, MD sent at 04/12/2021  6:53 PM EST ----- Continue current medications.  Please send copy to Dr. Lyda Jester and inform patient.  Also copy to primary care. Jenean Lindau, MD 04/12/2021 6:52 PM

## 2021-04-14 NOTE — Telephone Encounter (Signed)
Patient notified of the following per Dr. Geraldo Pitter. Results sent to both provider as specified.

## 2021-04-16 LAB — AMIODARONE (CORDARONE), S/P
AMIODARONE: 555 ng/mL — ABNORMAL LOW (ref 1000–2500)
DESETHYLAMIODARONE: 539 ng/mL

## 2021-04-21 ENCOUNTER — Other Ambulatory Visit: Payer: Self-pay | Admitting: Cardiology

## 2021-04-21 ENCOUNTER — Telehealth: Payer: Self-pay | Admitting: Cardiology

## 2021-04-21 MED ORDER — APIXABAN 5 MG PO TABS
5.0000 mg | ORAL_TABLET | Freq: Two times a day (BID) | ORAL | 3 refills | Status: DC
Start: 2021-04-21 — End: 2022-07-31

## 2021-04-21 NOTE — Telephone Encounter (Signed)
RX sent

## 2021-04-21 NOTE — Telephone Encounter (Signed)
°*  STAT* If patient is at the pharmacy, call can be transferred to refill team.   1. Which medications need to be refilled? (please list name of each medication and dose if known)  apixaban (ELIQUIS) 5 MG TABS tablet  2. Which pharmacy/location (including street and city if local pharmacy) is medication to be sent to? [place], [address]  3. Do they need a 30 day or 90 day supply? 90 day  Patient is completely out of medication.

## 2021-05-03 ENCOUNTER — Other Ambulatory Visit: Payer: Self-pay | Admitting: Cardiology

## 2021-05-03 ENCOUNTER — Ambulatory Visit (INDEPENDENT_AMBULATORY_CARE_PROVIDER_SITE_OTHER): Payer: Medicare Other

## 2021-05-03 DIAGNOSIS — I480 Paroxysmal atrial fibrillation: Secondary | ICD-10-CM

## 2021-05-03 LAB — CUP PACEART REMOTE DEVICE CHECK
Date Time Interrogation Session: 20230102231159
Implantable Pulse Generator Implant Date: 20220720

## 2021-05-07 ENCOUNTER — Emergency Department (HOSPITAL_COMMUNITY): Payer: Medicare Other

## 2021-05-07 ENCOUNTER — Other Ambulatory Visit: Payer: Self-pay

## 2021-05-07 ENCOUNTER — Encounter (HOSPITAL_COMMUNITY): Payer: Self-pay

## 2021-05-07 ENCOUNTER — Emergency Department (HOSPITAL_COMMUNITY)
Admission: EM | Admit: 2021-05-07 | Discharge: 2021-05-07 | Disposition: A | Discharge status: Home / Self Care | Payer: Medicare Other | Attending: Emergency Medicine | Admitting: Emergency Medicine

## 2021-05-07 DIAGNOSIS — Z20822 Contact with and (suspected) exposure to covid-19: Secondary | ICD-10-CM | POA: Diagnosis not present

## 2021-05-07 DIAGNOSIS — Z7901 Long term (current) use of anticoagulants: Secondary | ICD-10-CM | POA: Insufficient documentation

## 2021-05-07 DIAGNOSIS — S0101XA Laceration without foreign body of scalp, initial encounter: Secondary | ICD-10-CM | POA: Insufficient documentation

## 2021-05-07 DIAGNOSIS — Z7984 Long term (current) use of oral hypoglycemic drugs: Secondary | ICD-10-CM | POA: Insufficient documentation

## 2021-05-07 DIAGNOSIS — W01198A Fall on same level from slipping, tripping and stumbling with subsequent striking against other object, initial encounter: Secondary | ICD-10-CM | POA: Insufficient documentation

## 2021-05-07 DIAGNOSIS — I4891 Unspecified atrial fibrillation: Secondary | ICD-10-CM | POA: Diagnosis not present

## 2021-05-07 DIAGNOSIS — Z79899 Other long term (current) drug therapy: Secondary | ICD-10-CM | POA: Diagnosis not present

## 2021-05-07 DIAGNOSIS — W19XXXA Unspecified fall, initial encounter: Secondary | ICD-10-CM

## 2021-05-07 DIAGNOSIS — T1490XA Injury, unspecified, initial encounter: Secondary | ICD-10-CM

## 2021-05-07 DIAGNOSIS — S0990XA Unspecified injury of head, initial encounter: Secondary | ICD-10-CM | POA: Diagnosis present

## 2021-05-07 LAB — COMPREHENSIVE METABOLIC PANEL
ALT: 30 U/L (ref 0–44)
AST: 26 U/L (ref 15–41)
Albumin: 3.9 g/dL (ref 3.5–5.0)
Alkaline Phosphatase: 80 U/L (ref 38–126)
Anion gap: 16 — ABNORMAL HIGH (ref 5–15)
BUN: 29 mg/dL — ABNORMAL HIGH (ref 8–23)
CO2: 23 mmol/L (ref 22–32)
Calcium: 9.5 mg/dL (ref 8.9–10.3)
Chloride: 101 mmol/L (ref 98–111)
Creatinine, Ser: 1.35 mg/dL — ABNORMAL HIGH (ref 0.44–1.00)
GFR, Estimated: 42 mL/min — ABNORMAL LOW (ref >60)
Glucose, Bld: 179 mg/dL — ABNORMAL HIGH (ref 70–99)
Potassium: 4 mmol/L (ref 3.5–5.1)
Sodium: 140 mmol/L (ref 135–145)
Total Bilirubin: 0.7 mg/dL (ref 0.3–1.2)
Total Protein: 6.7 g/dL (ref 6.5–8.1)

## 2021-05-07 LAB — SAMPLE TO BLOOD BANK

## 2021-05-07 LAB — LACTIC ACID, PLASMA
Lactic Acid, Venous: 4 mmol/L (ref 0.5–1.9)
Lactic Acid, Venous: 1.2 mmol/L (ref 0.5–1.9)

## 2021-05-07 LAB — CBC
HCT: 38.5 % (ref 36.0–46.0)
Hemoglobin: 13.3 g/dL (ref 12.0–15.0)
MCH: 32.4 pg (ref 26.0–34.0)
MCHC: 34.5 g/dL (ref 30.0–36.0)
MCV: 93.7 fL (ref 80.0–100.0)
Platelets: 394 10*3/uL (ref 150–400)
RBC: 4.11 MIL/uL (ref 3.87–5.11)
RDW: 13.3 % (ref 11.5–15.5)
WBC: 13.6 10*3/uL — ABNORMAL HIGH (ref 4.0–10.5)
nRBC: 0 % (ref 0.0–0.2)

## 2021-05-07 LAB — I-STAT CHEM 8, ED
BUN: 30 mg/dL — ABNORMAL HIGH (ref 8–23)
Calcium, Ion: 1.09 mmol/L — ABNORMAL LOW (ref 1.15–1.40)
Chloride: 102 mmol/L (ref 98–111)
Creatinine, Ser: 1.3 mg/dL — ABNORMAL HIGH (ref 0.44–1.00)
Glucose, Bld: 171 mg/dL — ABNORMAL HIGH (ref 70–99)
HCT: 41 % (ref 36.0–46.0)
Hemoglobin: 13.9 g/dL (ref 12.0–15.0)
Potassium: 3.9 mmol/L (ref 3.5–5.1)
Sodium: 138 mmol/L (ref 135–145)
TCO2: 24 mmol/L (ref 22–32)

## 2021-05-07 LAB — PROTIME-INR
INR: 1.3 — ABNORMAL HIGH (ref 0.8–1.2)
Prothrombin Time: 16 seconds — ABNORMAL HIGH (ref 11.4–15.2)

## 2021-05-07 LAB — ETHANOL: Alcohol, Ethyl (B): <10 mg/dL (ref <10)

## 2021-05-07 LAB — RESP PANEL BY RT-PCR (FLU A&B, COVID) ARPGX2
Influenza A by PCR: NEGATIVE
Influenza B by PCR: NEGATIVE
SARS Coronavirus 2 by RT PCR: NEGATIVE

## 2021-05-07 MED ORDER — SODIUM CHLORIDE 0.9 % IV BOLUS
500.0000 mL | Freq: Once | INTRAVENOUS | Status: AC
  Administered 2021-05-07: 500 mL via INTRAVENOUS

## 2021-05-07 NOTE — ED Provider Notes (Signed)
[facility] EMERGENCY DEPARTMENT Provider Note   CSN: [id number] Arrival date & time: 05/07/21  1558     History  Chief Complaint  Patient presents with   Courtney Gardner is a 71 y.o. female.  Patient presents via EMS after a fall.  She does not recall falling, awoke with EMS providers above her.  Per report, patient was witnessed to fall down about 5 steps onto concrete striking the back of her head.  She sustained laceration to the posterior.  Currently patient denies pain anywhere other than the back of her head.  She denies neck pain specifically.  She denies chest pain or extremity pain as well. No weakness anywhere, no confusion, disorientation.  She states that she was well prior to today's event.  She does take anticoagulant for history of A. fib.      Home Medications Prior to Admission medications   Medication Sig Start Date End Date Taking? Authorizing Provider  amiodarone (PACERONE) 200 MG tablet Take 0.5 tablets (100 mg total) by mouth daily. 04/06/21  Yes Revankar, Reita Cliche, MD  amLODipine (NORVASC) 10 MG tablet Take 1 tablet by mouth once daily 12/13/20  Yes Revankar, Reita Cliche, MD  apixaban (ELIQUIS) 5 MG TABS tablet Take 1 tablet (5 mg total) by mouth 2 (two) times daily. 04/21/21  Yes Revankar, Reita Cliche, MD  atorvastatin (LIPITOR) 80 MG tablet Take 80 mg by mouth daily. 02/06/20  Yes [provider]  ezetimibe (ZETIA) 10 MG tablet Take 10 mg by mouth daily.   Yes [provider]  hydrALAZINE (APRESOLINE) 50 MG tablet Take 1 tablet (50 mg total) by mouth every 8 (eight) hours. 05/03/21  Yes Revankar, Reita Cliche, MD  levothyroxine (SYNTHROID, LEVOTHROID) 50 MCG tablet Take 50 mcg by mouth daily before breakfast.  11/12/17  Yes [provider]  lisinopril (ZESTRIL) 20 MG tablet Take 20 mg by mouth daily. 03/25/21  Yes [provider]  metFORMIN (GLUCOPHAGE) 500 MG tablet Take 500 mg by mouth daily with breakfast.  07/21/19   Yes [provider]  nitroGLYCERIN (NITROSTAT) 0.4 MG SL tablet Place 0.4 mg under the tongue every 5 (five) minutes as needed for chest pain.   Yes [provider]  Nutritional Supplements (BOOST HIGH PROTEIN PO) Take 1 Can by mouth in the morning and at bedtime.   Yes [provider]  omeprazole (PRILOSEC) 40 MG capsule Take 1 capsule by mouth once daily 05/03/21  Yes Revankar, Reita Cliche, MD  PARoxetine (PAXIL) 10 MG tablet Take 10 mg by mouth daily. 06/29/19  Yes [provider]      Allergies    Patient has no known allergies.    Review of Systems   Review of Systems  Constitutional:        Per HPI, otherwise negative  HENT:         Per HPI, otherwise negative  Respiratory:         Per HPI, otherwise negative  Cardiovascular:        Per HPI, otherwise negative  Gastrointestinal:  Negative for vomiting.  Endocrine:       Negative aside from HPI  Genitourinary:        Neg aside from HPI   Musculoskeletal:        Per HPI, otherwise negative  Skin:  Positive for wound.  Neurological:  Negative for syncope.   Physical Exam Updated Vital Signs BP 97/61    Pulse  64    Temp (!) 97.2 F (36.2 C) (Oral)    Resp (!) 23    Ht 5\' 6"  (1.676 m)    Wt 64 kg    SpO2 100%    BMI 22.77 kg/m  Physical Exam Vitals and nursing note reviewed.  Constitutional:      General: She is not in acute distress.    Appearance: She is well-developed.  HENT:     Head: Normocephalic.     Comments: Open wound occipital area requires evaluation Eyes:     Conjunctiva/sclera: Conjunctivae normal.  Cardiovascular:     Rate and Rhythm: Normal rate and regular rhythm.  Pulmonary:     Effort: Pulmonary effort is normal. No respiratory distress.     Breath sounds: Normal breath sounds. No stridor.  Abdominal:     General: There is no distension.  Skin:    General: Skin is warm and dry.  Neurological:     Mental Status: She is alert and oriented to person, place, and  time.     Cranial Nerves: No cranial nerve deficit, dysarthria or facial asymmetry.     Motor: Atrophy present. No weakness, tremor or abnormal muscle tone.    ED Results / Procedures / Treatments   Labs (all labs ordered are listed, but only abnormal results are displayed) Labs Reviewed  COMPREHENSIVE METABOLIC PANEL - Abnormal; Notable for the following components:      Result Value   Glucose, Bld 179 (*)    BUN 29 (*)    Creatinine, Ser 1.35 (*)    GFR, Estimated 42 (*)    Anion gap 16 (*)    All other components within normal limits  CBC - Abnormal; Notable for the following components:   WBC 13.6 (*)    All other components within normal limits  LACTIC ACID, PLASMA - Abnormal; Notable for the following components:   Lactic Acid, Venous 4.0 (*)    All other components within normal limits  PROTIME-INR - Abnormal; Notable for the following components:   Prothrombin Time 16.0 (*)    INR 1.3 (*)    All other components within normal limits  I-STAT CHEM 8, ED - Abnormal; Notable for the following components:   BUN 30 (*)    Creatinine, Ser 1.30 (*)    Glucose, Bld 171 (*)    Calcium, Ion 1.09 (*)    All other components within normal limits  RESP PANEL BY RT-PCR (FLU A&B, COVID) ARPGX2  ETHANOL  URINALYSIS, ROUTINE W REFLEX MICROSCOPIC  LACTIC ACID, PLASMA  SAMPLE TO BLOOD BANK    EKG None  Radiology CT HEAD WO CONTRAST  Result Date: 05/07/2021 CLINICAL DATA:  Patient fell and hit the back of her head. EXAM: CT HEAD WITHOUT CONTRAST CT CERVICAL SPINE WITHOUT CONTRAST TECHNIQUE: Multidetector CT imaging of the head and cervical spine was performed following the standard protocol without intravenous contrast. Multiplanar CT image reconstructions of the cervical spine were also generated. COMPARISON:  11/07/2020. FINDINGS: CT HEAD FINDINGS Brain: No evidence of acute infarction, hemorrhage, hydrocephalus, extra-axial collection or mass lesion/mass effect. Focus of  hypoattenuation in the left anterior basal ganglia and overlying deep white matter consistent with an old infarct, stable. Patchy areas of periventricular white matter hypoattenuation are also noted consistent with mild chronic microvascular ischemic change. Vascular: No hyperdense vessel or unexpected calcification. Skull: Normal. Negative for fracture or focal lesion. Sinuses/Orbits: Globes and orbits are unremarkable. Sinuses are clear. Other: Right posterior parietal scalp hematoma.  CT CERVICAL SPINE FINDINGS Alignment: Normal. Skull base and vertebrae: No acute fracture. No primary bone lesion or focal pathologic process. Soft tissues and spinal canal: No prevertebral fluid or swelling. No visible canal hematoma. Disc levels: Mild loss of disc height at C4-C5. Remaining discs are well preserved in height. No disc bulging or evidence of a disc herniation. There are facet degenerative changes most evident on the left at C3-C4. Upper chest: Negative. Other: None. IMPRESSION: HEAD CT 1. No acute intracranial abnormalities. 2. No skull fracture. 3. Right posterior parietal scalp hematoma. CERVICAL CT 1. No fracture or acute finding Electronically Signed   By: Lajean Manes M.D.   On: 05/07/2021 17:03   CT CERVICAL SPINE WO CONTRAST  Result Date: 05/07/2021 CLINICAL DATA:  Patient fell and hit the back of her head. EXAM: CT HEAD WITHOUT CONTRAST CT CERVICAL SPINE WITHOUT CONTRAST TECHNIQUE: Multidetector CT imaging of the head and cervical spine was performed following the standard protocol without intravenous contrast. Multiplanar CT image reconstructions of the cervical spine were also generated. COMPARISON:  11/07/2020. FINDINGS: CT HEAD FINDINGS Brain: No evidence of acute infarction, hemorrhage, hydrocephalus, extra-axial collection or mass lesion/mass effect. Focus of hypoattenuation in the left anterior basal ganglia and overlying deep white matter consistent with an old infarct, stable. Patchy areas of  periventricular white matter hypoattenuation are also noted consistent with mild chronic microvascular ischemic change. Vascular: No hyperdense vessel or unexpected calcification. Skull: Normal. Negative for fracture or focal lesion. Sinuses/Orbits: Globes and orbits are unremarkable. Sinuses are clear. Other: Right posterior parietal scalp hematoma. CT CERVICAL SPINE FINDINGS Alignment: Normal. Skull base and vertebrae: No acute fracture. No primary bone lesion or focal pathologic process. Soft tissues and spinal canal: No prevertebral fluid or swelling. No visible canal hematoma. Disc levels: Mild loss of disc height at C4-C5. Remaining discs are well preserved in height. No disc bulging or evidence of a disc herniation. There are facet degenerative changes most evident on the left at C3-C4. Upper chest: Negative. Other: None. IMPRESSION: HEAD CT 1. No acute intracranial abnormalities. 2. No skull fracture. 3. Right posterior parietal scalp hematoma. CERVICAL CT 1. No fracture or acute finding Electronically Signed   By: Lajean Manes M.D.   On: 05/07/2021 17:03   DG Pelvis Portable  Result Date: 05/07/2021 CLINICAL DATA:  Status post fall. EXAM: PORTABLE PELVIS 1-2 VIEWS COMPARISON:  CT AP 05/06/2019 FINDINGS: No signs of acute fracture or dislocation. Mild bilateral hip osteoarthritis. Unchanged appearance of benign bone island within the left iliac bone measuring 1 cm. IMPRESSION: 1. No acute findings. 2. Mild bilateral hip osteoarthritis. Electronically Signed   By: Kerby Moors M.D.   On: 05/07/2021 16:35   DG Chest Port 1 View  Result Date: 05/07/2021 CLINICAL DATA:  Trauma.  Status post fall. EXAM: PORTABLE CHEST 1 VIEW COMPARISON:  02/19/20 FINDINGS: Previous median sternotomy and CABG procedure. Loop recorder is identified in the projection of the left heart border. No pleural effusion or interstitial edema identified. No airspace opacities identified. The visualized osseous structures are  unremarkable. IMPRESSION: No acute cardiopulmonary abnormalities. Electronically Signed   By: Kerby Moors M.D.   On: 05/07/2021 16:28    Procedures Procedures   LACERATION REPAIR Performed by: Carmin Muskrat Authorized by: Carmin Muskrat Consent: Verbal consent obtained. Risks and benefits: risks, benefits and alternatives were discussed Consent given by: patient Patient identity confirmed: provided demographic data Prepped and Draped in normal sterile fashion Wound explored  Laceration Location: scalp  Laceration Length:  6cm  No Foreign Bodies seen or palpated   Irrigation method: syringe Amount of cleaning: standard  Skin closure: staples  Number of staples :4  Technique: close  Patient tolerance: Patient tolerated the procedure well with no immediate complications.  Medications Ordered in ED Medications  sodium chloride 0.9 % bolus 500 mL (0 mLs Intravenous Stopped 05/07/21 1839)    ED Course/ Medical Decision Making/ A&P  7:29 PM Patient awake and alert, has tolerated laceration repair without complication.  She is aware of all findings and we discussed appropriate wounds with her friend after obtaining consent.                         Medical Decision Making Female presents as a level 2 trauma after a fall.  Patient is on anticoagulant and broad differential including fracture, intracranial hemorrhage, cervical spine derangement posttraumatic in preevent such as syncope, infection all considered.  Patient had labs, continuous monitoring. CT scans reviewed, interpreted, negative for intracranial hemorrhage, negative for fracture.  Labs similarly reassuring, slight elevation in creatinine, though not far from baseline.  Patient did receive fluid resuscitation here for mild dehydration.  Patient had monitoring for hours Cardiac 60 sinus normal Pulse ox 100% room air normal After no decompensation, with fluid resuscitation, decreased lactic acid value which was  likely secondary to her trauma, she was discharged in stable condition.        Final Clinical Impression(s) / ED Diagnoses Final diagnoses:  Trauma  Fall, initial encounter  Injury of head, initial encounter     Carmin Muskrat, MD 05/07/21 2006

## 2021-05-07 NOTE — ED Notes (Signed)
C-Collar removed by Vanita Panda, MD

## 2021-05-07 NOTE — Progress Notes (Signed)
Orthopedic Tech Progress Note Patient Details:  Courtney Gardner October 02, 1950 [id number]  Level 2 trauma  Patient ID: Courtney Gardner, female   DOB: 02-Jan-1951, 71 y.o.   MRN: [id number]  Courtney Gardner 05/07/2021, 5:27 PM

## 2021-05-07 NOTE — ED Notes (Signed)
Patient transported to CT, escorted by this RN.

## 2021-05-07 NOTE — ED Notes (Signed)
E-signature pad unavailable at time of pt discharge. This RN discussed discharge materials with pt & pt's visitor and answered all pt questions. Pt stated understanding of discharge material.

## 2021-05-07 NOTE — ED Notes (Signed)
Pt returned from CT with this RN.  

## 2021-05-07 NOTE — Discharge Instructions (Addendum)
As discussed, your evaluation today has been largely reassuring.  But, it is important that you monitor your condition carefully, and do not hesitate to return to the ED if you develop new, or concerning changes in your condition.  Otherwise, please follow-up with your physician for appropriate ongoing care.  Need to have your staples taken out in 1 week.

## 2021-05-07 NOTE — ED Notes (Signed)
Suture cart at bedside 

## 2021-05-07 NOTE — ED Triage Notes (Signed)
LEVEL 2 TRAUMA  Pt from home BIB EMS for an unwitnessed fall. Pt fell down about 5 steps and landed on a concrete pad. Hit head. + thinner. Unknown if pt LOC; pt doesn't remember falling. 2" laceration present on posterior head. Bleeding controlled at this time. C-collar in place. Pt A/Ox4.

## 2021-05-07 NOTE — ED Notes (Signed)
Wound cleansed with sterile water.

## 2021-05-12 NOTE — Progress Notes (Signed)
[facility] Summary Report / Loop Recorder 

## 2021-05-16 ENCOUNTER — Encounter (HOSPITAL_COMMUNITY): Payer: Self-pay | Admitting: Emergency Medicine

## 2021-05-16 ENCOUNTER — Emergency Department (HOSPITAL_COMMUNITY)
Admission: EM | Admit: 2021-05-16 | Discharge: 2021-05-16 | Disposition: A | Discharge status: Home / Self Care | Payer: Medicare Other | Attending: Emergency Medicine | Admitting: Emergency Medicine

## 2021-05-16 DIAGNOSIS — Z7901 Long term (current) use of anticoagulants: Secondary | ICD-10-CM | POA: Insufficient documentation

## 2021-05-16 DIAGNOSIS — Z4802 Encounter for removal of sutures: Secondary | ICD-10-CM | POA: Diagnosis present

## 2021-05-16 NOTE — ED Provider Notes (Signed)
[facility] EMERGENCY DEPARTMENT Provider Note   CSN: [id number] Arrival date & time: 05/16/21  1008     History  Chief Complaint  Patient presents with   Suture / Staple Removal    Courtney Gardner is a 71 y.o. female who presents emergency department for staple removal.  Patient has 4 staples in place from his scalp leave consideration placed 1 week ago.  No discharge or pain at the site.   Suture / Staple Removal      Home Medications Prior to Admission medications   Medication Sig Start Date End Date Taking? Authorizing Provider  amiodarone (PACERONE) 200 MG tablet Take 0.5 tablets (100 mg total) by mouth daily. 04/06/21   Revankar, Reita Cliche, MD  amLODipine (NORVASC) 10 MG tablet Take 1 tablet by mouth once daily 12/13/20   Revankar, Reita Cliche, MD  apixaban (ELIQUIS) 5 MG TABS tablet Take 1 tablet (5 mg total) by mouth 2 (two) times daily. 04/21/21   Revankar, Reita Cliche, MD  atorvastatin (LIPITOR) 80 MG tablet Take 80 mg by mouth daily. 02/06/20   [provider]  ezetimibe (ZETIA) 10 MG tablet Take 10 mg by mouth daily.    [provider]  hydrALAZINE (APRESOLINE) 50 MG tablet Take 1 tablet (50 mg total) by mouth every 8 (eight) hours. 05/03/21   Revankar, Reita Cliche, MD  levothyroxine (SYNTHROID, LEVOTHROID) 50 MCG tablet Take 50 mcg by mouth daily before breakfast.  11/12/17   [provider]  lisinopril (ZESTRIL) 20 MG tablet Take 20 mg by mouth daily. 03/25/21   [provider]  metFORMIN (GLUCOPHAGE) 500 MG tablet Take 500 mg by mouth daily with breakfast.  07/21/19   [provider]  nitroGLYCERIN (NITROSTAT) 0.4 MG SL tablet Place 0.4 mg under the tongue every 5 (five) minutes as needed for chest pain.    [provider]  Nutritional Supplements (BOOST HIGH PROTEIN PO) Take 1 Can by mouth in the morning and at bedtime.    [provider]  omeprazole (PRILOSEC) 40 MG capsule Take 1 capsule by mouth once  daily 05/03/21   Revankar, Reita Cliche, MD  PARoxetine (PAXIL) 10 MG tablet Take 10 mg by mouth daily. 06/29/19   [provider]      Allergies    Patient has no known allergies.    Review of Systems   Review of Systems As per HPI Physical Exam Updated Vital Signs BP 123/62 (BP Location: Left Arm)    Pulse 75    Temp 97.9 F (36.6 C) (Oral)    Resp 18    SpO2 100%  Physical Exam Vitals and nursing note reviewed.  Constitutional:      General: She is not in acute distress.    Appearance: She is well-developed. She is not diaphoretic.  HENT:     Head: Normocephalic.     Comments: Posterior scalp laceration well-healing with 4 staples in place    Right Ear: External ear normal.     Left Ear: External ear normal.     Nose: Nose normal.     Mouth/Throat:     Mouth: Mucous membranes are moist.  Eyes:     General: No scleral icterus.    Conjunctiva/sclera: Conjunctivae normal.  Cardiovascular:     Rate and Rhythm: Normal rate and regular rhythm.     Heart sounds: Normal heart sounds. No murmur heard.   No friction rub. No gallop.  Pulmonary:     Effort:  Pulmonary effort is normal. No respiratory distress.     Breath sounds: Normal breath sounds.  Abdominal:     General: Bowel sounds are normal. There is no distension.     Palpations: Abdomen is soft. There is no mass.     Tenderness: There is no abdominal tenderness. There is no guarding.  Musculoskeletal:     Cervical back: Normal range of motion.  Skin:    General: Skin is warm and dry.  Neurological:     Mental Status: She is alert and oriented to person, place, and time.  Psychiatric:        Behavior: Behavior normal.    ED Results / Procedures / Treatments   Labs (all labs ordered are listed, but only abnormal results are displayed) Labs Reviewed - No data to display  EKG None  Radiology No results found.  Procedures .Suture Removal  Date/Time: 05/16/2021 10:43 AM Performed by: Margarita Mail,  PA-C Authorized by: Margarita Mail, PA-C   Consent:    Consent obtained:  Verbal   Consent given by:  Patient   Risks discussed:  Bleeding, pain and wound separation   Alternatives discussed:  No treatment Universal protocol:    Patient identity confirmed:  Verbally with patient Location:    Location:  [place] location:  Scalp Procedure details:    Wound appearance:  No signs of infection, good wound healing and clean   Number of staples removed:  4 Post-procedure details:    Post-removal:  No dressing applied   Procedure completion:  Tolerated well, no immediate complications   Medications Ordered in ED Medications - No data to display  ED Course/ Medical Decision Making/ A&P                           Medical Decision Making  Staple removal   Pt to ER for staple/suture removal and wound check as above. Procedure tolerated well. Vitals normal, no signs of infection. Scar minimization & return precautions given at dc.   Final Clinical Impression(s) / ED Diagnoses Final diagnoses:  None    Rx / DC Orders ED Discharge Orders     None         Margarita Mail, PA-C 05/16/21 1045    Daleen Bo, MD 05/16/21 [phone]

## 2021-05-16 NOTE — Discharge Instructions (Signed)
Get help right away if: You have a fever or chills. You have red streaks coming from your wound.

## 2021-05-16 NOTE — ED Triage Notes (Signed)
Patient here with request for suture removal. Patient alert, oriented, and in no apparent distress at this time.

## 2021-06-06 ENCOUNTER — Ambulatory Visit (INDEPENDENT_AMBULATORY_CARE_PROVIDER_SITE_OTHER): Payer: Medicare Other

## 2021-06-06 DIAGNOSIS — R55 Syncope and collapse: Secondary | ICD-10-CM

## 2021-06-07 LAB — CUP PACEART REMOTE DEVICE CHECK
Date Time Interrogation Session: 20230205231238
Implantable Pulse Generator Implant Date: 20220720

## 2021-06-09 NOTE — Progress Notes (Signed)
[facility] Summary Report / Loop Recorder 

## 2021-07-04 DIAGNOSIS — M810 Age-related osteoporosis without current pathological fracture: Secondary | ICD-10-CM

## 2021-07-04 HISTORY — DX: Age-related osteoporosis without current pathological fracture: M81.0

## 2021-07-11 ENCOUNTER — Ambulatory Visit (INDEPENDENT_AMBULATORY_CARE_PROVIDER_SITE_OTHER): Payer: Medicare Other

## 2021-07-11 DIAGNOSIS — R55 Syncope and collapse: Secondary | ICD-10-CM | POA: Diagnosis not present

## 2021-07-12 LAB — CUP PACEART REMOTE DEVICE CHECK
Date Time Interrogation Session: 20230312231327
Implantable Pulse Generator Implant Date: 20220720

## 2021-07-26 NOTE — Progress Notes (Signed)
[facility] Summary Report / Loop Recorder 

## 2021-07-27 ENCOUNTER — Encounter: Payer: Medicare Other | Admitting: Internal Medicine

## 2021-08-12 ENCOUNTER — Ambulatory Visit: Payer: Medicare Other | Admitting: Cardiology

## 2021-08-15 ENCOUNTER — Ambulatory Visit (INDEPENDENT_AMBULATORY_CARE_PROVIDER_SITE_OTHER): Payer: Medicare Other

## 2021-08-15 DIAGNOSIS — R55 Syncope and collapse: Secondary | ICD-10-CM

## 2021-08-17 LAB — CUP PACEART REMOTE DEVICE CHECK
Date Time Interrogation Session: 20230414230907
Implantable Pulse Generator Implant Date: 20220720

## 2021-08-28 DIAGNOSIS — Z9889 Other specified postprocedural states: Secondary | ICD-10-CM

## 2021-08-28 HISTORY — DX: Other specified postprocedural states: Z98.890

## 2021-08-29 ENCOUNTER — Encounter: Payer: Self-pay | Admitting: Internal Medicine

## 2021-08-29 ENCOUNTER — Ambulatory Visit (INDEPENDENT_AMBULATORY_CARE_PROVIDER_SITE_OTHER): Payer: Medicare Other | Admitting: Internal Medicine

## 2021-08-29 VITALS — BP 109/72 | HR 64 | Ht 66.0 in | Wt 136.4 lb

## 2021-08-29 DIAGNOSIS — Z9889 Other specified postprocedural states: Secondary | ICD-10-CM

## 2021-08-29 DIAGNOSIS — I480 Paroxysmal atrial fibrillation: Secondary | ICD-10-CM | POA: Diagnosis not present

## 2021-08-29 NOTE — Patient Instructions (Addendum)
Medication Instructions:  ?Your physician recommends that you continue on your current medications as directed. Please refer to the Current Medication list given to you today. ? ?*If you need a refill on your cardiac medications before your next appointment, please call your pharmacy* ? ? ?Lab Work: ?TSH with Dr Candiss Norse ?If you have labs (blood work) drawn today and your tests are completely normal, you will receive your results only by: ?MyChart Message (if you have MyChart) OR ?A paper copy in the mail ?If you have any lab test that is abnormal or we need to change your treatment, we will call you to review the results. ? ? ?Testing/Procedures: ?None ordered. ? ? ? ?Follow-Up: ?At [facility], you and your health needs are our priority.  As part of our continuing mission to provide you with exceptional heart care, we have created designated Provider Care Teams.  These Care Teams include your primary Cardiologist (physician) and Advanced Practice Providers (APPs -  Physician Assistants and Nurse Practitioners) who all work together to provide you with the care you need, when you need it. ? ?We recommend signing up for the patient portal called "MyChart".  Sign up information is provided on this After Visit Summary.  MyChart is used to connect with patients for Virtual Visits (Telemedicine).  Patients are able to view lab/test results, encounter notes, upcoming appointments, etc.  Non-urgent messages can be sent to your provider as well.   ?To learn more about what you can do with MyChart, go to [email or web address].   ? ?Your next appointment:   ?6 months with Dr Olin Pia PA ?Oda Kilts, PA-C or Tommye Standard PA-C ? ?Important Information About Sugar ? ? ? ? ?  ?

## 2021-08-29 NOTE — Progress Notes (Signed)
Patient ID: Courtney Gardner, female   DOB: [birth year], 71 y.o.   MRN: [id number] ? ? ? ? ? ?Patient Care Team: ?Earna Coder, NP as PCP - General (Family Medicine) ?Revankar, Reita Cliche, MD as PCP - Cardiology (Cardiology) ?Deboraha Sprang, MD as PCP - Electrophysiology (Cardiology) ? ? ?HPI ?                       ?Courtney Gardner is a 71 y.o. female seen In follow-up for syncope gIn follow-up for syncope g for which she underwent loop recorder implantation. ?As well it was thought to be neurally mediated, it occurred in the context of coronary artery disease with prior bypass and normal LV function. ? ?History of atrial fibrillation flutter on amiodarone.  Also on anticoagulation as she had a pulmonary embolism ? ?The patient denies chest pain, shortness of breath, nocturnal dyspnea, orthopnea or peripheral edema.  There have been no palpitations, lightheadedness or syncope.   ? . ?She has a history of tachypalpitations and some bradycardia she is here today for loop recorder ? ?  ? ? ? ?Date   Cr              K Hgb TSH LFTs  ?10/21 1.32 4.1  12.0    ?7/22  1.41 3.8   13.2 1.71   ?1/23 1.30 3.9 13.9  30  ? ?DATE TEST EF%   ?07/19 Echo  60-65 %   ?7/19 LHC  55-65 %   ?4/21 MyoView 44%   ?5/21 Echo 50-55%   ?6/21 LHC 60-65%   ?6/12 Echo 60-65%   ?7/22 Echo 50-55%   ? ?Thromboembolic risk factors ( age -47, HTN-1, DM-1, Vasc disease -1, Gender-1) for a CHADSVASc Score of >=5 ? ? ?Past Medical History:  ?Diagnosis Date  ? Abdominal bruit 12/12/2019  ? AKI (acute kidney injury) ([place]) 10/17/2019  ? Angina pectoris ([place]) 11/20/2017  ? Barrett's esophagus 09/30/2019  ? Cancer [facility])   ? Coronary artery disease involving native coronary artery of native heart 12/26/2017  ? Diabetes mellitus due to underlying condition with unspecified complications ([place]) 62/37/6283  ? Type 2, on metformin  ? Dysrhythmia   ? Essential hypertension 11/20/2017  ? Ex-smoker 11/20/2017  ? Glaucoma 09/30/2019  ? Headache 11/10/2020  ? Hyperlipidemia  11/20/2017  ? Hyperlipidemia associated with type 2 diabetes mellitus ([place]) 10/2017  ? Hypertension associated with diabetes ([place]) 11/20/2017  ? Hypothyroidism 10/17/2019  ? Iliotibial band syndrome of left side 09/09/2015  ? Lacunar infarction ([place]) 02/19/2020  ? Palpitations 08/12/2019  ? Paroxysmal atrial fibrillation ([place]) 10/17/2019  ? Pleural effusion on left 10/17/2019  ? Pulmonary embolism ([place]) 11/10/2020  ? Renal calculi 09/30/2019  ? Skin cancer of arm, left   ? Syncope 02/19/2020  ? ? ?Past Surgical History:  ?Procedure Laterality Date  ? CHOLECYSTECTOMY    ? CORONARY ARTERY BYPASS GRAFT N/A 10/02/2019  ? Procedure: CORONARY ARTERY BYPASS GRAFTING (CABG) TIMES TWO, ON PUMP, USING LEFT INTERNAL MAMMARY ARTERY AND RIGHT GREATER SAPHENOUS VEIN HARVESTED ENDOSCOPICALLY;  Surgeon: [name], MD;  Location: [place];  Service: Open Heart Surgery;  Laterality: N/A;  LIMA to LAD, SVG to OM 1  ? INTRAVASCULAR PRESSURE WIRE/FFR STUDY N/A 11/23/2017  ? Procedure: INTRAVASCULAR PRESSURE WIRE/FFR STUDY;  Surgeon: Jettie Booze, MD;  Location: [place] [facility];  Service: Cardiovascular;  Laterality: N/A;  ? IR THORACENTESIS ASP PLEURAL SPACE W/IMG GUIDE  12/04/2019  ?  LEFT HEART CATH AND CORONARY ANGIOGRAPHY N/A 11/23/2017  ? Procedure: LEFT HEART CATH AND CORONARY ANGIOGRAPHY;  Surgeon: Jettie Booze, MD;  Location: [place] [facility];  Service: Cardiovascular;  Laterality: N/A;  ? LEFT HEART CATH AND CORONARY ANGIOGRAPHY N/A 09/30/2019  ? Procedure: LEFT HEART CATH AND CORONARY ANGIOGRAPHY;  Surgeon: Troy Sine, MD;  Location: [place] [facility];  Service: Cardiovascular;  Laterality: N/A;  ? MEDIASTINAL EXPLORATION N/A 10/03/2019  ? Procedure: MEDIASTINAL WASHOUT;  Surgeon: [name], MD;  Location: [place];  Service: Thoracic;  Laterality: N/A;  ? TEE WITHOUT CARDIOVERSION N/A 10/02/2019  ? Procedure: TRANSESOPHAGEAL ECHOCARDIOGRAM (TEE);  Surgeon: [name], MD;  Location: [place];  Service: Open Heart Surgery;  Laterality: N/A;  ? TUBAL LIGATION    ? ? ?Current Meds  ?Medication Sig  ? amiodarone (PACERONE) 200 MG tablet Take 0.5 tablets (100 mg total) by mouth daily.  ? amLODipine (NORVASC) 10 MG tablet Take 1 tablet by mouth once daily  ? apixaban (ELIQUIS) 5 MG TABS tablet Take 1 tablet (5 mg total) by mouth 2 (two) times daily.  ? atorvastatin (LIPITOR) 80 MG tablet Take 80 mg by mouth daily.  ? ezetimibe (ZETIA) 10 MG tablet Take 10 mg by mouth daily.  ? hydrALAZINE (APRESOLINE) 50 MG tablet Take 1 tablet (50 mg total) by mouth every 8 (eight) hours.  ? levothyroxine (SYNTHROID, LEVOTHROID) 50 MCG tablet Take 50 mcg by mouth daily before breakfast.   ? lisinopril (ZESTRIL) 20 MG tablet Take 20 mg by mouth daily.  ? metFORMIN (GLUCOPHAGE) 500 MG tablet Take 500 mg by mouth daily with breakfast.   ? nitroGLYCERIN (NITROSTAT) 0.4 MG SL tablet Place 0.4 mg under the tongue every 5 (five) minutes as needed for chest pain.  ? Nutritional Supplements (BOOST HIGH PROTEIN PO) Take 1 Can by mouth in the morning and at bedtime.  ? omeprazole (PRILOSEC) 40 MG capsule Take 1 capsule by mouth once daily  ? PARoxetine (PAXIL) 10 MG tablet Take 10 mg by mouth daily.  ? ? ?No Known Allergies ? ?Review of Systems negative except from HPI and PMH ? ?Physical Exam: ?BP (!) 142/70   Pulse 64   Ht '5\' 6"'$  (1.676 m)   Wt 136 lb 6.4 oz (61.9 kg)   SpO2 97%   BMI 22.02 kg/m?  ?Well developed and nourished in no acute distress ?HENT normal ?Neck supple with JVP-  flat   ?Clear ?Regular rate and rhythm, no murmurs or gallops ?Abd-soft with active BS ?No Clubbing cyanosis edema ?Skin-warm and dry ?A & Oriented  Grossly normal sensory and motor function ? ?ECG sinus 64 ?16/09/46  ? ? ?Assessment and  Plan: ?Atrial fibrillation/flutter ? ?Syncope ? ?High risk medication surveillance ? ?Coronary artery disease with prior bypass ? ?Hypothyroidism-treated ?  ?Tolerating amiodarone.  Clinically.  Needs a TSH.  We  will get it drawn. ?No interval syncope. ? ?Denies angina.  Continue anticoagulation with Eliquis.  This will be indefinitely given the pulmonary embolism with what was apparently undetermined source as well as atrial fibrillation and CHA2DS2-VASc or 5. ? ?Blood pressures well controlled.  Initial number was 142, on repeat it was 321 systolic.  We will continue the Apresoline to 50 every 8, amlodipine at 10 lisinopril 20. ? ? ? ?

## 2021-09-02 ENCOUNTER — Other Ambulatory Visit: Payer: Self-pay | Admitting: Internal Medicine

## 2021-09-02 NOTE — Progress Notes (Signed)
[facility] Summary Report / Loop Recorder 

## 2021-09-03 LAB — SPECIMEN STATUS REPORT

## 2021-09-05 LAB — TSH: TSH: 2.3 u[IU]/mL (ref 0.450–4.500)

## 2021-09-09 ENCOUNTER — Ambulatory Visit: Payer: Medicare Other | Admitting: Cardiology

## 2021-09-09 ENCOUNTER — Telehealth: Payer: Self-pay

## 2021-09-09 NOTE — Telephone Encounter (Signed)
Spoke with pt and advised per Dr Caryl Comes, labs are normal.  Pt verbalizes understanding and thanked RN for the phone call. ?

## 2021-09-09 NOTE — Telephone Encounter (Signed)
-----   Message from Deboraha Sprang, MD sent at 09/08/2021 10:04 AM EDT ----- ? Please inform patient that   labs are normal  ? ?

## 2021-09-18 LAB — CUP PACEART REMOTE DEVICE CHECK
Date Time Interrogation Session: 20230517231226
Implantable Pulse Generator Implant Date: 20220720

## 2021-09-19 ENCOUNTER — Ambulatory Visit (INDEPENDENT_AMBULATORY_CARE_PROVIDER_SITE_OTHER): Payer: Medicare Other

## 2021-09-19 DIAGNOSIS — I480 Paroxysmal atrial fibrillation: Secondary | ICD-10-CM

## 2021-09-20 ENCOUNTER — Other Ambulatory Visit: Payer: Self-pay | Admitting: Cardiology

## 2021-10-04 NOTE — Progress Notes (Signed)
[facility] Summary Report / Loop Recorder 

## 2021-10-08 IMAGING — MR MR MRA NECK WO/W CM
4 of 7 series · 16 of 48 positions shown · IV contrast (gadavist)
Comparison: Same day CT head.
COMPARISON: Same day CT head.

Addendum:
CLINICAL DATA: Neuro deficit, acute stroke suspected

EXAM:
MRI HEAD WITHOUT CONTRAST
MRA HEAD WITHOUT CONTRAST
MRA NECK WITHOUT AND WITH CONTRAST
TECHNIQUE: Multiplanar, multi-echo pulse sequences of the brain and surrounding
structures were acquired without intravenous contrast. Angiographic
images of the Circle of Willis were acquired using MRA technique
without intravenous contrast. Angiographic images of the neck were
acquired using MRA technique without and with intravenous contrast.
Carotid stenosis measurements (when applicable) are obtained
utilizing NASCET criteria, using the distal internal carotid
diameter as the denominator.
CONTRAST:  6.9mL GADAVIST GADOBUTROL 1 MMOL/ML IV SOLN

[Series 1600: cor cemra ft · coronal · 1.2mm · 0.59mm/px · 7 of 105 slices shown]
[im 1/105]
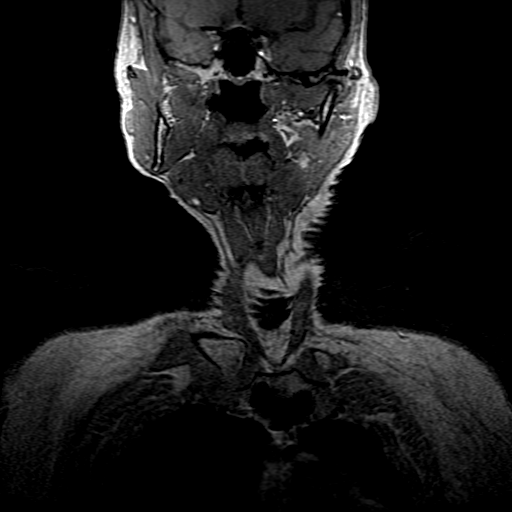
[im 18/105]
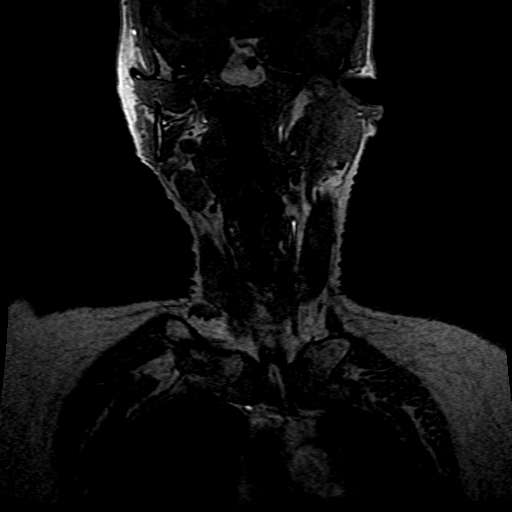
[im 35/105]
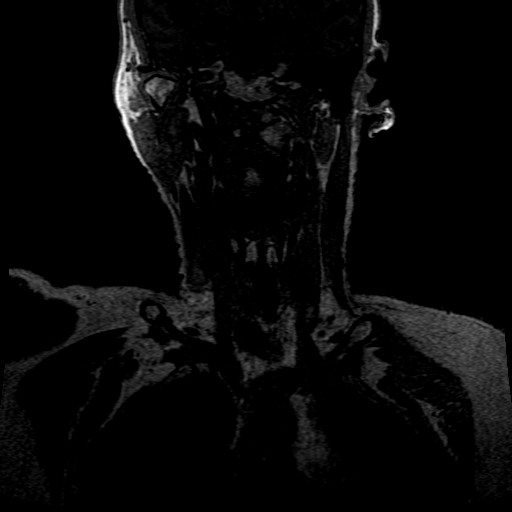
[im 53/105]
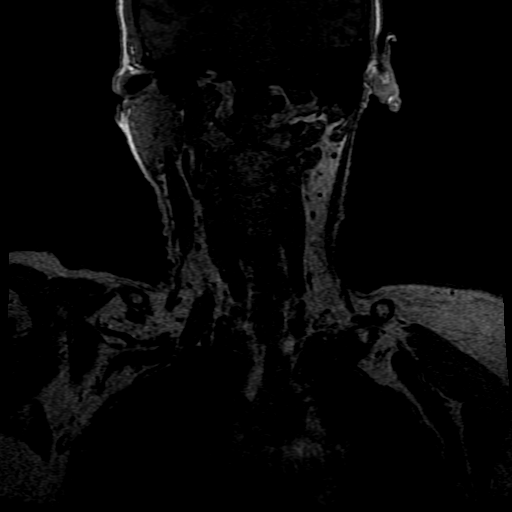
[im 70/105]
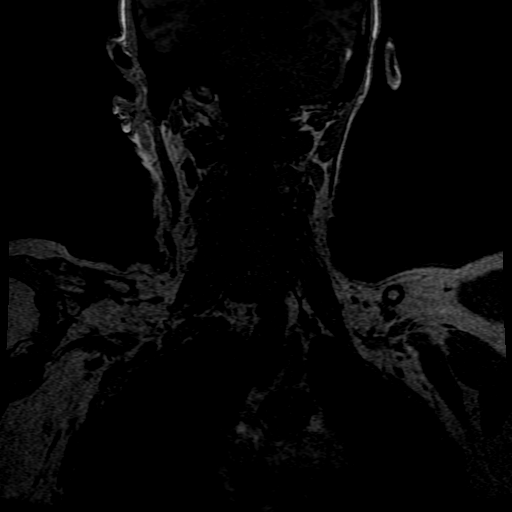
[im 87/105]
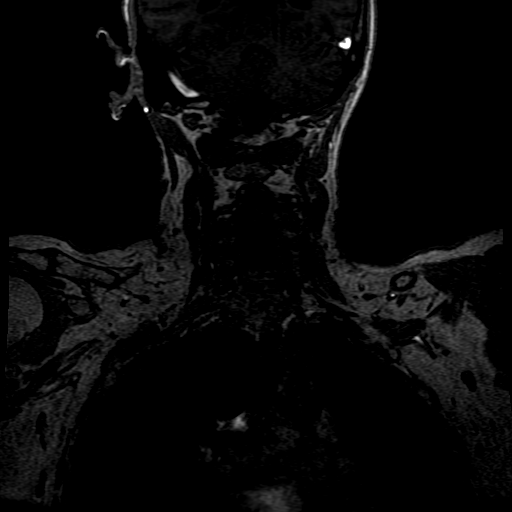
[im 105/105]
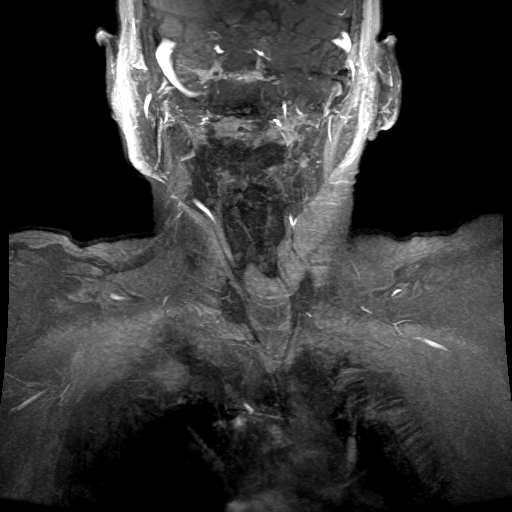

[Series 1601: ph1/cor cemra ft · coronal · 1.2mm · 0.59mm/px · 3 of 104 slices shown]
[im 18/104]
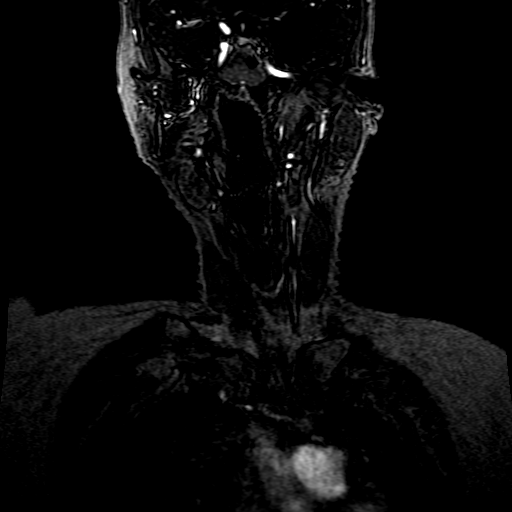
[im 52/104]
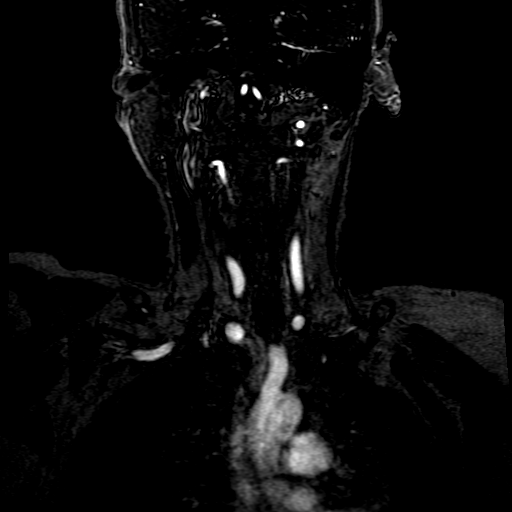
[im 86/104]
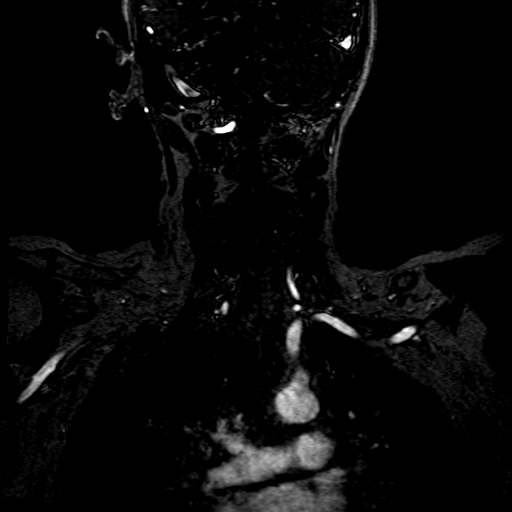

[Series 1602: ph2/cor cemra ft · coronal · 1.2mm · 0.59mm/px · 3 of 104 slices shown]
[im 18/104]
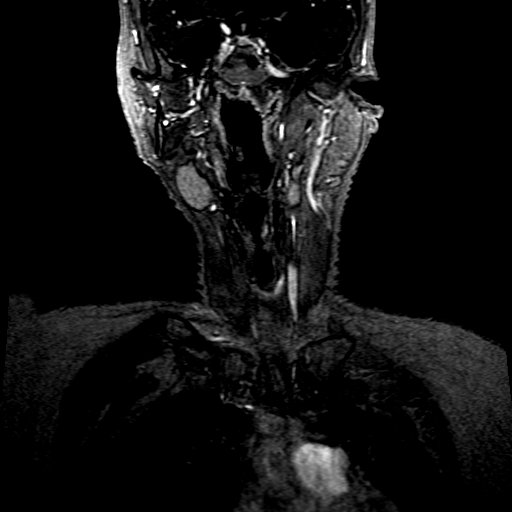
[im 52/104]
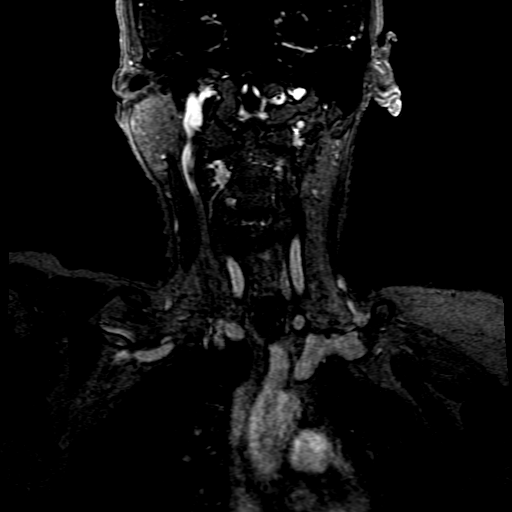
[im 86/104]
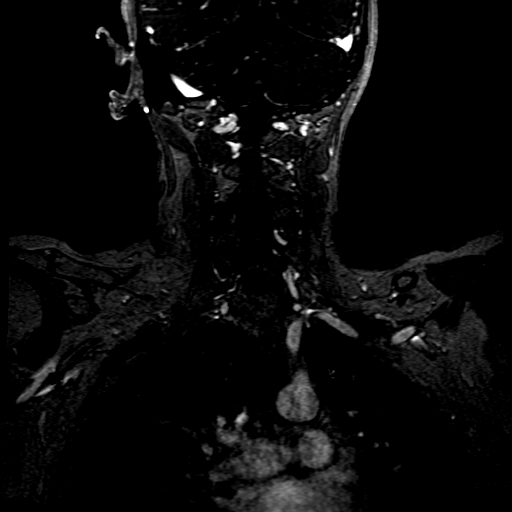

[((date))-((date)) · coronal · 1.2mm · 0.59mm/px · 3 of 105 slices shown]
[im 18/105]
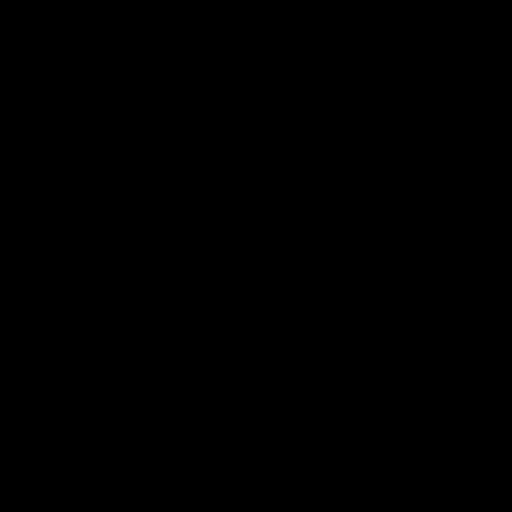
[im 53/105]
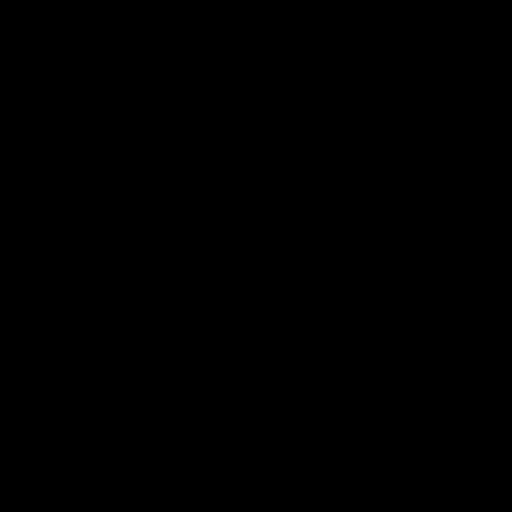
[im 87/105]
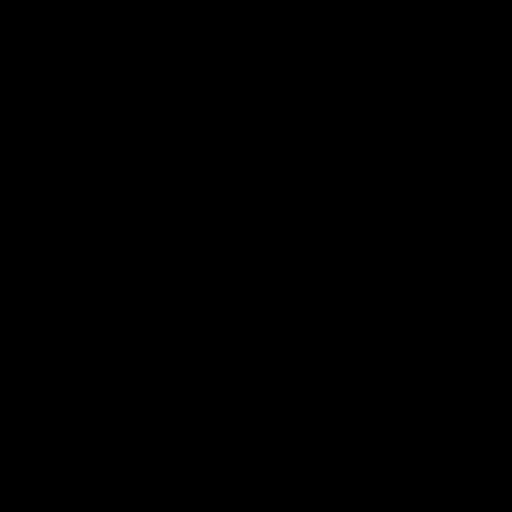

[16 of 48 positions shown; findings below may reference images not displayed]

FINDINGS: MRI HEAD FINDINGS

Brain:

Small left posterior parahippocampal focus of DWI hyperintensity
(series 3, image 25) without clear ADC correlate, potentially a
small acute/subacute infarct versus artifact. Remote infarct in the
left basal ganglia. Moderate scattered T2/FLAIR hyperintensities
within the white matter, likely related to chronic microvascular
ischemic disease. No hydrocephalus, acute hemorrhage, extra-axial
fluid collection, mass lesion, or midline shift.

Vascular: See below.

Skull and upper cervical spine: Normal marrow signal.

Sinuses/Orbits: No acute findings.

Other: No mastoid effusions.

MRA HEAD FINDINGS

Anterior circulation: Bilateral ICAs, MCAs, and ACAs are patent
without proximal hemodynamically significant stenosis. No aneurysm
identified.

Posterior circulation: Bilateral intradural vertebral arteries are
patent. The basilar artery and bilateral posterior cerebral arteries
are patent without proximal hemodynamically significant stenosis.

MRA NECK FINDINGS

Aortic arch: Great vessel origins are patent. No significant
(greater than 50%) stenosis.

Right carotid system: No significant (greater than 50%) stenosis.

Left carotid system: There is atherosclerotic narrowing at the
carotid bifurcation without stenosis, although the degree of
stenosis does not appear to exceed 50% in comparison to the distal
vessel.

Vertebral arteries: Bilateral vertebral arteries are patent without
evidence of greater than 50% stenosis.
IMPRESSION: MRI:

1. Small left posterior parahippocampal focus of DWI hyperintensity
(series 3, image 25) without clear ADC correlate, potentially a
artifactual or a small acute/subacute infarct. No significant edema
or mass effect.
2. Remote left basal ganglia and left occipital infarcts.
3. Moderate chronic microvascular ischemic disease

MRA:

1. No large vessel occlusion or proximal hemodynamically significant
stenosis intracranially.
2. In the neck, there is atherosclerosis at the left carotid
bifurcation; however, the degree of stenosis does not appear to
exceed 50%.

ADDENDUM:
On further review, there is multifocal moderate stenosis of the
right P2 PCA.

This addendum was discussed with Dr. Satish via telephone at

*** End of Addendum ***
FINDINGS: MRI HEAD FINDINGS

Brain:

Small left posterior parahippocampal focus of DWI hyperintensity
(series 3, image 25) without clear ADC correlate, potentially a
small acute/subacute infarct versus artifact. Remote infarct in the
left basal ganglia. Moderate scattered T2/FLAIR hyperintensities
within the white matter, likely related to chronic microvascular
ischemic disease. No hydrocephalus, acute hemorrhage, extra-axial
fluid collection, mass lesion, or midline shift.

Vascular: See below.

Skull and upper cervical spine: Normal marrow signal.

Sinuses/Orbits: No acute findings.

Other: No mastoid effusions.

MRA HEAD FINDINGS

Anterior circulation: Bilateral ICAs, MCAs, and ACAs are patent
without proximal hemodynamically significant stenosis. No aneurysm
identified.

Posterior circulation: Bilateral intradural vertebral arteries are
patent. The basilar artery and bilateral posterior cerebral arteries
are patent without proximal hemodynamically significant stenosis.

MRA NECK FINDINGS

Aortic arch: Great vessel origins are patent. No significant
(greater than 50%) stenosis.

Right carotid system: No significant (greater than 50%) stenosis.

Left carotid system: There is atherosclerotic narrowing at the
carotid bifurcation without stenosis, although the degree of
stenosis does not appear to exceed 50% in comparison to the distal
vessel.

Vertebral arteries: Bilateral vertebral arteries are patent without
evidence of greater than 50% stenosis.
IMPRESSION: MRI:

1. Small left posterior parahippocampal focus of DWI hyperintensity
(series 3, image 25) without clear ADC correlate, potentially a
artifactual or a small acute/subacute infarct. No significant edema
or mass effect.
2. Remote left basal ganglia and left occipital infarcts.
3. Moderate chronic microvascular ischemic disease

MRA:

1. No large vessel occlusion or proximal hemodynamically significant
stenosis intracranially.
2. In the neck, there is atherosclerosis at the left carotid
bifurcation; however, the degree of stenosis does not appear to
exceed 50%.

## 2021-10-24 ENCOUNTER — Ambulatory Visit (INDEPENDENT_AMBULATORY_CARE_PROVIDER_SITE_OTHER): Payer: Medicare Other

## 2021-10-24 DIAGNOSIS — I480 Paroxysmal atrial fibrillation: Secondary | ICD-10-CM

## 2021-10-26 LAB — CUP PACEART REMOTE DEVICE CHECK
Date Time Interrogation Session: 20230619231154
Implantable Pulse Generator Implant Date: 20220720

## 2021-11-17 NOTE — Progress Notes (Signed)
[facility] Summary Report / Loop Recorder 

## 2021-11-24 LAB — CUP PACEART REMOTE DEVICE CHECK
Date Time Interrogation Session: 20230722231038
Implantable Pulse Generator Implant Date: 20220720

## 2021-11-28 ENCOUNTER — Ambulatory Visit (INDEPENDENT_AMBULATORY_CARE_PROVIDER_SITE_OTHER): Payer: Medicare Other

## 2021-11-28 DIAGNOSIS — R55 Syncope and collapse: Secondary | ICD-10-CM

## 2021-12-14 ENCOUNTER — Telehealth: Payer: Self-pay | Admitting: Internal Medicine

## 2021-12-14 NOTE — Telephone Encounter (Signed)
The patient just needed the name of the app to put on her phone for monitoring. I gave her the number to [facility] support for additional help.

## 2021-12-14 NOTE — Telephone Encounter (Signed)
  1. Has your device fired? no  2. Is you device beeping? no  3. Are you experiencing draining or swelling at device site? no  4. Are you calling to see if we received your device transmission? no  5. Have you passed out? No  Patient states she is supposed to be sent a link to connect to her monitor.     Please route to [place]

## 2021-12-15 ENCOUNTER — Other Ambulatory Visit: Payer: Self-pay

## 2021-12-16 ENCOUNTER — Ambulatory Visit (INDEPENDENT_AMBULATORY_CARE_PROVIDER_SITE_OTHER): Payer: Medicare Other | Admitting: Cardiology

## 2021-12-16 ENCOUNTER — Encounter: Payer: Self-pay | Admitting: Cardiology

## 2021-12-16 VITALS — BP 134/60 | HR 62 | Ht 66.0 in | Wt 141.6 lb

## 2021-12-16 DIAGNOSIS — I10 Essential (primary) hypertension: Secondary | ICD-10-CM

## 2021-12-16 DIAGNOSIS — E782 Mixed hyperlipidemia: Secondary | ICD-10-CM

## 2021-12-16 DIAGNOSIS — I6529 Occlusion and stenosis of unspecified carotid artery: Secondary | ICD-10-CM

## 2021-12-16 DIAGNOSIS — I480 Paroxysmal atrial fibrillation: Secondary | ICD-10-CM | POA: Diagnosis not present

## 2021-12-16 DIAGNOSIS — I2510 Atherosclerotic heart disease of native coronary artery without angina pectoris: Secondary | ICD-10-CM

## 2021-12-16 DIAGNOSIS — E088 Diabetes mellitus due to underlying condition with unspecified complications: Secondary | ICD-10-CM

## 2021-12-16 DIAGNOSIS — R0989 Other specified symptoms and signs involving the circulatory and respiratory systems: Secondary | ICD-10-CM

## 2021-12-16 NOTE — Addendum Note (Signed)
Addended by: Truddie Hidden on: 12/16/2021 02:41 PM   Modules accepted: Orders

## 2021-12-16 NOTE — Patient Instructions (Signed)
Medication Instructions:  Your physician recommends that you continue on your current medications as directed. Please refer to the Current Medication list given to you today.  *If you need a refill on your cardiac medications before your next appointment, please call your pharmacy*   Lab Work: None ordered If you have labs (blood work) drawn today and your tests are completely normal, you will receive your results only by: [place] (if you have MyChart) OR A paper copy in the mail If you have any lab test that is abnormal or we need to change your treatment, we will call you to review the results.   Testing/Procedures: Your physician has requested that you have a carotid duplex. This test is an ultrasound of the carotid arteries in your neck. It looks at blood flow through these arteries that supply the brain with blood. Allow one hour for this exam. There are no restrictions or special instructions.    Follow-Up: At [facility], you and your health needs are our priority.  As part of our continuing mission to provide you with exceptional heart care, we have created designated Provider Care Teams.  These Care Teams include your primary Cardiologist (physician) and Advanced Practice Providers (APPs -  Physician Assistants and Nurse Practitioners) who all work together to provide you with the care you need, when you need it.  We recommend signing up for the patient portal called "MyChart".  Sign up information is provided on this After Visit Summary.  MyChart is used to connect with patients for Virtual Visits (Telemedicine).  Patients are able to view lab/test results, encounter notes, upcoming appointments, etc.  Non-urgent messages can be sent to your provider as well.   To learn more about what you can do with MyChart, go to [email or web address].    Your next appointment:   9 month(s)  The format for your next appointment:   In Person  Provider:   Jyl Heinz,  MD   Other Instructions NA

## 2021-12-16 NOTE — Progress Notes (Signed)
Cardiology Office Note:    Date:  12/16/2021   ID:  Courtney Gardner, DOB [birth year], MRN [id number]  PCP:  Courtney Coder, NP  Cardiologist:  Courtney Lindau, MD   Referring MD: Courtney Coder, NP    ASSESSMENT:    1. Coronary artery disease involving native coronary artery of native heart without angina pectoris   2. Essential hypertension   3. Mixed hyperlipidemia   4. Paroxysmal atrial fibrillation (HCC)   5. Diabetes mellitus due to underlying condition with unspecified complications ([place])   6. Carotid atherosclerosis, unspecified laterality    PLAN:    In order of problems listed above:  Coronary artery disease: Secondary prevention stressed with the patient.  Importance of compliance with diet medication stressed and she vocalized understanding.  She was advised to walk at least half an hour a day 5 days a week and she vocalized understanding. Essential tension: Blood pressure stable and diet was emphasized.  Lifestyle modification urged. Mixed dyslipidemia: On lipid-lowering medications.  Lipids followed by primary care. Diabetes mellitus: Diet was emphasized and lifestyle modification urged. Paroxysmal atrial fibrillation:I discussed with the patient atrial fibrillation, disease process. Management and therapy including rate and rhythm control, anticoagulation benefits and potential risks were discussed extensively with the patient. Patient had multiple questions which were answered to patient's satisfaction Patient will be seen in follow-up appointment in 6 months or earlier if the patient has any concerns    Medication Adjustments/Labs and Tests Ordered: Current medicines are reviewed at length with the patient today.  Concerns regarding medicines are outlined above.  No orders of the defined types were placed in this encounter.  No orders of the defined types were placed in this encounter.    No chief complaint on file.    History of Present Illness:     Courtney Gardner is a 71 y.o. female.  Patient has past medical history of coronary artery disease post CABG surgery, essential hypertension, dyslipidemia, diabetes mellitus and paroxysmal atrial fibrillation.  She denies any problems at this time and takes care of activities of daily living.  No chest pain orthopnea or PND.  At the time of my evaluation, the patient is alert awake oriented and in no distress.  Past Medical History:  Diagnosis Date   Abdominal bruit 12/12/2019   AKI (acute kidney injury) ([place]) 10/17/2019   Angina pectoris ([place]) 11/20/2017   Barrett's esophagus 09/30/2019   Cancer ([place])    Coronary artery disease involving native coronary artery of native heart 12/26/2017   Diabetes mellitus due to underlying condition with unspecified complications ([place]) 02/58/5277   Type 2, on metformin   Dysrhythmia    Essential hypertension 11/20/2017   Ex-smoker 11/20/2017   Glaucoma 09/30/2019   Headache 11/10/2020   History of loop recorder 08/28/2021   Hyperlipidemia 11/20/2017   Hyperlipidemia associated with type 2 diabetes mellitus ([place]) 10/2017   Hypertension associated with diabetes ([place]) 11/20/2017   Hypothyroidism 10/17/2019   Iliotibial band syndrome of left side 09/09/2015   Lacunar infarction ([place]) 02/19/2020   Mixed hyperlipidemia 02/18/2014   Osteoporosis without current pathological fracture 07/04/2021   Palpitations 08/12/2019   Paroxysmal atrial fibrillation ([place]) 10/17/2019   Pleural effusion on left 10/17/2019   Pulmonary embolism ([place]) 11/10/2020   Renal calculi 09/30/2019   Skin cancer of arm, left    Syncope 02/19/2020   Vitamin D deficiency, unspecified 07/09/2019    Past Surgical History:  Procedure Laterality Date   CHOLECYSTECTOMY  CORONARY ARTERY BYPASS GRAFT N/A 10/02/2019   Procedure: CORONARY ARTERY BYPASS GRAFTING (CABG) TIMES TWO, ON PUMP, USING LEFT INTERNAL MAMMARY ARTERY AND RIGHT GREATER SAPHENOUS VEIN HARVESTED ENDOSCOPICALLY;  Surgeon:  Courtney Matte, MD;  Location: [place];  Service: Open Heart Surgery;  Laterality: N/A;  LIMA to LAD, SVG to OM 1   INTRAVASCULAR PRESSURE WIRE/FFR STUDY N/A 11/23/2017   Procedure: INTRAVASCULAR PRESSURE WIRE/FFR STUDY;  Surgeon: Courtney Booze, MD;  Location: [place] [facility];  Service: Cardiovascular;  Laterality: N/A;   IR THORACENTESIS ASP PLEURAL SPACE W/IMG GUIDE  12/04/2019   LEFT HEART CATH AND CORONARY ANGIOGRAPHY N/A 11/23/2017   Procedure: LEFT HEART CATH AND CORONARY ANGIOGRAPHY;  Surgeon: Courtney Booze, MD;  Location: [place] [facility];  Service: Cardiovascular;  Laterality: N/A;   LEFT HEART CATH AND CORONARY ANGIOGRAPHY N/A 09/30/2019   Procedure: LEFT HEART CATH AND CORONARY ANGIOGRAPHY;  Surgeon: Courtney Sine, MD;  Location: [place] CV LAB;  Service: Cardiovascular;  Laterality: N/A;   MEDIASTINAL EXPLORATION N/A 10/03/2019   Procedure: MEDIASTINAL WASHOUT;  Surgeon: Courtney Matte, MD;  Location: [place];  Service: Thoracic;  Laterality: N/A;   TEE WITHOUT CARDIOVERSION N/A 10/02/2019   Procedure: TRANSESOPHAGEAL ECHOCARDIOGRAM (TEE);  Surgeon: Courtney Matte, MD;  Location: [place];  Service: Open Heart Surgery;  Laterality: N/A;   TUBAL LIGATION      Current Medications: Current Meds  Medication Sig   amiodarone (PACERONE) 200 MG tablet Take 0.5 tablets (100 mg total) by mouth daily.   amLODipine (NORVASC) 10 MG tablet Take 1 tablet by mouth once daily   apixaban (ELIQUIS) 5 MG TABS tablet Take 1 tablet (5 mg total) by mouth 2 (two) times daily.   atorvastatin (LIPITOR) 80 MG tablet Take 80 mg by mouth daily.   ezetimibe (ZETIA) 10 MG tablet Take 1 tablet by mouth once daily   hydrALAZINE (APRESOLINE) 50 MG tablet Take 1 tablet (50 mg total) by mouth every 8 (eight) hours.   levothyroxine (SYNTHROID, LEVOTHROID) 50 MCG tablet Take 50 mcg by mouth daily before breakfast.    lisinopril (ZESTRIL) 10 MG tablet Take 10 mg by mouth daily.   metFORMIN  (GLUCOPHAGE) 500 MG tablet Take 500 mg by mouth daily with breakfast.    nitroGLYCERIN (NITROSTAT) 0.4 MG SL tablet Place 0.4 mg under the tongue every 5 (five) minutes as needed for chest pain.   Nutritional Supplements (BOOST HIGH PROTEIN PO) Take 1 Can by mouth in the morning and at bedtime.   omeprazole (PRILOSEC) 40 MG capsule Take 1 capsule by mouth once daily   PARoxetine (PAXIL) 10 MG tablet Take 10 mg by mouth daily.     Allergies:   Patient has no known allergies.   Social History   Socioeconomic History   Marital status: Unknown    Spouse name: Not on file   Number of children: Not on file   Years of education: Not on file   Highest education level: Not on file  Occupational History   Not on file  Tobacco Use   Smoking status: Former   Smokeless tobacco: Never  Vaping Use   Vaping Use: Never used  Substance and Sexual Activity   Alcohol use: Not Currently   Drug use: Never   Sexual activity: Not on file  Other Topics Concern   Not on file  Social History Narrative   Not on file   Social Determinants of Health   Financial Resource Strain:  Not on file  Food Insecurity: Not on file  Transportation Needs: Not on file  Physical Activity: Not on file  Stress: Not on file  Social Connections: Not on file     Family History: The patient's family history includes Heart attack in her maternal grandfather, maternal grandmother, and maternal uncle; Hypertension in her sister; Leukemia in her mother; Lung cancer in her father.  ROS:   Please see the history of present illness.    All other systems reviewed and are negative.  EKGs/Labs/Other Studies Reviewed:    The following studies were reviewed today: EKG reveals sinus rhythm and nonspecific ST-T changes   Recent Labs: 05/07/2021: ALT 30; BUN 30; Creatinine, Ser 1.30; Hemoglobin 13.9; Platelets 394; Potassium 3.9; Sodium 138 09/02/2021: TSH 2.300  Recent Lipid Panel    Component Value Date/Time   CHOL 186  11/09/2020 0027   CHOL 201 (H) 06/14/2020 1055   TRIG 100 11/09/2020 0027   HDL 62 11/09/2020 0027   HDL 78 06/14/2020 1055   CHOLHDL 3.0 11/09/2020 0027   VLDL 20 11/09/2020 0027   LDLCALC 104 (H) 11/09/2020 0027   LDLCALC 109 (H) 06/14/2020 1055    Physical Exam:    VS:  BP 134/60   Pulse 62   Ht '5\' 6"'$  (1.676 m)   Wt 141 lb 9.6 oz (64.2 kg)   SpO2 97%   BMI 22.85 kg/m     Wt Readings from Last 3 Encounters:  12/16/21 141 lb 9.6 oz (64.2 kg)  08/29/21 136 lb 6.4 oz (61.9 kg)  05/07/21 141 lb 1.5 oz (64 kg)     GEN: Patient is in no acute distress HEENT: Normal NECK: No JVD; No carotid bruits LYMPHATICS: No lymphadenopathy CARDIAC: Hear sounds regular, 2/6 systolic murmur at the apex. RESPIRATORY:  Clear to auscultation without rales, wheezing or rhonchi  ABDOMEN: Soft, non-tender, non-distended MUSCULOSKELETAL:  No edema; No deformity  SKIN: Warm and dry NEUROLOGIC:  Alert and oriented x 3 PSYCHIATRIC:  Normal affect   Signed, Courtney Lindau, MD  12/16/2021 2:27 PM    [place]

## 2021-12-23 ENCOUNTER — Telehealth: Payer: Self-pay

## 2021-12-23 NOTE — Telephone Encounter (Signed)
Pt called stating she got a new phone and needs help putting the app on her new phone. She states she will be able to come in the office on 12/29/2021 to get help. I told her to ask for [facility] or [place] and one of [place] will be more than happy to help her with the app. The patient verbalized understanding and thanked me for my help.

## 2021-12-27 ENCOUNTER — Ambulatory Visit: Payer: Medicare Other

## 2021-12-30 ENCOUNTER — Ambulatory Visit: Payer: Medicare Other | Attending: Family Medicine

## 2022-01-01 NOTE — Progress Notes (Signed)
[facility] Summary Report / Loop Recorder 

## 2022-01-16 ENCOUNTER — Telehealth: Payer: Self-pay

## 2022-01-16 ENCOUNTER — Ambulatory Visit: Payer: Medicare Other | Attending: Cardiology

## 2022-01-16 DIAGNOSIS — R0989 Other specified symptoms and signs involving the circulatory and respiratory systems: Secondary | ICD-10-CM

## 2022-01-16 NOTE — Telephone Encounter (Signed)
-----   Message from Jenean Lindau, MD sent at 01/16/2022  5:04 PM EDT ----- The results of the study is unremarkable. Please inform patient. I will discuss in detail at next appointment. Cc  primary care/referring physician Jenean Lindau, MD 01/16/2022 5:04 PM

## 2022-01-17 NOTE — Telephone Encounter (Signed)
Spoke with pt about [place] results. She verbalized understanding and had no further questions.

## 2022-01-17 NOTE — Telephone Encounter (Signed)
Patient was returning call. Please advise ?

## 2022-01-17 NOTE — Telephone Encounter (Signed)
Patient is following up, again requesting to review results.

## 2022-03-22 ENCOUNTER — Telehealth: Payer: Self-pay

## 2022-03-22 NOTE — Telephone Encounter (Signed)
ILR alert summary report received. Battery status OK. Normal device function. No new symptom, brady, episodes. No new AF episodes. Monthly summary reports and ROV/PRN Since 7/22 report there have been 4 pause events, 3-5sec in duration during waking hours - route to triage 1 new tachy event, 41sec in duration, HR 158  Outreach made to Pt.  She has not had any syncopal episodes.  States that on 03/07/22 she had a "bad day".   She describes when she goes to the store that she sometimes has to go back to the truck and sit down d/t weakness.  Pt had recent PCP visit and found to be orthostatic.  PCP is working to manage BP medications.  Pt also states she had a pain under her left breast.  Pt with h/o of CAD/CABG.   She states it does not feel as bad as she did prior to her open heart surgery, but she is concerned about her heart.    First available appt with Dr. Geraldo Pitter was in January.  Dr. Caryl Comes first available end of December.  Offered sooner follow up with DOD-but Pt refused and stated she wants to see someone she knows.  Appt made in December with Dr. Caryl Comes for evaluation.  Advised Pt if she has a syncopal episode to call device clinic.  Advised for worsening chest discomfort to go to the ER. Pt indicates understanding.

## 2022-04-07 ENCOUNTER — Ambulatory Visit (INDEPENDENT_AMBULATORY_CARE_PROVIDER_SITE_OTHER): Payer: Medicare Other

## 2022-04-07 ENCOUNTER — Telehealth: Payer: Self-pay

## 2022-04-07 DIAGNOSIS — R55 Syncope and collapse: Secondary | ICD-10-CM | POA: Diagnosis not present

## 2022-04-07 LAB — CUP PACEART REMOTE DEVICE CHECK
Date Time Interrogation Session: 20231207231440
Implantable Pulse Generator Implant Date: 20220720

## 2022-04-07 NOTE — Telephone Encounter (Signed)
Following alert received from [employer] received for 1 new tachy event, 41sec in duration, HR 158), EPIC indicates f/u in December. Presenting rhythm ST/AFL, hx of PAF, Eliquis.  Patient reports asymptomatic for past several days and was unaware of elevated heart rate. States she has been up doing things and active and could have been during time presenting rhythm. Compliant with medications.   Routing to Dr. Caryl Comes for review.

## 2022-04-07 NOTE — Telephone Encounter (Signed)
Secure message sent to Dr. Aquilla Hacker covering nurse in clinic today for follow up since he is in clinic.   Patient given ED precautions for symptoms until we hear back from Dr. Caryl Comes. Voiced understanding and aware to call if she has further questions or concerns.

## 2022-04-10 NOTE — Telephone Encounter (Signed)
Courtney Gardner   can we get this lady into see Jonni Sanger to discuss adjuctive BB for sinus/atrial tach  also the possiblity of stopping the [employer]

## 2022-04-25 ENCOUNTER — Encounter: Payer: Self-pay | Admitting: Internal Medicine

## 2022-04-25 ENCOUNTER — Ambulatory Visit: Payer: Medicare Other | Attending: Internal Medicine | Admitting: Internal Medicine

## 2022-04-25 ENCOUNTER — Other Ambulatory Visit: Payer: Self-pay | Admitting: Cardiology

## 2022-04-25 VITALS — BP 116/66 | HR 63 | Ht 66.0 in | Wt 146.6 lb

## 2022-04-25 DIAGNOSIS — I2510 Atherosclerotic heart disease of native coronary artery without angina pectoris: Secondary | ICD-10-CM

## 2022-04-25 DIAGNOSIS — Z79899 Other long term (current) drug therapy: Secondary | ICD-10-CM

## 2022-04-25 DIAGNOSIS — R55 Syncope and collapse: Secondary | ICD-10-CM | POA: Diagnosis not present

## 2022-04-25 DIAGNOSIS — E782 Mixed hyperlipidemia: Secondary | ICD-10-CM

## 2022-04-25 DIAGNOSIS — Z9889 Other specified postprocedural states: Secondary | ICD-10-CM

## 2022-04-25 DIAGNOSIS — I480 Paroxysmal atrial fibrillation: Secondary | ICD-10-CM

## 2022-04-25 DIAGNOSIS — Z01812 Encounter for preprocedural laboratory examination: Secondary | ICD-10-CM

## 2022-04-25 MED ORDER — METOPROLOL TARTRATE 50 MG PO TABS: 50.0000 mg | ORAL_TABLET | ORAL | 0 refills | Status: DC

## 2022-04-25 NOTE — Patient Instructions (Signed)
Medication Instructions:  The current medical regimen is effective;  continue present plan and medications.  *If you need a refill on your cardiac medications before your next appointment, please call your pharmacy*   Lab Work: Please have blood work today (Liver, Lipid, Direct LDL, TSH, BMP)  If you have labs (blood work) drawn today and your tests are completely normal, you will receive your results only by: [place] (if you have MyChart) OR A paper copy in the mail If you have any lab test that is abnormal or we need to change your treatment, we will call you to review the results.   Testing/Procedures:   Your cardiac CT will be scheduled at:   [facility] [address] [place], [place] [address] [phone]  Please arrive at the [facility] (Entrance C2) of [facility] 30 minutes prior to test start time. You can use the FREE valet parking offered at entrance C (encouraged to control the heart rate for the test)  Proceed to the [facility] Radiology Department (first floor) to check-in and test prep.  All radiology patients and guests should use entrance C2 at [facility], accessed from [facility], even though the hospital's physical address listed is [address]     Please follow these instructions carefully (unless otherwise directed):  On the Night Before the Test: Be sure to Drink plenty of water. Do not consume any caffeinated/decaffeinated beverages or chocolate 12 hours prior to your test. Do not take any antihistamines 12 hours prior to your test.  On the Day of the Test: Drink plenty of water until 1 hour prior to the test. Do not eat any food 1 hour prior to test. You may take your regular medications prior to the test.  Take metoprolol (Lopressor) two hours prior to test. HOLD Furosemide/Hydrochlorothiazide morning of the test. FEMALES- please wear underwire-free bra if  available, avoid dresses & tight clothing  After the Test: Drink plenty of water. After receiving IV contrast, you may experience a mild flushed feeling. This is normal. On occasion, you may experience a mild rash up to 24 hours after the test. This is not dangerous. If this occurs, you can take Benadryl 25 mg and increase your fluid intake. If you experience trouble breathing, this can be serious. If it is severe call 911 IMMEDIATELY. If it is mild, please call our office. If you take any of these medications: Glipizide/Metformin, Avandament, Glucavance, please do not take 48 hours after completing test unless otherwise instructed.  We will call to schedule your test 2-4 weeks out understanding that some insurance companies will need an authorization prior to the service being performed.   For non-scheduling related questions, please contact the cardiac imaging nurse navigator should you have any questions/concerns: Marchia Bond, Cardiac Imaging Nurse Navigator Gordy Clement, Cardiac Imaging Nurse Navigator [facility] Direct Office Dial: [phone]   For scheduling needs, including cancellations and rescheduling, please call [place], [phone].  Your physician has recommended that you have a pulmonary function test. Pulmonary Function Tests are a group of tests that measure how well air moves in and out of your lungs. You will be contacted to be scheduled for this testing.  Follow-Up: At [facility], you and your health needs are our priority.  As part of our continuing mission to provide you with exceptional heart care, we have created designated Provider Care Teams.  These Care Teams include  your primary Cardiologist (physician) and Advanced Practice Providers (APPs -  Physician Assistants and Nurse Practitioners) who all work together to provide you with the care you need, when you need it.  We recommend signing up for the patient portal  called "MyChart".  Sign up information is provided on this After Visit Summary.  MyChart is used to connect with patients for Virtual Visits (Telemedicine).  Patients are able to view lab/test results, encounter notes, upcoming appointments, etc.  Non-urgent messages can be sent to your provider as well.   To learn more about what you can do with MyChart, go to [email or web address].    Your next appointment:   6 month(s)  The format for your next appointment:   In Person  Provider:   Dr Caryl Comes      Important Information About [place]

## 2022-04-25 NOTE — Progress Notes (Signed)
Patient ID: BRIDGETTE WOLDEN, female   DOB: 08-07-1950, 71 y.o.   MRN: [id number]      Patient Care Team: Earna Coder, NP as PCP - General (Family Medicine) Revankar, Reita Cliche, MD as PCP - Cardiology (Cardiology) Deboraha Sprang, MD as PCP - Electrophysiology (Cardiology)   HPI                        Courtney Gardner is a 71 y.o. female seen In follow-up for   syncope  for which she underwent loop recorder implantation. it was thought to be neurally mediated, although it occurred in the context of coronary artery disease with prior bypass and normal LV function.  History of atrial fibrillation flutter on amiodarone.  Also on anticoagulation as she had a pulmonary embolism with >>.  No chest pain.  No edema.  No nocturnal dyspnea.  Does have shortness of breath and weakness with prolonged walking.  Has had events on her device consistent with neurally mediated bradycardia    Date   Cr              K Hgb TSH LFTs LDL  10/21 1.32 4.1  12.0     7/22  1.41 3.8   13.2 1.71  104  1/23 1.30 3.9 13.9 2.3 (5/23) 30     DATE TEST EF%   07/19 Echo  60-65 %   7/19 LHC  55-65 %   4/21 MyoView 44%   5/21 Echo 50-55%   6/21 LHC 60-65%   6/12 Echo 60-65%   7/22 Echo 84-16%    Thromboembolic risk factors ( age -16, HTN-1, DM-1, Vasc disease -1, Gender-1) for a CHADSVASc Score of >=5   Past Medical History:  Diagnosis Date   Abdominal bruit 12/12/2019   AKI (acute kidney injury) ([place]) 10/17/2019   Angina pectoris ([place]) 11/20/2017   Barrett's esophagus 09/30/2019   Cancer ([place])    Coronary artery disease involving native coronary artery of native heart 12/26/2017   Diabetes mellitus due to underlying condition with unspecified complications ([place]) 60/63/0160   Type 2, on metformin   Dysrhythmia    Essential hypertension 11/20/2017   Ex-smoker 11/20/2017   Glaucoma 09/30/2019   Headache 11/10/2020   History of loop recorder 08/28/2021   Hyperlipidemia 11/20/2017   Hyperlipidemia  associated with type 2 diabetes mellitus ([place]) 10/2017   Hypertension associated with diabetes ([place]) 11/20/2017   Hypothyroidism 10/17/2019   Iliotibial band syndrome of left side 09/09/2015   Lacunar infarction ([place]) 02/19/2020   Mixed hyperlipidemia 02/18/2014   Osteoporosis without current pathological fracture 07/04/2021   Palpitations 08/12/2019   Paroxysmal atrial fibrillation ([place]) 10/17/2019   Pleural effusion on left 10/17/2019   Pulmonary embolism ([place]) 11/10/2020   Renal calculi 09/30/2019   Skin cancer of arm, left    Syncope 02/19/2020   Vitamin D deficiency, unspecified 07/09/2019    Past Surgical History:  Procedure Laterality Date   CHOLECYSTECTOMY     CORONARY ARTERY BYPASS GRAFT N/A 10/02/2019   Procedure: CORONARY ARTERY BYPASS GRAFTING (CABG) TIMES TWO, ON PUMP, USING LEFT INTERNAL MAMMARY ARTERY AND RIGHT GREATER SAPHENOUS VEIN HARVESTED ENDOSCOPICALLY;  Surgeon: [name], MD;  Location: [place];  Service: Open Heart Surgery;  Laterality: N/A;  LIMA to LAD, SVG to OM 1   INTRAVASCULAR PRESSURE WIRE/FFR STUDY N/A 11/23/2017   Procedure: INTRAVASCULAR PRESSURE WIRE/FFR STUDY;  Surgeon: Jettie Booze, MD;  Location: [place] [facility];  Service: Cardiovascular;  Laterality: N/A;   IR THORACENTESIS ASP PLEURAL SPACE W/IMG GUIDE  12/04/2019   LEFT HEART CATH AND CORONARY ANGIOGRAPHY N/A 11/23/2017   Procedure: LEFT HEART CATH AND CORONARY ANGIOGRAPHY;  Surgeon: Jettie Booze, MD;  Location: [place] [facility];  Service: Cardiovascular;  Laterality: N/A;   LEFT HEART CATH AND CORONARY ANGIOGRAPHY N/A 09/30/2019   Procedure: LEFT HEART CATH AND CORONARY ANGIOGRAPHY;  Surgeon: Troy Sine, MD;  Location: [place] [facility];  Service: Cardiovascular;  Laterality: N/A;   MEDIASTINAL EXPLORATION N/A 10/03/2019   Procedure: MEDIASTINAL WASHOUT;  Surgeon: [name], MD;  Location: [place];  Service: Thoracic;  Laterality: N/A;   TEE WITHOUT CARDIOVERSION  N/A 10/02/2019   Procedure: TRANSESOPHAGEAL ECHOCARDIOGRAM (TEE);  Surgeon: [name], MD;  Location: [facility];  Service: Open Heart Surgery;  Laterality: N/A;   TUBAL LIGATION      Current Meds  Medication Sig   amiodarone (PACERONE) 200 MG tablet Take 0.5 tablets (100 mg total) by mouth daily.   amLODipine (NORVASC) 10 MG tablet Take 1 tablet by mouth once daily   apixaban (ELIQUIS) 5 MG TABS tablet Take 1 tablet (5 mg total) by mouth 2 (two) times daily.   atorvastatin (LIPITOR) 80 MG tablet Take 80 mg by mouth daily.   ezetimibe (ZETIA) 10 MG tablet Take 1 tablet by mouth once daily   hydrALAZINE (APRESOLINE) 50 MG tablet Take 1 tablet (50 mg total) by mouth every 8 (eight) hours.   levothyroxine (SYNTHROID, LEVOTHROID) 50 MCG tablet Take 50 mcg by mouth daily before breakfast.    lisinopril (ZESTRIL) 10 MG tablet Take 10 mg by mouth daily.   metFORMIN (GLUCOPHAGE) 500 MG tablet Take 500 mg by mouth daily with breakfast.    nitroGLYCERIN (NITROSTAT) 0.4 MG SL tablet Place 0.4 mg under the tongue every 5 (five) minutes as needed for chest pain.   Nutritional Supplements (BOOST HIGH PROTEIN PO) Take 1 Can by mouth in the morning and at bedtime.   omeprazole (PRILOSEC) 40 MG capsule Take 1 capsule by mouth once daily   PARoxetine (PAXIL) 10 MG tablet Take 10 mg by mouth daily.   Semaglutide,0.25 or 0.'5MG'$ /DOS, 2 MG/1.5ML SOPN Inject into the skin.    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  Will continue at 100 mg daily.  Will check amiodarone surveillance laboratories.  No interval syncope but recurrent presyncope triggered by exertion associated with pallor and weakness.  Will need to exclude a Bezold-Jarisch reflex from inferior ischemia.  Undertake CTA.  In this regard also her LDL is not at target.  Last measurements we have are 18 months ago with 104.  Will recheck a  lipid profile today.  And for now we will continue ezetimibe and atorvastatin   Blood pressure is well-controlled on her Apresoline 50 every 8 Zestril 10.

## 2022-04-26 LAB — BASIC METABOLIC PANEL
BUN/Creatinine Ratio: 12 (ref 12–28)
BUN: 14 mg/dL (ref 8–27)
CO2: 22 mmol/L (ref 20–29)
Calcium: 9.8 mg/dL (ref 8.7–10.3)
Chloride: 104 mmol/L (ref 96–106)
Creatinine, Ser: 1.19 mg/dL — ABNORMAL HIGH (ref 0.57–1.00)
Glucose: 123 mg/dL — ABNORMAL HIGH (ref 70–99)
Potassium: 4.9 mmol/L (ref 3.5–5.2)
Sodium: 144 mmol/L (ref 134–144)
eGFR: 49 mL/min/{1.73_m2} — ABNORMAL LOW (ref >59)

## 2022-04-26 LAB — LDL CHOLESTEROL, DIRECT: LDL Direct: 46 mg/dL (ref 0–99)

## 2022-04-26 LAB — LIPID PANEL
Chol/HDL Ratio: 1.8 ratio (ref 0.0–4.4)
Cholesterol, Total: 122 mg/dL (ref 100–199)
HDL: 67 mg/dL (ref >39)
LDL Chol Calc (NIH): 41 mg/dL (ref 0–99)
Triglycerides: 67 mg/dL (ref 0–149)
VLDL Cholesterol Cal: 14 mg/dL (ref 5–40)

## 2022-04-26 LAB — HEPATIC FUNCTION PANEL
ALT: 28 IU/L (ref 0–32)
AST: 21 IU/L (ref 0–40)
Albumin: 4.5 g/dL (ref 3.8–4.8)
Alkaline Phosphatase: 93 IU/L (ref 44–121)
Bilirubin Total: 0.5 mg/dL (ref 0.0–1.2)
Bilirubin, Direct: 0.17 mg/dL (ref 0.00–0.40)
Total Protein: 6.5 g/dL (ref 6.0–8.5)

## 2022-04-26 LAB — TSH: TSH: 0.05 u[IU]/mL — ABNORMAL LOW (ref 0.450–4.500)

## 2022-04-26 NOTE — Addendum Note (Signed)
Addended by: Sharee Holster R on: 04/26/2022 09:06 AM   Modules accepted: Orders

## 2022-04-28 ENCOUNTER — Telehealth (HOSPITAL_COMMUNITY): Payer: Self-pay | Admitting: Emergency Medicine

## 2022-04-28 NOTE — Telephone Encounter (Signed)
Reaching out to patient to offer assistance regarding upcoming cardiac imaging study; pt verbalizes understanding of appt date/time, parking situation and where to check in, pre-test NPO status and medications ordered, and verified current allergies; name and call back number provided for further questions should they arise Marchia Bond RN Navigator Cardiac Imaging [facility] [phone] office [phone] cell  Arrival 1230 '50mg'$  metoprolol tartrate Hold metformin 48 h post Denies iv issues Aware contrast/nitro

## 2022-04-28 NOTE — Progress Notes (Signed)
[facility] Summary Report / Loop Recorder 

## 2022-05-02 ENCOUNTER — Other Ambulatory Visit: Payer: Self-pay | Admitting: Cardiology

## 2022-05-03 ENCOUNTER — Ambulatory Visit (HOSPITAL_COMMUNITY)
Admission: RE | Admit: 2022-05-03 | Discharge: 2022-05-03 | Disposition: A | Discharge status: Home / Self Care | Payer: Medicare Other | Source: Ambulatory Visit | Attending: Internal Medicine | Admitting: Internal Medicine

## 2022-05-03 DIAGNOSIS — I2510 Atherosclerotic heart disease of native coronary artery without angina pectoris: Secondary | ICD-10-CM | POA: Diagnosis present

## 2022-05-03 MED ORDER — NITROGLYCERIN 0.4 MG SL SUBL
SUBLINGUAL_TABLET | SUBLINGUAL | Status: AC
  Filled 2022-05-03: qty 2

## 2022-05-03 MED ORDER — NITROGLYCERIN 0.4 MG SL SUBL
0.8000 mg | SUBLINGUAL_TABLET | Freq: Once | SUBLINGUAL | Status: AC
  Administered 2022-05-03: 0.8 mg via SUBLINGUAL

## 2022-05-03 MED ORDER — IOHEXOL 350 MG/ML SOLN
95.0000 mL | Freq: Once | INTRAVENOUS | Status: AC | PRN
  Administered 2022-05-03: 95 mL via INTRAVENOUS

## 2022-05-04 ENCOUNTER — Telehealth: Payer: Self-pay | Admitting: Internal Medicine

## 2022-05-04 ENCOUNTER — Other Ambulatory Visit: Payer: Self-pay | Admitting: Cardiology

## 2022-05-04 NOTE — Telephone Encounter (Signed)
Patient is calling requesting CT results. Please advise.

## 2022-05-04 NOTE — Telephone Encounter (Signed)
Rx refill sent to pharmacy. 

## 2022-05-05 NOTE — Telephone Encounter (Signed)
Spoke with pt and advised Dr Caryl Comes has not yet had reviewed CT scan but discussed lab work results with pt per Dr Caryl Comes as below.  Pt states she is currently on Levothyroxine 47mg - 1 tablet by mouth daily.  Pt advised Dr KCaryl Comeswill be out of the office for 2 weeks and due to her being on a different strength of her Levothyroxine than previously thought it is recommended that she go ahead and contact her PCP regarding abnormal TSH.  Pt verbalizes understanding and agrees with current plan. _____________________________________________________________________________________________________________ Please Inform Patient   Labs are normal x too much thyroid>> her dose listed is 50 mcg   if that is correct can we have her decrease to 37.5 and followup with her PCP inabout 2 months   Thanks SK

## 2022-05-09 ENCOUNTER — Ambulatory Visit (INDEPENDENT_AMBULATORY_CARE_PROVIDER_SITE_OTHER): Payer: Medicare Other

## 2022-05-09 DIAGNOSIS — R55 Syncope and collapse: Secondary | ICD-10-CM

## 2022-05-09 LAB — CUP PACEART REMOTE DEVICE CHECK
Date Time Interrogation Session: 20240108231852
Implantable Pulse Generator Implant Date: 20220720

## 2022-05-17 ENCOUNTER — Telehealth: Payer: Self-pay | Admitting: Internal Medicine

## 2022-05-17 NOTE — Telephone Encounter (Signed)
Pt advised that I spoke with the DOD Dr Angelena Form...  and to be reassured that her Grafts are intact and she is getting good blood flow to her heart muscle... but we are still waiting for Dr Caryl Comes to review the findings based on what he was looking for when he ordered the exam.

## 2022-05-17 NOTE — Telephone Encounter (Signed)
Patient is returning call to discuss CT results again.

## 2022-05-18 ENCOUNTER — Emergency Department (HOSPITAL_COMMUNITY): Payer: 59

## 2022-05-18 ENCOUNTER — Other Ambulatory Visit: Payer: Self-pay

## 2022-05-18 ENCOUNTER — Encounter (HOSPITAL_COMMUNITY): Payer: Self-pay | Admitting: Emergency Medicine

## 2022-05-18 ENCOUNTER — Emergency Department (HOSPITAL_COMMUNITY)
Admission: EM | Admit: 2022-05-18 | Discharge: 2022-05-18 | Disposition: A | Discharge status: Home / Self Care | Payer: 59 | Attending: Emergency Medicine | Admitting: Emergency Medicine

## 2022-05-18 DIAGNOSIS — S92411B Displaced fracture of proximal phalanx of right great toe, initial encounter for open fracture: Secondary | ICD-10-CM | POA: Diagnosis not present

## 2022-05-18 DIAGNOSIS — Z79899 Other long term (current) drug therapy: Secondary | ICD-10-CM | POA: Diagnosis not present

## 2022-05-18 DIAGNOSIS — E119 Type 2 diabetes mellitus without complications: Secondary | ICD-10-CM | POA: Diagnosis not present

## 2022-05-18 DIAGNOSIS — I10 Essential (primary) hypertension: Secondary | ICD-10-CM | POA: Insufficient documentation

## 2022-05-18 DIAGNOSIS — Z7984 Long term (current) use of oral hypoglycemic drugs: Secondary | ICD-10-CM | POA: Insufficient documentation

## 2022-05-18 DIAGNOSIS — I2510 Atherosclerotic heart disease of native coronary artery without angina pectoris: Secondary | ICD-10-CM | POA: Diagnosis not present

## 2022-05-18 DIAGNOSIS — W208XXA Other cause of strike by thrown, projected or falling object, initial encounter: Secondary | ICD-10-CM | POA: Insufficient documentation

## 2022-05-18 DIAGNOSIS — Z7901 Long term (current) use of anticoagulants: Secondary | ICD-10-CM | POA: Diagnosis not present

## 2022-05-18 DIAGNOSIS — S90931A Unspecified superficial injury of right great toe, initial encounter: Secondary | ICD-10-CM | POA: Diagnosis present

## 2022-05-18 MED ORDER — CEFAZOLIN SODIUM-DEXTROSE 1-4 GM/50ML-% IV SOLN
1.0000 g | Freq: Once | INTRAVENOUS | Status: AC
  Administered 2022-05-18: 1 g via INTRAVENOUS
  Filled 2022-05-18: qty 50

## 2022-05-18 MED ORDER — FENTANYL CITRATE PF 50 MCG/ML IJ SOSY
50.0000 ug | PREFILLED_SYRINGE | Freq: Once | INTRAMUSCULAR | Status: AC
  Administered 2022-05-18: 50 ug via INTRAVENOUS
  Filled 2022-05-18: qty 1

## 2022-05-18 MED ORDER — CLINDAMYCIN HCL 300 MG PO CAPS: 300.0000 mg | ORAL_CAPSULE | Freq: Three times a day (TID) | ORAL | 0 refills | Status: DC

## 2022-05-18 MED ORDER — LIDOCAINE-EPINEPHRINE 1 %-1:100000 IJ SOLN
10.0000 mL | Freq: Once | INTRAMUSCULAR | Status: AC
  Administered 2022-05-18: 10 mL
  Filled 2022-05-18: qty 1

## 2022-05-18 MED ORDER — CEFDINIR 300 MG PO CAPS: 300.0000 mg | ORAL_CAPSULE | Freq: Two times a day (BID) | ORAL | 0 refills | Status: AC

## 2022-05-18 NOTE — ED Provider Notes (Incomplete)
[facility] EMERGENCY DEPARTMENT Provider Note   CSN: [id number] Arrival date & time: 05/18/22  1133     History {Add pertinent medical, surgical, social history, OB history to HPI:1} Chief Complaint  Patient presents with   Toe Injury    Courtney Gardner is a 72 y.o. female.  Courtney Gardner 72 year old female past medical history of PE on Eliquis presenting to the emergency department after dropping iron skillet on left great toe.  Patient complains of 10 out of 10 pain to the left toe no other injuries.  Last took Eliquis this morning.         Home Medications Prior to Admission medications   Medication Sig Start Date End Date Taking? Authorizing Provider  amiodarone (PACERONE) 200 MG tablet Take 0.5 tablets (100 mg total) by mouth daily. 04/06/21   Revankar, Reita Cliche, MD  amLODipine (NORVASC) 10 MG tablet Take 1 tablet by mouth once daily 12/13/20   Revankar, Reita Cliche, MD  apixaban (ELIQUIS) 5 MG TABS tablet Take 1 tablet (5 mg total) by mouth 2 (two) times daily. 04/21/21   Revankar, Reita Cliche, MD  atorvastatin (LIPITOR) 80 MG tablet Take 80 mg by mouth daily. 02/06/20   [provider]  ezetimibe (ZETIA) 10 MG tablet Take 1 tablet by mouth once daily 04/25/22   Revankar, Reita Cliche, MD  hydrALAZINE (APRESOLINE) 50 MG tablet Take 1 tablet (50 mg total) by mouth every 8 (eight) hours. 04/25/22   Revankar, Reita Cliche, MD  levothyroxine (SYNTHROID, LEVOTHROID) 50 MCG tablet Take 50 mcg by mouth daily before breakfast.  11/12/17   [provider]  lisinopril (ZESTRIL) 10 MG tablet Take 10 mg by mouth daily. 11/04/21   [provider]  metFORMIN (GLUCOPHAGE) 500 MG tablet Take 500 mg by mouth daily with breakfast.  07/21/19   [provider]  metoprolol tartrate (LOPRESSOR) 50 MG tablet Take 1 tablet (50 mg total) by mouth as directed. Take 1 tablet 2 hours before your CT scan 04/25/22   Deboraha Sprang, MD  nitroGLYCERIN (NITROSTAT) 0.4 MG SL  tablet Place 1 tablet (0.4 mg total) under the tongue every 5 (five) minutes as needed for chest pain. 05/04/22   Revankar, Reita Cliche, MD  Nutritional Supplements (BOOST HIGH PROTEIN PO) Take 1 Can by mouth in the morning and at bedtime.    [provider]  omeprazole (PRILOSEC) 40 MG capsule Take 1 capsule by mouth once daily 05/03/22   Revankar, Reita Cliche, MD  PARoxetine (PAXIL) 10 MG tablet Take 10 mg by mouth daily. 06/29/19   [provider]  Semaglutide,0.25 or 0.'5MG'$ /DOS, 2 MG/1.5ML SOPN Inject into the skin.    [provider]      Allergies    Patient has no known allergies.    Review of Systems   Review of Systems  Physical Exam Updated Vital Signs BP 130/72 (BP Location: Right Arm)   Pulse 76   Temp 98.5 F (36.9 C)   Resp 15   Ht '5\' 6"'$  (1.676 m)   Wt 63.5 kg   SpO2 100%   BMI 22.60 kg/m  Physical Exam Vitals and nursing note reviewed.  Constitutional:      General: She is not in acute distress.    Appearance: She is well-developed.  HENT:     Head: Normocephalic and atraumatic.  Eyes:     Conjunctiva/sclera: Conjunctivae normal.  Cardiovascular:     Rate and Rhythm: Normal rate and regular rhythm.  Heart sounds: No murmur heard. Pulmonary:     Effort: Pulmonary effort is normal. No respiratory distress.     Breath sounds: Normal breath sounds.  Abdominal:     Palpations: Abdomen is soft.     Tenderness: There is no abdominal tenderness.  Musculoskeletal:        General: No swelling.     Cervical back: Neck supple.  Feet:     Comments: Avulsion of left great toenail.   Puncture wound to underside of great toe Skin:    General: Skin is warm and dry.     Capillary Refill: Capillary refill takes less than 2 seconds.  Neurological:     Mental Status: She is alert.  Psychiatric:        Mood and Affect: Mood normal.     ED Results / Procedures / Treatments   Labs (all labs ordered are listed, but only abnormal results are  displayed) Labs Reviewed - No data to display  EKG None  Radiology DG Foot Complete Right  Result Date: 05/18/2022 CLINICAL DATA:  Dropped cast iron skeletal on right great toe approximately 3 hours ago. Right great toenail is barely hang on. Foot wrapped uncontrolled bleeding. EXAM: RIGHT FOOT COMPLETE - 3+ VIEW COMPARISON:  None Available. FINDINGS: There has bandage material overlying the great toe, limiting evaluation of fine bony detail. There appears to be multidirectional curvilinear lucency throughout the mid to distal aspect of the distal phalanx of the great toe, comminuted fractures without significant displacement but with approximately 2 mm cortical step-off at the proximal medial aspect of the fracture lines. Mild-to-moderate joint space narrowing of the second through fifth interphalangeal joints diffusely. Moderate plantar and posterior calcaneal heel spurs. Moderate dorsal navicular degenerative osteophytosis at the talonavicular joint. IMPRESSION: Acute comminuted, extra-articular fracture of the distal phalanx of the great toe without significant displacement but with approximately 2 mm cortical step-off. Electronically Signed   By: Yvonne Kendall M.D.   On: 05/18/2022 13:15    Procedures Procedures  {Document cardiac monitor, telemetry assessment procedure when appropriate:1}  Medications Ordered in ED Medications  ceFAZolin (ANCEF) IVPB 1 g/50 mL premix (has no administration in time range)  fentaNYL (SUBLIMAZE) injection 50 mcg (has no administration in time range)    ED Course/ Medical Decision Making/ A&P   {   Click here for ABCD2, HEART and other calculatorsREFRESH Note before signing :1}                          Medical Decision Making Amount and/or Complexity of Data Reviewed Radiology: ordered.  Risk Prescription drug management.   ***  {Document critical care time when appropriate:1} {Document review of labs and clinical decision tools ie heart score,  Chads2Vasc2 etc:1}  {Document your independent review of radiology images, and any outside records:1} {Document your discussion with family members, caretakers, and with consultants:1} {Document social determinants of health affecting pt's care:1} {Document your decision making why or why not admission, treatments were needed:1} Final Clinical Impression(s) / ED Diagnoses Final diagnoses:  None    Rx / DC Orders ED Discharge Orders     None

## 2022-05-18 NOTE — ED Provider Notes (Signed)
[facility] EMERGENCY DEPARTMENT Provider Note   CSN: [id number] Arrival date & time: 05/18/22  1133     History  Chief Complaint  Patient presents with   Toe Injury    Courtney Gardner is a 72 y.o. female.  Courtney Gardner is a 72 year old female past medical history of hypertension, coronary artery disease, paroxysmal atrial fibrillation on Eliquis presenting to the emergency department for an injury to her right rate toe she was making breakfast this morning when she dropped an iron skillet on her right toe causing a crush injury and nail avulsion no other injuries are present.  Patient states that pain is 10 out of 10 in the affected extremity.        Home Medications Prior to Admission medications   Medication Sig Start Date End Date Taking? Authorizing Provider  amiodarone (PACERONE) 200 MG tablet Take 0.5 tablets (100 mg total) by mouth daily. 04/06/21   Revankar, Reita Cliche, MD  amLODipine (NORVASC) 10 MG tablet Take 1 tablet by mouth once daily 12/13/20   Revankar, Reita Cliche, MD  apixaban (ELIQUIS) 5 MG TABS tablet Take 1 tablet (5 mg total) by mouth 2 (two) times daily. 04/21/21   Revankar, Reita Cliche, MD  atorvastatin (LIPITOR) 80 MG tablet Take 80 mg by mouth daily. 02/06/20   [provider]  ezetimibe (ZETIA) 10 MG tablet Take 1 tablet by mouth once daily 04/25/22   Revankar, Reita Cliche, MD  hydrALAZINE (APRESOLINE) 50 MG tablet Take 1 tablet (50 mg total) by mouth every 8 (eight) hours. 04/25/22   Revankar, Reita Cliche, MD  levothyroxine (SYNTHROID, LEVOTHROID) 50 MCG tablet Take 50 mcg by mouth daily before breakfast.  11/12/17   [provider]  lisinopril (ZESTRIL) 10 MG tablet Take 10 mg by mouth daily. 11/04/21   [provider]  metFORMIN (GLUCOPHAGE) 500 MG tablet Take 500 mg by mouth daily with breakfast.  07/21/19   [provider]  metoprolol tartrate (LOPRESSOR) 50 MG tablet Take 1 tablet (50 mg total) by mouth as directed.  Take 1 tablet 2 hours before your CT scan 04/25/22   Deboraha Sprang, MD  nitroGLYCERIN (NITROSTAT) 0.4 MG SL tablet Place 1 tablet (0.4 mg total) under the tongue every 5 (five) minutes as needed for chest pain. 05/04/22   Revankar, Reita Cliche, MD  Nutritional Supplements (BOOST HIGH PROTEIN PO) Take 1 Can by mouth in the morning and at bedtime.    [provider]  omeprazole (PRILOSEC) 40 MG capsule Take 1 capsule by mouth once daily 05/03/22   Revankar, Reita Cliche, MD  PARoxetine (PAXIL) 10 MG tablet Take 10 mg by mouth daily. 06/29/19   [provider]  Semaglutide,0.25 or 0.'5MG'$ /DOS, 2 MG/1.5ML SOPN Inject into the skin.    [provider]      Allergies    Patient has no known allergies.    Review of Systems   Review of Systems  Physical Exam Updated Vital Signs BP 130/72 (BP Location: Right Arm)   Pulse 76   Temp 98.5 F (36.9 C)   Resp 15   Ht '5\' 6"'$  (1.676 m)   Wt 63.5 kg   SpO2 100%   BMI 22.60 kg/m  Physical Exam Vitals and nursing note reviewed.  Constitutional:      General: She is not in acute distress.    Appearance: She is well-developed.  HENT:     Head: Normocephalic and atraumatic.  Eyes:  Conjunctiva/sclera: Conjunctivae normal.  Cardiovascular:     Rate and Rhythm: Normal rate and regular rhythm.     Heart sounds: No murmur heard. Pulmonary:     Effort: Pulmonary effort is normal. No respiratory distress.     Breath sounds: Normal breath sounds.  Abdominal:     Palpations: Abdomen is soft.     Tenderness: There is no abdominal tenderness.  Musculoskeletal:        General: No swelling.     Cervical back: Neck supple.     Right foot: Decreased range of motion. Swelling, deformity, laceration and bony tenderness present.  Skin:    General: Skin is warm and dry.     Capillary Refill: Capillary refill takes less than 2 seconds.  Neurological:     General: No focal deficit present.     Mental Status: She is alert. Mental status is  at baseline.  Psychiatric:        Mood and Affect: Mood normal.     ED Results / Procedures / Treatments   Labs (all labs ordered are listed, but only abnormal results are displayed) Labs Reviewed - No data to display  EKG None  Radiology DG Foot Complete Right  Result Date: 05/18/2022 CLINICAL DATA:  Dropped cast iron skeletal on right great toe approximately 3 hours ago. Right great toenail is barely hang on. Foot wrapped uncontrolled bleeding. EXAM: RIGHT FOOT COMPLETE - 3+ VIEW COMPARISON:  None Available. FINDINGS: There has bandage material overlying the great toe, limiting evaluation of fine bony detail. There appears to be multidirectional curvilinear lucency throughout the mid to distal aspect of the distal phalanx of the great toe, comminuted fractures without significant displacement but with approximately 2 mm cortical step-off at the proximal medial aspect of the fracture lines. Mild-to-moderate joint space narrowing of the second through fifth interphalangeal joints diffusely. Moderate plantar and posterior calcaneal heel spurs. Moderate dorsal navicular degenerative osteophytosis at the talonavicular joint. IMPRESSION: Acute comminuted, extra-articular fracture of the distal phalanx of the great toe without significant displacement but with approximately 2 mm cortical step-off. Electronically Signed   By: Yvonne Kendall M.D.   On: 05/18/2022 13:15    Procedures .Marland KitchenLaceration Repair  Date/Time: 05/19/2022 12:33 AM  Performed by: Donzetta Matters, MD Authorized by: Courtney Paris, MD   Consent:    Consent obtained:  Verbal   Consent given by:  Patient   Risks discussed:  Infection, pain, retained foreign body, need for additional repair, poor cosmetic result, tendon damage, nerve damage, poor wound healing and vascular damage   Alternatives discussed:  No treatment Universal protocol:    Patient identity confirmed:  Verbally with patient and arm band Anesthesia:     Anesthesia method:  Local infiltration   Local anesthetic:  Lidocaine 1% WITH epi Laceration details:    Location:  Toe   Toe location:  R big toe   Length (cm):  2   Depth (mm):  1 Pre-procedure details:    Preparation:  Patient was prepped and draped in usual sterile fashion Exploration:    Limited defect created (wound extended): yes     Hemostasis achieved with:  Epinephrine, direct pressure and tourniquet   Imaging obtained: x-ray     Imaging outcome: foreign body not noted     Wound exploration: wound explored through full range of motion     Wound extent: underlying fracture     Contaminated: no   Treatment:    Area cleansed with:  Saline  Amount of cleaning:  Standard   Irrigation solution:  Sterile saline   Irrigation volume:  523m   Irrigation method:  Syringe   Visualized foreign bodies/material removed: no     Debridement:  None   Undermining:  None   Scar revision: no   Skin repair:    Repair method:  Sutures   Suture size:  5-0   Suture material:  Prolene   Suture technique:  Figure eight and simple interrupted   Number of sutures:  4 Approximation:    Approximation:  Close Repair type:    Repair type:  Complex Post-procedure details:    Dressing: combat gauze.   Procedure completion:  Tolerated     Medications Ordered in ED Medications - No data to display  ED Course/ Medical Decision Making/ A&P                             Medical Decision Making DADDYSIN PORCOis a 72year old female past medical history as documented above presenting to the emergency department after crush injury to right great toe.  On initial physical exam patient is having paresthesias to the affected toe x-ray imaging shows An acute comminuted extra-articular fracture of the distal phalanx of the great toe without significant placement but with approximate 2 mm cortical step-off.  Patient has nearly constant venous oozing from the injury most likely in the setting of her  Eliquis usage.  Given the complex crush nature of the injury we opted to reach out to orthopedics.  Through phone consultation it was determined that patient would be suitable for ED wound closure outpatient orthopedics follow-up.  In discussion with the patient on the nature of saving the nail for germinal matrix retention patient states that she would simply like her nail removed.  I stated the risks that without proper placement of a nail or nail adjunct that her germinal matrix were closed and she may not have a toenail patient adamantly refused maintenance of the germinal matrix and simply requested nail removal and nailbed closure.  She states "I am 71 I do not need my toenail".  Therefore we were able to suture the nailbed closed with 5-0 Prolene I attempted this with fast-absorbing gut however given the thin nature of the suture material I was unable to adequately approximate the large macerated tissue on the nailbed.  It ultimately required for simple interrupted as well as figure-of-eight 5-0 Prolene sutures in order to reapproximate the large nailbed laceration.  Prior to stitching I copiously irrigated the wound for her high risk of infection.  Patient tolerated the procedure without difficulty nailbed laceration was loosely approximated I had to use combat gauze with pressure in order to achieve hemostasis.  Did discuss the risks of the patient's crush injury with her.  I explained that she is at high risk for infection and poor healing given her history of diabetes.  I also discussed the risks of her underlying open fracture.  While in the emergency department we did give 1 g of Ancef patient states that she is compliant with her vaccinations and recently had a Tdap 3 months ago.  Given high risk infection we discharged her with Omnicef twice daily for the next 10 days.  She was given the phone number for the orthopedic specialist for follow-up.  Patient was discharged home with strict return  precautions to return the emergency department if she begins noticing signs of infection.  Problems Addressed: Open displaced fracture of proximal phalanx of right great toe, initial encounter: acute illness or injury that poses a threat to life or bodily functions  Amount and/or Complexity of Data Reviewed Radiology: ordered.  Risk Prescription drug management.           Final Clinical Impression(s) / ED Diagnoses Final diagnoses:  None    Rx / DC Orders ED Discharge Orders     None         Donzetta Matters, MD 05/19/22 0040    Tegeler, Gwenyth Allegra, MD 05/19/22 [id number]

## 2022-05-18 NOTE — ED Triage Notes (Signed)
Pt dropped cast iron skillet on right great toe approx 3 hours ago. Pt states toe nail is barely hanging on, foot is wrapped to control bleeding. Pt state right foot feels numb. Skillet was not hot pt was washing pan prior to using.

## 2022-05-18 NOTE — Telephone Encounter (Signed)
A  good morning There is another message floating around based on Courtney Gardner concern that the proximal grafts could not be visualized and she should undergo either PET or myoview stress  Thanks SK

## 2022-05-18 NOTE — ED Triage Notes (Signed)
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

## 2022-05-18 NOTE — Discharge Instructions (Addendum)
You were seen in the emergency department for your toe fracture while in the emergency department we gave you an IV dose of Ancef.  You have a severe fracture to your toe.  We removed the nail per your request and did a nailbed repair.  This injury is at high risk for infection as well as poor fracture healing please follow-up with the above orthopedic doctor within 7 days.  Please call for an appointment.  You are giving you an antibiotic called cefdinir you will take this medication 2 times daily for the next 10 days. If you notice worsening redness or pus coming from your toe please return to the emergency department soon as possible as this injury is at high risk for infection.  Please follow-up with a physician in the next 10 days to have your stitches removed. Sincerely, Clydie Braun, PGY-2

## 2022-05-25 ENCOUNTER — Telehealth: Payer: Self-pay | Admitting: Cardiology

## 2022-05-25 NOTE — Telephone Encounter (Signed)
   Pt said, she might need to get another CT morph, she wanted to make sure this is the test she got before her open heart surgery in 2021

## 2022-05-26 NOTE — Telephone Encounter (Signed)
Left message to call back  

## 2022-06-02 NOTE — Progress Notes (Signed)
[facility] Summary Report / Loop Recorder 

## 2022-06-12 ENCOUNTER — Ambulatory Visit: Payer: 59

## 2022-06-12 DIAGNOSIS — R55 Syncope and collapse: Secondary | ICD-10-CM | POA: Diagnosis not present

## 2022-06-13 LAB — CUP PACEART REMOTE DEVICE CHECK
Date Time Interrogation Session: 20240210230927
Implantable Pulse Generator Implant Date: 20220720

## 2022-07-14 ENCOUNTER — Ambulatory Visit: Payer: 59

## 2022-07-14 DIAGNOSIS — R55 Syncope and collapse: Secondary | ICD-10-CM | POA: Diagnosis not present

## 2022-07-14 LAB — CUP PACEART REMOTE DEVICE CHECK
Date Time Interrogation Session: 20240314232214
Implantable Pulse Generator Implant Date: 20220720

## 2022-07-27 NOTE — Progress Notes (Signed)
[facility] Summary Report / Loop Recorder 

## 2022-07-31 ENCOUNTER — Other Ambulatory Visit: Payer: Self-pay | Admitting: Cardiology

## 2022-07-31 NOTE — Telephone Encounter (Signed)
Prescription refill request for Eliquis received. Indication: PAF Last office visit: 04/25/22  Olin Pia MD Scr: 1.08 on 06/05/22  Epic Age: 72 Weight: 66.5kg  Based on above findings Eliquis 5mg  twice daily is the appropriate dose.  Refill approved.

## 2022-08-15 ENCOUNTER — Ambulatory Visit (INDEPENDENT_AMBULATORY_CARE_PROVIDER_SITE_OTHER): Payer: 59

## 2022-08-15 DIAGNOSIS — R55 Syncope and collapse: Secondary | ICD-10-CM | POA: Diagnosis not present

## 2022-08-15 NOTE — Progress Notes (Signed)
[facility] Summary Report / Loop Recorder 

## 2022-08-16 LAB — CUP PACEART REMOTE DEVICE CHECK
Date Time Interrogation Session: 20240415231644
Implantable Pulse Generator Implant Date: 20220720

## 2022-09-18 ENCOUNTER — Ambulatory Visit (INDEPENDENT_AMBULATORY_CARE_PROVIDER_SITE_OTHER): Payer: 59

## 2022-09-18 DIAGNOSIS — I480 Paroxysmal atrial fibrillation: Secondary | ICD-10-CM | POA: Diagnosis not present

## 2022-09-18 LAB — CUP PACEART REMOTE DEVICE CHECK
Date Time Interrogation Session: 20240519231518
Implantable Pulse Generator Implant Date: 20220720

## 2022-09-18 NOTE — Progress Notes (Signed)
[facility] Summary Report / Loop Recorder 

## 2022-10-17 NOTE — Progress Notes (Signed)
[facility] Summary Report / Loop Recorder 

## 2022-10-19 ENCOUNTER — Other Ambulatory Visit: Payer: Self-pay | Admitting: Cardiology

## 2022-10-20 ENCOUNTER — Ambulatory Visit: Payer: Medicare Other

## 2022-10-20 DIAGNOSIS — I480 Paroxysmal atrial fibrillation: Secondary | ICD-10-CM

## 2022-10-20 LAB — CUP PACEART REMOTE DEVICE CHECK
Date Time Interrogation Session: 20240620231027
Implantable Pulse Generator Implant Date: 20220720

## 2022-10-23 ENCOUNTER — Telehealth: Payer: Self-pay

## 2022-10-23 NOTE — Telephone Encounter (Signed)
ILR alert for pause Event occurred 6/22 @ 20:09, EGM shows 3.7sec pause Route to triage LA, CVRS  Attempted to reach patient to assess sleep v/s wake and if any symptoms.  Mail box full, unable to leave message.

## 2022-10-24 NOTE — Telephone Encounter (Signed)
Attempted to contact patient. Unable to leave VM d/t mailbox is full.

## 2022-10-25 ENCOUNTER — Encounter: Payer: Self-pay | Admitting: Internal Medicine

## 2022-10-25 ENCOUNTER — Ambulatory Visit: Payer: 59 | Attending: Internal Medicine | Admitting: Internal Medicine

## 2022-10-25 NOTE — Telephone Encounter (Signed)
Pt ws a no show today with what appears to be intermittent complete heart blcok with difficult to know for sure, but PP prolongation certainly precedes and may accompany AV block suggestive of ( but not certtainly of ) hypervagatonia

## 2022-10-25 NOTE — Progress Notes (Deleted)
Patient ID: Courtney Gardner, female   DOB: 1951-02-06, 72 y.o.   MRN: [id number]      Patient Care Team: Joice Lofts, NP as PCP - General (Family Medicine) Revankar, Aundra Dubin, MD as PCP - Cardiology (Cardiology) Duke Salvia, MD as PCP - Electrophysiology (Cardiology)   HPI                        Courtney Gardner is a 72 y.o. female seen In follow-up for   syncope  for which she underwent loop recorder implantation. it was thought to be neurally mediated, although it occurred in the context of coronary artery disease with prior bypass and normal LV function.  History of atrial fibrillation flutter on amiodarone.  Also on anticoagulation as she had a pulmonary embolism with >>.  No chest pain.  No edema.  No nocturnal dyspnea.  Does have shortness of breath and weakness with prolonged walking.  Has had events on her device consistent with neurally mediated bradycardia    Date   Cr              K Hgb TSH LFTs LDL  10/21 1.32 4.1  12.0     7/22  1.41 3.8   13.2 1.71  104  1/23 1.30 3.9 13.9 2.3 (5/23) 30     DATE TEST EF%   07/19 Echo  60-65 %   7/19 LHC  55-65 %   4/21 MyoView 44%   5/21 Echo 50-55%   6/21 LHC 60-65%   6/12 Echo 60-65%   7/22 Echo 50-55%    Thromboembolic risk factors ( age -43, HTN-1, DM-1, Vasc disease -1, Gender-1) for a CHADSVASc Score of >=5   Past Medical History:  Diagnosis Date   Abdominal bruit 12/12/2019   AKI (acute kidney injury) (HCC) 10/17/2019   Angina pectoris (HCC) 11/20/2017   Barrett's esophagus 09/30/2019   Cancer (HCC)    Coronary artery disease involving native coronary artery of native heart 12/26/2017   Diabetes mellitus due to underlying condition with unspecified complications (HCC) 10/16/2019   Type 2, on metformin   Dysrhythmia    Essential hypertension 11/20/2017   Ex-smoker 11/20/2017   Glaucoma 09/30/2019   Headache 11/10/2020   History of loop recorder 08/28/2021   Hyperlipidemia 11/20/2017   Hyperlipidemia  associated with type 2 diabetes mellitus (HCC) 10/2017   Hypertension associated with diabetes (HCC) 11/20/2017   Hypothyroidism 10/17/2019   Iliotibial band syndrome of left side 09/09/2015   Lacunar infarction (HCC) 02/19/2020   Mixed hyperlipidemia 02/18/2014   Osteoporosis without current pathological fracture 07/04/2021   Palpitations 08/12/2019   Paroxysmal atrial fibrillation (HCC) 10/17/2019   Pleural effusion on left 10/17/2019   Pulmonary embolism (HCC) 11/10/2020   Renal calculi 09/30/2019   Skin cancer of arm, left    Syncope 02/19/2020   Vitamin D deficiency, unspecified 07/09/2019    Past Surgical History:  Procedure Laterality Date   CHOLECYSTECTOMY     CORONARY ARTERY BYPASS GRAFT N/A 10/02/2019   Procedure: CORONARY ARTERY BYPASS GRAFTING (CABG) TIMES TWO, ON PUMP, USING LEFT INTERNAL MAMMARY ARTERY AND RIGHT GREATER SAPHENOUS VEIN HARVESTED ENDOSCOPICALLY;  Surgeon: Corliss Skains, MD;  Location: [facility] OR;  Service: Open Heart Surgery;  Laterality: N/A;  LIMA to LAD, SVG to OM 1   CORONARY PRESSURE/FFR STUDY N/A 11/23/2017   Procedure: INTRAVASCULAR PRESSURE WIRE/FFR STUDY;  Surgeon: Corky Crafts, MD;  Location: [facility] INVASIVE CV LAB;  Service: Cardiovascular;  Laterality: N/A;   IR THORACENTESIS ASP PLEURAL SPACE W/IMG GUIDE  12/04/2019   LEFT HEART CATH AND CORONARY ANGIOGRAPHY N/A 11/23/2017   Procedure: LEFT HEART CATH AND CORONARY ANGIOGRAPHY;  Surgeon: Corky Crafts, MD;  Location: [facility] INVASIVE CV LAB;  Service: Cardiovascular;  Laterality: N/A;   LEFT HEART CATH AND CORONARY ANGIOGRAPHY N/A 09/30/2019   Procedure: LEFT HEART CATH AND CORONARY ANGIOGRAPHY;  Surgeon: Lennette Bihari, MD;  Location: [facility] INVASIVE CV LAB;  Service: Cardiovascular;  Laterality: N/A;   MEDIASTINAL EXPLORATION N/A 10/03/2019   Procedure: MEDIASTINAL WASHOUT;  Surgeon: Corliss Skains, MD;  Location: [facility] OR;  Service: Thoracic;  Laterality: N/A;   TEE WITHOUT CARDIOVERSION N/A  10/02/2019   Procedure: TRANSESOPHAGEAL ECHOCARDIOGRAM (TEE);  Surgeon: Corliss Skains, MD;  Location: [facility] OR;  Service: Open Heart Surgery;  Laterality: N/A;   TUBAL LIGATION      No outpatient medications have been marked as taking for the 10/25/22 encounter (Appointment) with Duke Salvia, MD.    No Known Allergies  Review of Systems negative except from HPI and PMH  Physical Exam: There were no vitals taken for this visit. 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  Will continue at 100 mg daily.  Will check amiodarone surveillance laboratories.  No interval syncope but recurrent presyncope triggered by exertion associated with pallor and weakness.  Will need to exclude a Bezold-Jarisch reflex from inferior ischemia.  Undertake CTA.  In this regard also her LDL is not at target.  Last measurements we have are 18 months ago with 104.  Will recheck a lipid profile today.  And for now we will continue ezetimibe and atorvastatin   Blood pressure is well-controlled on her Apresoline 50 every 8 Zestril 10.

## 2022-10-25 NOTE — Telephone Encounter (Signed)
Patient was awake during this time and outside working in the yard. Denies any symptoms. Advised to call if she has any symptoms in the future to please call the device clinic. Patient voiced understanding.

## 2022-10-26 NOTE — Telephone Encounter (Signed)
This patient was no show with Dr. Graciela Husbands and needs appt. Sooner than later.

## 2022-10-26 NOTE — Telephone Encounter (Signed)
Spoke with pt and advised of loop recorder findings regarding pauses.  Pt reports she goes to bed at 10pm and wakes between 7-730am.  Pt does not recall any symptoms of dizziness or feeling faint. Pt advised will have scheduler contact her tomorrow to set up appointment with Dr Graciela Husbands.  Pt thanked Rn for the call.

## 2022-11-06 ENCOUNTER — Encounter: Payer: Self-pay | Admitting: Internal Medicine

## 2022-11-06 ENCOUNTER — Ambulatory Visit: Payer: 59 | Attending: Internal Medicine | Admitting: Internal Medicine

## 2022-11-06 VITALS — BP 162/78 | HR 55 | Ht 66.0 in | Wt 136.6 lb

## 2022-11-06 DIAGNOSIS — I480 Paroxysmal atrial fibrillation: Secondary | ICD-10-CM | POA: Diagnosis not present

## 2022-11-06 DIAGNOSIS — R55 Syncope and collapse: Secondary | ICD-10-CM | POA: Diagnosis not present

## 2022-11-06 NOTE — Patient Instructions (Signed)
Medication Instructions:  Your physician recommends that you continue on your current medications as directed. Please refer to the Current Medication list given to you today.  *If you need a refill on your cardiac medications before your next appointment, please call your pharmacy*   Lab Work: None ordered   Testing/Procedures: None ordered   Follow-Up: At [facility], you and your health needs are our priority.  As part of our continuing mission to provide you with exceptional heart care, we have created designated Provider Care Teams.  These Care Teams include your primary Cardiologist (physician) and Advanced Practice Providers (APPs -  Physician Assistants and Nurse Practitioners) who all work together to provide you with the care you need, when you need it.  Remote monitoring is used to monitor your Pacemaker or ICD from home. This monitoring reduces the number of office visits required to check your device to one time per year. It allows [place] to keep an eye on the functioning of your device to ensure it is working properly. You are scheduled for a device check from home on 11/20/2021. You may send your transmission at any time that day. If you have a wireless device, the transmission will be sent automatically. After your physician reviews your transmission, you will receive a postcard with your next transmission date.  Your next appointment:   6 months  The format for your next appointment:   In Person  Provider:   Sherryl Manges, MD    Thank you for choosing [facility]!!   Dory Horn, RN [phone]

## 2022-11-06 NOTE — Progress Notes (Signed)
Patient ID: Courtney Gardner, female   DOB: 30-May-1950, 72 y.o.   MRN: [id number]      Patient Care Team: Joice Lofts, NP as PCP - General (Family Medicine) Revankar, Aundra Dubin, MD as PCP - Cardiology (Cardiology) Duke Salvia, MD as PCP - Electrophysiology (Cardiology)   HPI                        Courtney Gardner is a 72 y.o. female seen In follow-up for  syncope  for which she underwent loop recorder implantation. it was thought to be neurally mediated, although it occurred in the context of coronary artery disease with prior bypass and normal LV function.  History of atrial fibrillation flutter on amiodarone.  Also  pulmonary embolism with >> anticoagulation w Apixaban    The patient denies chest pain, shortness of breath, nocturnal dyspnea, orthopnea or peripheral edema.  There have been no palpitations, lightheadedness or syncope.   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  Stress testing was recommended to the patient and she declined.  Date   Cr              K Hgb TSH LFTs LDL  10/21 1.32 4.1  12.0     7/22  1.41 3.8   13.2 1.71  104  1/23 1.30 3.9 13.9 2.3 (5/23) 30    5/24 1.19 4.4  0.173 (2/24-dose reduced) 22 99   DATE TEST EF%   07/19 Echo  60-65 %   7/19 LHC  55-65 %   4/21 MyoView 44%   5/21 Echo 50-55%   6/21 LHC 60-65%   6/12 Echo 60-65%   7/22 Echo 50-55%   1/24 CTA  CaScore 1332   Thromboembolic risk factors ( age -48, HTN-1, DM-1, Vasc disease -1, Gender-1) for a CHADSVASc Score of >=5   Past Medical History:  Diagnosis Date   Abdominal bruit 12/12/2019   AKI (acute kidney injury) (HCC) 10/17/2019   Angina pectoris (HCC) 11/20/2017   Barrett's esophagus 09/30/2019   Cancer (HCC)    Coronary artery disease involving native coronary artery of native heart 12/26/2017   Diabetes mellitus due to underlying condition  with unspecified complications (HCC) 10/16/2019   Type 2, on metformin   Dysrhythmia    Essential hypertension 11/20/2017   Ex-smoker 11/20/2017   Glaucoma 09/30/2019   Headache 11/10/2020   History of loop recorder 08/28/2021   Hyperlipidemia 11/20/2017   Hyperlipidemia associated with type 2 diabetes mellitus (HCC) 10/2017   Hypertension associated with diabetes (HCC) 11/20/2017   Hypothyroidism 10/17/2019   Iliotibial band syndrome of left side 09/09/2015   Lacunar infarction (HCC) 02/19/2020   Mixed hyperlipidemia 02/18/2014   Osteoporosis without current pathological fracture 07/04/2021   Palpitations 08/12/2019   Paroxysmal atrial fibrillation (HCC) 10/17/2019   Pleural effusion on left 10/17/2019   Pulmonary embolism (HCC) 11/10/2020   Renal calculi 09/30/2019   Skin cancer of arm, left    Syncope 02/19/2020   Vitamin D deficiency, unspecified 07/09/2019    Past Surgical History:  Procedure Laterality Date   CHOLECYSTECTOMY     CORONARY ARTERY BYPASS GRAFT N/A 10/02/2019   Procedure: CORONARY ARTERY BYPASS GRAFTING (CABG) TIMES TWO, ON PUMP, USING LEFT INTERNAL MAMMARY ARTERY AND RIGHT GREATER SAPHENOUS VEIN HARVESTED ENDOSCOPICALLY;  Surgeon: Corliss Skains, MD;  Location: [facility] OR;  Service: Open Heart Surgery;  Laterality: N/A;  LIMA to LAD, SVG to OM 1   CORONARY PRESSURE/FFR STUDY N/A 11/23/2017   Procedure: INTRAVASCULAR PRESSURE WIRE/FFR STUDY;  Surgeon: Corky Crafts, MD;  Location: [facility] INVASIVE CV LAB;  Service: Cardiovascular;  Laterality: N/A;   IR THORACENTESIS ASP PLEURAL SPACE W/IMG GUIDE  12/04/2019   LEFT HEART CATH AND CORONARY ANGIOGRAPHY N/A 11/23/2017   Procedure: LEFT HEART CATH AND CORONARY ANGIOGRAPHY;  Surgeon: Corky Crafts, MD;  Location: [facility] INVASIVE CV LAB;  Service: Cardiovascular;  Laterality: N/A;   LEFT HEART CATH AND CORONARY ANGIOGRAPHY N/A 09/30/2019   Procedure: LEFT HEART CATH AND CORONARY ANGIOGRAPHY;  Surgeon: Lennette Bihari, MD;   Location: [facility] INVASIVE CV LAB;  Service: Cardiovascular;  Laterality: N/A;   MEDIASTINAL EXPLORATION N/A 10/03/2019   Procedure: MEDIASTINAL WASHOUT;  Surgeon: Corliss Skains, MD;  Location: [facility] OR;  Service: Thoracic;  Laterality: N/A;   TEE WITHOUT CARDIOVERSION N/A 10/02/2019   Procedure: TRANSESOPHAGEAL ECHOCARDIOGRAM (TEE);  Surgeon: Corliss Skains, MD;  Location: [facility] OR;  Service: Open Heart Surgery;  Laterality: N/A;   TUBAL LIGATION      Current Meds  Medication Sig   amiodarone (PACERONE) 200 MG tablet Take 0.5 tablets (100 mg total) by mouth daily.   amLODipine (NORVASC) 10 MG tablet Take 1 tablet by mouth once daily   apixaban (ELIQUIS) 5 MG TABS tablet Take 1 tablet by mouth twice daily   atorvastatin (LIPITOR) 80 MG tablet Take 80 mg by mouth daily.   ezetimibe (ZETIA) 10 MG tablet Take 1 tablet (10 mg total) by mouth daily.   hydrALAZINE (APRESOLINE) 50 MG tablet Take 1 tablet (50 mg total) by mouth every 8 (eight) hours.   levothyroxine (SYNTHROID, LEVOTHROID) 50 MCG tablet Take 50 mcg by mouth daily before breakfast.    lisinopril (ZESTRIL) 10 MG tablet Take 10 mg by mouth daily.   metFORMIN (GLUCOPHAGE) 500 MG tablet Take 500 mg by mouth daily with breakfast.    metoprolol tartrate (LOPRESSOR) 50 MG tablet Take 1 tablet (50 mg total) by mouth as directed. Take 1 tablet 2 hours before your CT scan   nitroGLYCERIN (NITROSTAT) 0.4 MG SL tablet Place 1 tablet (0.4 mg total) under the tongue every 5 (five) minutes as needed for chest pain.   Nutritional Supplements (BOOST HIGH PROTEIN PO) Take 1 Can by mouth in the morning and at bedtime.   omeprazole (PRILOSEC) 40 MG capsule Take 1 capsule by mouth once daily   PARoxetine (PAXIL) 10 MG tablet Take 10 mg by mouth daily.   Semaglutide,0.25 or 0.5MG /DOS, 2 MG/1.5ML SOPN Inject into the skin.    No Known Allergies  Review of Systems negative except from HPI and PMH  Physical Exam: BP (!) 162/78   Pulse (!) 55   Ht 5'  6" (1.676 m)   Wt 136 lb 9.6 oz (62 kg)   SpO2 99%   BMI 22.05 kg/m  Well developed and well nourished in no acute distress HENT normal Neck supple with JVP-flat Clear Device pocket well healed; without hematoma or erythema.  There is no tethering  Regular rate and rhythm, no  gallop  murmur Abd-soft with active BS No Clubbing cyanosis   edema Skin-warm and dry A & Oriented  Grossly normal sensory and motor function  ECG sinus at 53 Interval 17/10/49  Device function is normal. Programming changes none   See Paceart for details    Pause episode during waking hours  0830 hrs. 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  Will continue at 100 mg daily her last TSH was low, her Synthroid dose was decreased, follow-up thyroid labs are pending with her primary care physician.  Will ask them also to check a CBC on her Eliquis.  Will continue the Eliquis for atrial fibrillation.  No interval syncope.  The event recorder demonstrated a pause preceded by PP prolongation suggesting of a hyper vagotonic mechanism consistent with the presumed mechanism of syncope initially identified.  Will continue to follow.  No symptoms of ischemia.  LDL is not at goal.  I have reached out to her primary cardiologist about consideration for PCSK9

## 2022-11-07 NOTE — Progress Notes (Signed)
[facility] Summary Report / Loop Recorder 

## 2022-11-14 ENCOUNTER — Encounter: Payer: Self-pay | Admitting: Internal Medicine

## 2022-11-21 ENCOUNTER — Ambulatory Visit (INDEPENDENT_AMBULATORY_CARE_PROVIDER_SITE_OTHER): Payer: 59

## 2022-11-21 DIAGNOSIS — R55 Syncope and collapse: Secondary | ICD-10-CM | POA: Diagnosis not present

## 2022-11-23 ENCOUNTER — Telehealth: Payer: Self-pay

## 2022-11-23 ENCOUNTER — Other Ambulatory Visit: Payer: Self-pay | Admitting: Cardiology

## 2022-11-23 LAB — CUP PACEART REMOTE DEVICE CHECK

## 2022-11-23 NOTE — Telephone Encounter (Signed)
Open in error

## 2022-12-07 NOTE — Progress Notes (Signed)
[facility] Summary Report / Loop Recorder 

## 2022-12-12 ENCOUNTER — Telehealth: Payer: Self-pay | Admitting: Internal Medicine

## 2022-12-12 NOTE — Telephone Encounter (Signed)
Called patient back regarding episode of lightheadedness earlier today. Patient with hx of syncope (2022) states she was working outside when she started to feel nauseated, clammy, lightheaded. She went inside and layed down on her bed, she states she felt slightly SOB from the exertion of trying to get back inside. She states that she has had a poor appetite for 3 days and diarrhea for 4. I had her check her BS which was 73, patient states this is low for her. She has an ozempic injection due Thursday, which she takes for her diabetes. Patient states she has had a recent 12 lb weight loss while she has been on ozempic. Patient states her HR is 64 but unfortunately her cuff is broken at this time. Patient denies any symptoms at this time.   Advised patient to call her PCP Claybon Jabs NP tomorrow and advise of GI illness and diarrhea. Made DOD appt for Friday 12/15/22 and asked patient to increase intake/water while tracking BP over the next few days. Asked patient to call back if she notices low pressures before her appt on Friday. Also advised patient to go to ED immediately if she does pass out, patient advises she has someone staying with her to ensure she is safe.

## 2022-12-12 NOTE — Telephone Encounter (Signed)
.  [facility]   Pt c/o Syncope: STAT if syncope occurred within 24 hours and pt complains of lightheadedness.   High Priority if episode of passing out, completely, today or in last 24 hours   1. Did you pass out today?   No   2. When is the last time you passed out?    3. Has this occurred multiple times?  Had an episode at around 2pm today  4. Did you have any symptoms prior to passing out?   Nauseous, diarrhea for 4 days, felt crappy   5. Did you fall? If so, are you on a blood thinner?   Patient stated she did not fall.  Patient states she does not feel well and wants a call back to discuss next steps.

## 2022-12-13 ENCOUNTER — Emergency Department (HOSPITAL_COMMUNITY): Payer: 59

## 2022-12-13 ENCOUNTER — Other Ambulatory Visit: Payer: Self-pay

## 2022-12-13 ENCOUNTER — Encounter (HOSPITAL_COMMUNITY): Payer: Self-pay

## 2022-12-13 ENCOUNTER — Telehealth: Payer: Self-pay

## 2022-12-13 ENCOUNTER — Emergency Department (HOSPITAL_COMMUNITY)
Admission: EM | Admit: 2022-12-13 | Discharge: 2022-12-14 | Disposition: A | Discharge status: Home / Self Care | Payer: 59 | Attending: Emergency Medicine | Admitting: Emergency Medicine

## 2022-12-13 DIAGNOSIS — Z7984 Long term (current) use of oral hypoglycemic drugs: Secondary | ICD-10-CM | POA: Diagnosis not present

## 2022-12-13 DIAGNOSIS — Z7901 Long term (current) use of anticoagulants: Secondary | ICD-10-CM | POA: Diagnosis not present

## 2022-12-13 DIAGNOSIS — R001 Bradycardia, unspecified: Secondary | ICD-10-CM | POA: Insufficient documentation

## 2022-12-13 DIAGNOSIS — Z79899 Other long term (current) drug therapy: Secondary | ICD-10-CM | POA: Diagnosis not present

## 2022-12-13 DIAGNOSIS — Z1152 Encounter for screening for COVID-19: Secondary | ICD-10-CM | POA: Diagnosis not present

## 2022-12-13 DIAGNOSIS — R110 Nausea: Secondary | ICD-10-CM | POA: Diagnosis not present

## 2022-12-13 DIAGNOSIS — R197 Diarrhea, unspecified: Secondary | ICD-10-CM | POA: Insufficient documentation

## 2022-12-13 DIAGNOSIS — I4891 Unspecified atrial fibrillation: Secondary | ICD-10-CM | POA: Diagnosis not present

## 2022-12-13 DIAGNOSIS — E119 Type 2 diabetes mellitus without complications: Secondary | ICD-10-CM | POA: Insufficient documentation

## 2022-12-13 DIAGNOSIS — R000 Tachycardia, unspecified: Secondary | ICD-10-CM | POA: Insufficient documentation

## 2022-12-13 DIAGNOSIS — I2510 Atherosclerotic heart disease of native coronary artery without angina pectoris: Secondary | ICD-10-CM | POA: Diagnosis not present

## 2022-12-13 DIAGNOSIS — R42 Dizziness and giddiness: Secondary | ICD-10-CM | POA: Insufficient documentation

## 2022-12-13 DIAGNOSIS — I10 Essential (primary) hypertension: Secondary | ICD-10-CM | POA: Insufficient documentation

## 2022-12-13 DIAGNOSIS — R002 Palpitations: Secondary | ICD-10-CM | POA: Insufficient documentation

## 2022-12-13 LAB — BASIC METABOLIC PANEL
Anion gap: 10 (ref 5–15)
BUN: 12 mg/dL (ref 8–23)
CO2: 24 mmol/L (ref 22–32)
Calcium: 9.1 mg/dL (ref 8.9–10.3)
Chloride: 100 mmol/L (ref 98–111)
Creatinine, Ser: 1.37 mg/dL — ABNORMAL HIGH (ref 0.44–1.00)
GFR, Estimated: 41 mL/min — ABNORMAL LOW (ref >60)
Glucose, Bld: 106 mg/dL — ABNORMAL HIGH (ref 70–99)
Potassium: 3.9 mmol/L (ref 3.5–5.1)
Sodium: 134 mmol/L — ABNORMAL LOW (ref 135–145)

## 2022-12-13 LAB — CBC
HCT: 34.5 % — ABNORMAL LOW (ref 36.0–46.0)
Hemoglobin: 11.5 g/dL — ABNORMAL LOW (ref 12.0–15.0)
MCH: 31.6 pg (ref 26.0–34.0)
MCHC: 33.3 g/dL (ref 30.0–36.0)
MCV: 94.8 fL (ref 80.0–100.0)
Platelets: 280 10*3/uL (ref 150–400)
RBC: 3.64 MIL/uL — ABNORMAL LOW (ref 3.87–5.11)
RDW: 13.2 % (ref 11.5–15.5)
WBC: 6.2 10*3/uL (ref 4.0–10.5)
nRBC: 0 % (ref 0.0–0.2)

## 2022-12-13 LAB — TROPONIN I (HIGH SENSITIVITY)
Troponin I (High Sensitivity): 13 ng/L (ref <18)
Troponin I (High Sensitivity): 14 ng/L (ref <18)

## 2022-12-13 NOTE — Telephone Encounter (Signed)
   Presenting ECG today at 0820AM is about  Sinus Tach/ SVT  @150bpm .

## 2022-12-13 NOTE — Progress Notes (Signed)
Cardiology Office Note:   Date:  12/15/2022  ID:  Courtney Gardner, DOB July 31, 1950, MRN [id number] PCP:  Joice Lofts, NP  [facility] Providers Primary cardiologist: Belva Crome Electrophysiologist: Sherryl Manges DOD cardiologist:  Alverda Skeans, MD Referring MD: Joice Lofts, NP   Chief Complaint/Reason for Referral: Lightheadedness EOD appointment ASSESSMENT:    1. Dizziness   2. S/P CABG x 2   3. Type 2 diabetes mellitus with complication, without long-term current use of insulin (HCC)   4. Hypertension associated with diabetes (HCC)   5. Hyperlipidemia associated with type 2 diabetes mellitus (HCC)   6. Stage 3a chronic kidney disease (HCC)   7. Atrial flutter, unspecified type (HCC)     PLAN:   In order of problems listed above: 1.  Dizziness: We interrogated her loop monitor and this demonstrated no atrial fibrillation, tacky arrhythmias, or bradycardia arrhythmias.  We will obtain an echocardiogram to evaluate further.  Given the circumstances this is highly suggestive of vagally mediated episodes.  The patient does have a history of Barrett's esophagus and is supposed to see gastroenterology in the near future. 2.  Status post CABG: Continue apixaban and atorvastatin. 3.  Type 2 diabetes: Continue statin, lisinopril, Ozempic, and start Jardiance 10 mg daily. 4.  Hypertension: BP is controlled on her current regimen. 5.  Hyperlipidemia: Lipid panel in May was at goal with an LDL 47 6.  Stage III Chronic kidney disease: Continue lisinopril and start Jardiance. 7.  Atrial flutter: Continue apixaban and amiodarone.             Dispo: As previously scheduled     Medication Adjustments/Labs and Tests Ordered: Current medicines are reviewed at length with the patient today.  Concerns regarding medicines are outlined above.  The following changes have been made:  no change   Labs/tests ordered: Orders Placed This Encounter  Procedures   ECHOCARDIOGRAM  COMPLETE    Medication Changes: No orders of the defined types were placed in this encounter.   Current medicines are reviewed at length with the patient today.  The patient does not have concerns regarding medicines.  History of Present Illness:   FOCUSED PROBLEM LIST:   Coronary artery disease status post LIMA to LAD and vein graft to obtuse marginal 2021 Type 2 diabetes on oral medication Hypertension Hyperlipidemia CKD stage III Atrial flutter followed by EP; on apixaban; loop monitor in place Pulmonary embolism BMI 22  The patient is a 72 y.o. female with the indicated medical history here for DOD appointment due to lightheadedness.  The patient contacted our office yesterday due to an episode of lightheadedness.  Currently she was outside when she started feeling nauseated and clammy as well as lightheaded.  She laid down on her bed and did feel short of breath when trying to get into bed.  She apparently had a poor appetite for few days as well as diarrhea.  She did check her blood sugar which was 87 which is low for her.  She was instructed to follow-up here for an expedited DOD appointment.  She had previously been seen by Dr. Graciela Husbands for syncope.  She underwent loop recorder implantation.  She was seen by him in early July and was doing well.  Her monitor was interrogated in late July which showed no atrial fibrillation, tachyarrhythmias, or bradycardia arrhythmias.  The patient tells me that she has had 6 days of diarrhea associated with nausea.  She feels flushed and this will bring on  a lightheadedness spell.  She has had occasional diarrhea over the past several years may be once every 2 to 3 months and this too is associated with nausea, flushing, and lightheadedness spells.  Because of the symptoms and a fast heart rate she was directed to the emergency department.  She had an evaluation there including a reassuring EKG, troponins, and blood work.  A head CT demonstrated no  stroke but did show vascular calcifications.  She denies any chest pain, shortness of breath, or severe bleeding while on Eliquis.       Previous Medical History: Past Medical History:  Diagnosis Date   Abdominal bruit 12/12/2019   AKI (acute kidney injury) (HCC) 10/17/2019   Angina pectoris (HCC) 11/20/2017   Barrett's esophagus 09/30/2019   Cancer (HCC)    Coronary artery disease involving native coronary artery of native heart 12/26/2017   Diabetes mellitus due to underlying condition with unspecified complications (HCC) 10/16/2019   Type 2, on metformin   Dysrhythmia    Essential hypertension 11/20/2017   Ex-smoker 11/20/2017   Glaucoma 09/30/2019   Headache 11/10/2020   History of loop recorder 08/28/2021   Hyperlipidemia 11/20/2017   Hyperlipidemia associated with type 2 diabetes mellitus (HCC) 10/2017   Hypertension associated with diabetes (HCC) 11/20/2017   Hypothyroidism 10/17/2019   Iliotibial band syndrome of left side 09/09/2015   Lacunar infarction (HCC) 02/19/2020   Mixed hyperlipidemia 02/18/2014   Osteoporosis without current pathological fracture 07/04/2021   Palpitations 08/12/2019   Paroxysmal atrial fibrillation (HCC) 10/17/2019   Pleural effusion on left 10/17/2019   Pulmonary embolism (HCC) 11/10/2020   Renal calculi 09/30/2019   Skin cancer of arm, left    Syncope 02/19/2020   Vitamin D deficiency, unspecified 07/09/2019     Current Medications: Current Meds  Medication Sig   amiodarone (PACERONE) 200 MG tablet Take 0.5 tablets (100 mg total) by mouth daily.   amLODipine (NORVASC) 10 MG tablet Take 1 tablet by mouth once daily   apixaban (ELIQUIS) 5 MG TABS tablet Take 1 tablet by mouth twice daily   atorvastatin (LIPITOR) 80 MG tablet Take 80 mg by mouth daily.   ezetimibe (ZETIA) 10 MG tablet Take 1 tablet (10 mg total) by mouth daily.   famotidine (PEPCID) 40 MG tablet Take 1 tablet (40 mg total) by mouth daily for 10 days.   hydrALAZINE  (APRESOLINE) 50 MG tablet Take 1 tablet (50 mg total) by mouth every 8 (eight) hours.   levothyroxine (SYNTHROID, LEVOTHROID) 50 MCG tablet Take 25 mcg by mouth daily before breakfast.   lisinopril (ZESTRIL) 10 MG tablet Take 10 mg by mouth daily.   metoprolol tartrate (LOPRESSOR) 50 MG tablet Take 1 tablet (50 mg total) by mouth as directed. Take 1 tablet 2 hours before your CT scan   nitroGLYCERIN (NITROSTAT) 0.4 MG SL tablet Place 1 tablet (0.4 mg total) under the tongue every 5 (five) minutes as needed for chest pain.   omeprazole (PRILOSEC) 40 MG capsule Take 1 capsule by mouth once daily   ondansetron (ZOFRAN) 4 MG tablet Take 1 tablet (4 mg total) by mouth every 6 (six) hours.   PARoxetine (PAXIL) 10 MG tablet Take 10 mg by mouth daily.   Semaglutide,0.25 or 0.5MG /DOS, 2 MG/1.5ML SOPN Inject into the skin.     Allergies:    Patient has no known allergies.   Social History:   Social History   Tobacco Use   Smoking status: Former   Smokeless tobacco: Never  Vaping  Use   Vaping status: Never Used  Substance Use Topics   Alcohol use: Not Currently   Drug use: Never     Family Hx: Family History  Problem Relation Age of Onset   Leukemia Mother    Lung cancer Father    Hypertension Sister    Heart attack Maternal Grandmother    Heart attack Maternal Grandfather    Heart attack Maternal Uncle      Review of Systems:   Please see the history of present illness.    All other systems reviewed and are negative.     EKGs/Labs/Other Test Reviewed:   EKG: EKG performed yesterday demonstrates sinus rhythm  EKG Interpretation Date/Time:    Ventricular Rate:    PR Interval:    QRS Duration:    QT Interval:    QTC Calculation:   R Axis:      Text Interpretation:          Prior CV studies reviewed: Cardiac Studies & Procedures   CARDIAC CATHETERIZATION  CARDIAC CATHETERIZATION 09/30/2019  Narrative  Prox RCA lesion is 30% stenosed.  Ost LM to Dist LM lesion is  75% stenosed.  Mid Cx to Dist Cx lesion is 40% stenosed.  1st Mrg lesion is 65% stenosed.  Prox LAD lesion is 70% stenosed.  The left ventricular systolic function is normal.  LV end diastolic pressure is normal.  The left ventricular ejection fraction is 55-65% by visual estimate.  Significant coronary calcification involving the left main with catheter dampening with each engagement and stenosis of 75%, calcification of the proximal LAD with narrowing up to 70%; 70% stenoses in the circumflex marginal vessel with mild 40% mid stenosis and a dominant left circumflex vessel and nondominant RCA with 30% proximal stenosis.  Normal LV function with EF estimated at 60 to 65%.  LVEDP 14 mm.  Definitely hypertensive throughout the catheterization; IV nitroglycerin was started at the end of the procedure.  RECOMMENDATION: Angiograms were reviewed with colleagues who agreed with the angiographic significance of the left main.  Will admit patient to the hospital.  Titrate IV nitroglycerin. Adjunctive medical therapy for CAD.  Surgical consultation for CABG revascularization.  Aggressive lipid intervention.  Findings Coronary Findings Diagnostic  Dominance: Left  Left Main Ost LM to Dist LM lesion is 75% stenosed.  Left Anterior Descending Prox LAD lesion is 70% stenosed.  Left Circumflex Mid Cx to Dist Cx lesion is 40% stenosed.  First Obtuse Marginal Branch 1st Mrg lesion is 65% stenosed.  Right Coronary Artery Prox RCA lesion is 30% stenosed.  Intervention  No interventions have been documented.   CARDIAC CATHETERIZATION  CARDIAC CATHETERIZATION 11/23/2017  Narrative  Mid LM lesion is 30% stenosed. This was not significant by FFR.  Prox LAD lesion is 40% stenosed.  Ost 1st Mrg to 1st Mrg lesion is 25% stenosed.  Prox RCA lesion is 25% stenosed.  The left ventricular systolic function is normal.  LV end diastolic pressure is low.  The left ventricular ejection  fraction is 55-65% by visual estimate.  There is no aortic valve stenosis.  Nonobstructive coronary artery disease.  She may have a component of vasospasm as there was catheter induced spasm on both the RCA and left main.  Consider long-acting nitrates.  FFR was performed.  Initially, the FFR dropped to 0.83 but with more adenosine, the FFR increased to 0.9.  At 2 minutes 30 seconds, the FFR was 0.92.  I suspect some of the ventricularization of pressure noted  in the angiographic appearance may have been catheter induced spasm.  She does have some atherosclerosis in the left main, LAD and obtuse marginal.  Would recommend aggressive preventive therapy.  Recommend Aspirin 81mg  daily for moderate CAD.  Findings Coronary Findings Diagnostic  Dominance: Co-dominant  Left Main Mid LM lesion is 30% stenosed.  Left Anterior Descending Prox LAD lesion is 40% stenosed.  Left Circumflex  First Obtuse Marginal Branch Ost 1st Mrg to 1st Mrg lesion is 25% stenosed.  Right Coronary Artery Prox RCA lesion is 25% stenosed.  Intervention  No interventions have been documented.   STRESS TESTS  MYOCARDIAL PERFUSION IMAGING 08/15/2019   ECHOCARDIOGRAM  ECHOCARDIOGRAM COMPLETE 11/07/2020  Narrative ECHOCARDIOGRAM REPORT    Patient Name:   AUBREIGH ETZEL Mchaffie Date of Exam: 11/07/2020 Medical Rec #:  [id number]      Height:       66.0 in Accession #:    [id number]     Weight:       152.1 lb Date of Birth:  Jun 26, 1950      BSA:          1.780 m Patient Age:    70 years       BP:           125/64 mmHg Patient Gender: F              HR:           79 bpm. Exam Location:  Inpatient  Procedure: 2D Echo, Cardiac Doppler and Color Doppler  Indications:    Syncope R55  History:        Patient has prior history of Echocardiogram examinations, most recent 09/11/2019. CAD, Arrythmias:Atrial Fibrillation; Risk Factors:Hypertension, Diabetes, Dyslipidemia and Former Smoker.  Sonographer:    Celesta Gentile RCS Referring Phys: [id number] LAURA R INGOLD  IMPRESSIONS   1. Left ventricular ejection fraction, by estimation, is 50 to 55%. The left ventricle has low normal function. The left ventricle has no regional wall motion abnormalities. There is mild concentric left ventricular hypertrophy. Left ventricular diastolic parameters are consistent with Grade I diastolic dysfunction (impaired relaxation). 2. Right ventricular systolic function is normal. The right ventricular size is normal. Tricuspid regurgitation signal is inadequate for assessing PA pressure. 3. The mitral valve is normal in structure. No evidence of mitral valve regurgitation. 4. The aortic valve is tricuspid. There is mild thickening of the aortic valve. Aortic valve regurgitation is not visualized. Mild aortic valve sclerosis is present, with no evidence of aortic valve stenosis. 5. The inferior vena cava is normal in size with greater than 50% respiratory variability, suggesting right atrial pressure of 3 mmHg.  Comparison(s): No significant change from prior study. Prior images reviewed side by side.  FINDINGS Left Ventricle: Left ventricular ejection fraction, by estimation, is 50 to 55%. The left ventricle has low normal function. The left ventricle has no regional wall motion abnormalities. The left ventricular internal cavity size was normal in size. There is mild concentric left ventricular hypertrophy. Abnormal (paradoxical) septal motion consistent with post-operative status. Left ventricular diastolic parameters are consistent with Grade I diastolic dysfunction (impaired relaxation). Indeterminate filling pressures.  Right Ventricle: The right ventricular size is normal. No increase in right ventricular wall thickness. Right ventricular systolic function is normal. Tricuspid regurgitation signal is inadequate for assessing PA pressure.  Left Atrium: Left atrial size was normal in size.  Right Atrium: Right atrial size  was normal in size.  Pericardium: There is no evidence of  pericardial effusion.  Mitral Valve: The mitral valve is normal in structure. No evidence of mitral valve regurgitation.  Tricuspid Valve: The tricuspid valve is grossly normal. Tricuspid valve regurgitation is not demonstrated.  Aortic Valve: The aortic valve is tricuspid. There is mild thickening of the aortic valve. Aortic valve regurgitation is not visualized. Mild aortic valve sclerosis is present, with no evidence of aortic valve stenosis.  Pulmonic Valve: The pulmonic valve was grossly normal. Pulmonic valve regurgitation is not visualized.  Aorta: The aortic root is normal in size and structure.  Venous: The inferior vena cava is normal in size with greater than 50% respiratory variability, suggesting right atrial pressure of 3 mmHg.  IAS/Shunts: No atrial level shunt detected by color flow Doppler.   LEFT VENTRICLE PLAX 2D LVIDd:         3.70 cm  Diastology LVIDs:         2.70 cm  LV e' medial:    3.92 cm/s LV PW:         1.30 cm  LV E/e' medial:  14.2 LV IVS:        1.30 cm  LV e' lateral:   6.09 cm/s LVOT diam:     2.00 cm  LV E/e' lateral: 9.1 LV SV:         57 LV SV Index:   32 LVOT Area:     3.14 cm   RIGHT VENTRICLE RV S prime:     8.16 cm/s TAPSE (M-mode): 1.5 cm  LEFT ATRIUM             Index       RIGHT ATRIUM           Index LA diam:        3.30 cm 1.85 cm/m  RA Area:     10.90 cm LA Vol (A2C):   44.5 ml 25.00 ml/m RA Volume:   23.80 ml  13.37 ml/m LA Vol (A4C):   39.2 ml 22.02 ml/m LA Biplane Vol: 43.8 ml 24.60 ml/m AORTIC VALVE LVOT Vmax:   93.50 cm/s LVOT Vmean:  59.500 cm/s LVOT VTI:    0.182 m  AORTA Ao Root diam: 3.10 cm  MITRAL VALVE MV Area (PHT): 2.39 cm    SHUNTS MV Decel Time: 317 msec    Systemic VTI:  0.18 m MV E velocity: 55.60 cm/s  Systemic Diam: 2.00 cm MV A velocity: 89.80 cm/s MV E/A ratio:  0.62  Mihai Croitoru MD Electronically signed by Thurmon Fair  MD Signature Date/Time: 11/07/2020/3:15:30 PM    Final   TEE  ECHO INTRAOPERATIVE TEE 10/02/2019  Narrative *INTRAOPERATIVE TRANSESOPHAGEAL REPORT *    Patient Name:   SHOUA COLVER Plotts Date of Exam: 10/02/2019 Medical Rec #:  [id number]      Height:       66.0 in Accession #:    [id number]     Weight:       149.9 lb Date of Birth:  1951/02/28      BSA:          1.77 m Patient Age:    68 years       BP:           193/81 mmHg Patient Gender: F              HR:           75 bpm. Exam Location:  Inpatient  Transesophogeal exam was perform intraoperatively during surgical procedure. Patient was closely  monitored under general anesthesia during the entirety of examination.  Indications:     CABG Performing Phys: [id number] Eliezer Lofts LIGHTFOOT Diagnosing Phys: Achille Rich MD  Complications: No known complications during this procedure. POST-OP IMPRESSIONS Overall, there were no significant changes from pre-bypass.  PRE-OP FINDINGS Left Ventricle: The left ventricle has normal systolic function, with an ejection fraction of 60-65%. The cavity size was normal. There is moderately increased left ventricular wall thickness. No evidence of left ventricular regional wall motion abnormalities. LV wall thickness 10 mm. E/A was 0.74 m/s. DT 215 ms. Suggesting Stage I diastolic disease.  Right Ventricle: The right ventricle has normal systolic function. The cavity was normal. There is no increase in right ventricular wall thickness.  Left Atrium: Left atrial size was normal in size.  Right Atrium: Right atrial size was normal in size. Right atrial pressure is estimated at 10 mmHg.  Interatrial Septum: No atrial level shunt detected by color flow Doppler.  Pericardium: There is no evidence of pericardial effusion.  Mitral Valve: The mitral valve is normal in structure. Mitral valve regurgitation is not visualized by color flow Doppler.  Tricuspid Valve: The tricuspid valve was normal in  structure. Tricuspid valve regurgitation was not visualized by color flow Doppler.  Aortic Valve: The aortic valve is tricuspid Aortic valve regurgitation was not visualized by color flow Doppler. There is no evidence of aortic valve stenosis.  Pulmonic Valve: The pulmonic valve was normal in structure. Pulmonic valve regurgitation is trivial by color flow Doppler.   Aorta: The aortic root, ascending aorta and aortic arch are normal in size and structure.  +--------------+--------++ LEFT VENTRICLE         +--------------+--------++ PLAX 2D                +--------------+--------++ LVIDd:        3.33 cm  +--------------+--------++ LVIDs:        2.06 cm  +--------------+--------++ LVOT diam:    2.30 cm  +--------------+--------++ LV SV:        31 ml    +--------------+--------++ LV SV Index:  17.61    +--------------+--------++ LVOT Area:    4.15 cm +--------------+--------++                        +--------------+--------++  +------------+-----------++ AORTIC VALVE            +------------+-----------++ LVOT Vmax:  82.80 cm/s  +------------+-----------++ LVOT Vmean: 46.400 cm/s +------------+-----------++ LVOT VTI:   0.200 m     +------------+-----------++  +--------------+----------++ MITRAL VALVE             +--------------+-------+ +--------------+----------++ SHUNTS                MV Area (PHT):3.53 cm   +--------------+-------+ +--------------+----------++ Systemic VTI: 0.20 m  MV PHT:       62.35 msec +--------------+-------+ +--------------+----------++ Systemic Diam:2.30 cm MV Decel Time:215 msec   +--------------+-------+ +--------------+----------++ +--------------+----------++ MV E velocity:56.50 cm/s +--------------+----------++ MV A velocity:73.50 cm/s +--------------+----------++ MV E/A ratio: 0.77       +--------------+----------++   Achille Rich  MD Electronically signed by Achille Rich MD Signature Date/Time: 10/03/2019/7:45:30 AM    Final   [facility]  CARDIAC EVENT MONITOR 03/30/2020  Narrative Carlyn Reichert Konieczny, DOB [birth year], MRN [id number]  EVENT MONITOR REPORT:   Patient was monitored from 02/26/2020 to 03/26/2020. Indication:                    Paroxysmal atrial  fibrillation Ordering physician:  Garwin Brothers, MD Referring physician:  Garwin Brothers, MD   Baseline rhythm: Sinus  Minimum heart rate: 51 BPM.  Average heart rate: 77 BPM.  Maximal heart rate 155 BPM.  Atrial arrhythmia: None significant rare PACs  Ventricular arrhythmia: None significant  Conduction abnormality: None significant  Symptoms: None significant   Conclusion: Normal event monitoring.  Interpreting  cardiologist: Garwin Brothers, MD Date: 03/31/2020 11:54 AM   CT SCANS  CT CORONARY MORPH W/CTA COR W/SCORE 05/03/2022  Addendum 05/04/2022 12:35 PM ADDENDUM REPORT: 05/04/2022 12:32  ADDENDUM: OVER-READ INTERPRETATION  CT CHEST  The following report is an over-read performed by radiologist Dr. Lovie Chol [facility] on 05/04/2022. This over-read does not include interpretation of cardiac or coronary anatomy or pathology. The coronary calcium and coronary CT angiography. Interpretation by the cardiologist is attached. Imaging of the chest is focused on cardiac structures and excludes much of the chest on CT.  COMPARISON:  November 07, 2020  FINDINGS:  Cardiovascular: Please see dedicated report for cardiovascular details. Signs of median sternotomy for coronary revascularization.  Mediastinum/Nodes: No adenopathy or acute process in the mediastinum.  Lungs/Pleura: Chronic small LEFT-sided pleural effusion incompletely imaged, not substantially changed compared to previous imaging.  Upper Abdomen: Incidental imaging of upper abdominal contents is unremarkable to the extent  evaluated.  Musculoskeletal: No acute musculoskeletal findings.  IMPRESSION:  1. Chronic small LEFT-sided pleural effusion incompletely imaged, not substantially changed compared to previous imaging. 2. Signs of median sternotomy for coronary revascularization.   Electronically Signed By: Donzetta Kohut M.D. On: 05/04/2022 12:32  Narrative CLINICAL DATA:  18F with CAD s/p CABG in 09/2019 (LIMA-LAD, SVG-OM1) p/w syncope  EXAM: Cardiac/Coronary CTA  TECHNIQUE: The patient was scanned on a [employer].  FINDINGS: A 100 kV prospective scan was triggered in the descending thoracic aorta at 111 HU's. Axial non-contrast 3 mm slices were carried out through the heart. The data set was analyzed on a dedicated work station and scored using the Agatson method. Gantry rotation speed was 250 msecs and collimation was .6 mm. No beta blockade and 0.8 mg of sl NTG was given. The 3D data set was reconstructed in 5% intervals of the 35-75% of the R-R cycle. Phases were analyzed on a dedicated work station using MPR, MIP and VRT modes. The patient received 100 cc of contrast.  Coronary Arteries:  Normal coronary origin.  Left dominance.  RCA is a nondominant artery. Calcified plaque in proximal RCA causes 25-49% stenosis  Left main is a large artery that gives rise to LAD and LCX arteries. Mixed plaque in left main causes severe (70-99%) stenosis  LAD is a large vessel. Patent LIMA-LAD, though proximal LIMA is not visualized as field of view was not extended to clavicles. Calcified plaque in proximal LAD causes 50-69% stenosis. Distal to LIMA anastomosis, calcified plaque in mid to distal LAD causes minimal (0-24%) stenosis  LCX is a dominant artery. Patent SVG-OM1, though proximal SVG is not visualized due to limited field of view. Calcified plaque in the proximal and mid LCX causes 25-49% stenosis. Mixed plaque in OM1 causes 50-69% stenosis. No stenosis distal to SVG  anastomosis  Other findings:  Left Ventricle: Normal size  Left Atrium: Normal size  Pulmonary Veins: Normal configuration  Right Ventricle: Normal size  Right Atrium: Normal size  Cardiac valves: No calcifications  Thoracic aorta: Normal size  Pulmonary Arteries: Normal size  Systemic Veins: Normal drainage  Pericardium: Normal  thickness  IMPRESSION: 1. Coronary calcium score of 1332. This was 97th percentile for age and sex matched control.  2. Normal coronary origin with left dominance.  3. S/p CABG with LIMA-LAD and SVG-OM1. LIMA and SVG appear patent, though unable to evaluate for stenosis in proximal portion of bypass grafts as field of view was not extended superiorly to visualize proximal LIMA and SVG  4.  Mixed plaque in left main causes severe (70-99%) stenosis  5.  Moderate (50-69%) stenosis in proximal LAD and OM1  6. Distal to LIMA-LAD anastomosis, there is calcified plaque in mid to distal LAD causing minimal (0-24%) stenosis  7.  No stenosis distal to SVG-OM1 anastomosis  8. Given proximal LIMA and SVG bypass grafts were not visualized in this study, would consider alternative modality such as nuclear stress test to evaluate for ischemia.  Electronically Signed: By: Epifanio Lesches M.D. On: 05/03/2022 16:56   CT SCANS  CT CORONARY MORPH W/CTA COR W/SCORE 09/24/2019  Addendum 09/25/2019  6:44 AM ADDENDUM REPORT: 09/25/2019 06:42  CLINICAL DATA:  72 year old with angina  EXAM: Cardiac/Coronary  CTA  TECHNIQUE: The patient was scanned on a [employer].  FINDINGS: A 110 kV prospective scan was triggered in the descending thoracic aorta at 111 HU's. Axial non-contrast 3 mm slices were carried out through the heart. The data set was analyzed on a dedicated work station and scored using the Agatson method. Gantry rotation speed was 250 msecs and collimation was .6 mm. Beta blockade and 0.8 mg of sl NTG was given. The 3D  data set was reconstructed in 5% intervals of the 67-82 % of the R-R cycle. Diastolic phases were analyzed on a dedicated work station using MPR, MIP and VRT modes. The patient received 80 cc of contrast.  Aorta: Normal size. Mild aortic root atherosclerosis. No dissection.  Aortic Valve:  Trileaflet.  Mild to moderate leaflet calcifications.  Coronary Arteries:  Normal coronary origin.  Right dominance.  RCA is small non dominant artery with calcified plaque in the ostial/ proximal region 50-74%, possibly flow limiting.  Left main is a large artery that gives rise to LAD and LCX arteries. There is dense distal calcified plaque that appears 50-79%, flow limiting.  LAD is a large vessel that has scattered dense calcified plaque in the proximal and mid region with areas of 75-99% stenosis, flow limiting.  LCX is a large dominant artery that gives rise to one large OM1 branch and PDA/PLA. There is dense calcified and non calcified plaque in the proximal and mid region that is up to 75-99% stenosis (OM branch), flow limiting. Ostial region branching from left main appears flow limiting.  Other findings:  Normal pulmonary vein drainage into the left atrium (with accessory middle right pulmonary vein).  Normal left atrial appendage without a thrombus.  Normal size of the pulmonary artery.  Please see radiology report for non cardiac findings.  IMPRESSION: 1. Coronary calcium score of 772. This was 31 percentile for age and sex matched control.  2. Normal coronary origin with LEFT dominance.  3. Severe multivessel CAD with flow limitation severe stenosis in distal left main, proximal LAD and proximal circumflex and OM. Non dominant RCA has moderate ostial stenosis. Sending for FFR analysis.  4.  Aortic atherosclerosis  5.  Aortic valve sclerosis  6.  Recommend cardiac catheterization.  Donato Schultz, MD [facility]   Electronically Signed By: Donato Schultz MD On: 09/25/2019  06:42  Narrative EXAM: OVER-READ INTERPRETATION  CT CHEST  The following report is an over-read performed by radiologist Dr. Genevive Bi of [facility] on 09/24/2019. This over-read does not include interpretation of cardiac or coronary anatomy or pathology. The coronary CTA interpretation by the cardiologist is attached.  COMPARISON:  None.  FINDINGS: Limited view of the lung parenchyma demonstrates no suspicious nodularity. Airways are normal.  Limited view of the mediastinum demonstrates no adenopathy. Esophagus normal.  Limited view of the upper abdomen unremarkable.  Limited view of the skeleton and chest wall is unremarkable.  IMPRESSION: No significant extracardiac findings.  Electronically Signed: By: Genevive Bi M.D. On: 09/24/2019 20:00          Recent Labs: 04/25/2022: TSH 0.050 12/13/2022: ALT 12; BUN 12; Creatinine, Ser 1.37; Hemoglobin 11.5; Platelets 280; Potassium 3.9; Sodium 134   Lipid Panel    Component Value Date/Time   CHOL 122 04/25/2022 1137   TRIG 67 04/25/2022 1137   HDL 67 04/25/2022 1137   CHOLHDL 1.8 04/25/2022 1137   CHOLHDL 3.0 11/09/2020 0027   VLDL 20 11/09/2020 0027   LDLCALC 41 04/25/2022 1137   LDLDIRECT 46 04/25/2022 1137    Risk Assessment/Calculations:          Physical Exam:   VS:  BP (!) 124/58   Pulse 78   Ht 5\' 6"  (1.676 m)   Wt 128 lb (58.1 kg)   SpO2 96%   BMI 20.66 kg/m        Wt Readings from Last 3 Encounters:  12/15/22 128 lb (58.1 kg)  12/13/22 136 lb (61.7 kg)  11/06/22 136 lb 9.6 oz (62 kg)      GENERAL:  No apparent distress, AOx3 HEENT:  No carotid bruits, +2 carotid impulses, no scleral icterus, dry MM CAR: RRR no murmurs, gallops, rubs, or thrills RES:  Clear to auscultation bilaterally ABD:  Soft, nontender, nondistended, positive bowel sounds x 4 VASC:  +2 radial pulses, +2 carotid pulses NEURO:  CN 2-12 grossly intact; motor and sensory grossly intact PSYCH:  No  active depression or anxiety EXT:  No edema, ecchymosis, or cyanosis  Signed, Orbie Pyo, MD  12/15/2022 11:50 AM    [facility] [address] [place], [place], [place]  [address] Phone: [phone]; Fax: [phone]   Note:  This document was prepared using Dragon voice recognition software and may include unintentional dictation errors.

## 2022-12-13 NOTE — Telephone Encounter (Signed)
Attempted to get manual transmission to check heart rate, unable to get transmission, advised patient to proceed to ER as previously instructed

## 2022-12-13 NOTE — ED Triage Notes (Signed)
Pt reports she has a loop recorder on, states her doctors office called her today and told her to come to the ER because her HR was 150s. She reports she has been feeling bad since yesterday, states she broke out into a bad sweat yesterday and felt like she was going to pass out. She reports she doesn't feel bad right now, HR is 67 in triage. Denies pain.

## 2022-12-13 NOTE — Telephone Encounter (Signed)
Received loop recorder info from device team which shows HR of 150 at 8:20 AM this morning. Call to patient who states she may have been doing chores outside at that time but she doesn't remember. Patient states she feels slightly better than yesterday but states she had noticed that her heart seemed to be racing at times. Patient has not checked her HR or BP today, advised patient to go to ED as she is still having nausea/diarrhea. Patient verbalizes understanding and agrees to plan. Gave address of [place] at [facility].

## 2022-12-14 ENCOUNTER — Emergency Department (HOSPITAL_COMMUNITY): Payer: 59

## 2022-12-14 DIAGNOSIS — R000 Tachycardia, unspecified: Secondary | ICD-10-CM | POA: Diagnosis not present

## 2022-12-14 LAB — HEPATIC FUNCTION PANEL
ALT: 12 U/L (ref 0–44)
AST: 18 U/L (ref 15–41)
Albumin: 3.7 g/dL (ref 3.5–5.0)
Alkaline Phosphatase: 65 U/L (ref 38–126)
Bilirubin, Direct: 0.1 mg/dL (ref 0.0–0.2)
Indirect Bilirubin: 0.4 mg/dL (ref 0.3–0.9)
Total Bilirubin: 0.5 mg/dL (ref 0.3–1.2)
Total Protein: 6.5 g/dL (ref 6.5–8.1)

## 2022-12-14 LAB — RESP PANEL BY RT-PCR (RSV, FLU A&B, COVID)  RVPGX2
Influenza A by PCR: NEGATIVE
Influenza B by PCR: NEGATIVE
Resp Syncytial Virus by PCR: NEGATIVE
SARS Coronavirus 2 by RT PCR: NEGATIVE

## 2022-12-14 LAB — URINALYSIS, ROUTINE W REFLEX MICROSCOPIC
Bilirubin Urine: NEGATIVE
Glucose, UA: NEGATIVE mg/dL
Hgb urine dipstick: NEGATIVE
Ketones, ur: 5 mg/dL — AB
Leukocytes,Ua: NEGATIVE
Nitrite: NEGATIVE
Protein, ur: NEGATIVE mg/dL
Specific Gravity, Urine: 1.003 — ABNORMAL LOW (ref 1.005–1.030)
pH: 5 (ref 5.0–8.0)

## 2022-12-14 LAB — LIPASE, BLOOD: Lipase: 28 U/L (ref 11–51)

## 2022-12-14 MED ORDER — LORAZEPAM 1 MG PO TABS
0.5000 mg | ORAL_TABLET | Freq: Once | ORAL | Status: AC
  Administered 2022-12-14: 0.5 mg via ORAL
  Filled 2022-12-14: qty 1

## 2022-12-14 MED ORDER — HYDRALAZINE HCL 25 MG PO TABS
50.0000 mg | ORAL_TABLET | Freq: Once | ORAL | Status: AC
  Administered 2022-12-14: 50 mg via ORAL
  Filled 2022-12-14: qty 2

## 2022-12-14 MED ORDER — FAMOTIDINE 40 MG PO TABS
40.0000 mg | ORAL_TABLET | Freq: Every day | ORAL | 0 refills | Status: DC
Start: 2022-12-14 — End: 2023-11-21

## 2022-12-14 MED ORDER — HYDRALAZINE HCL 20 MG/ML IJ SOLN: 5.0000 mg | Freq: Once | INTRAMUSCULAR | Status: DC

## 2022-12-14 MED ORDER — ONDANSETRON HCL 4 MG PO TABS: 4.0000 mg | ORAL_TABLET | Freq: Four times a day (QID) | ORAL | 0 refills | Status: AC

## 2022-12-14 NOTE — Discharge Instructions (Addendum)
Lab workup and imaging are reassuring, suspect you have a viral GI bug, I recommend a bland diet, I mean you Zofran use as needed for nausea, given you Pepcid this is acid pill should help with stomach pains.    Please follow-up with your cardiologist for further evaluation  Come back to the emergency department if you develop chest pain, shortness of breath, severe abdominal pain, uncontrolled nausea, vomiting, diarrhea.

## 2022-12-14 NOTE — ED Provider Notes (Signed)
EMERGENCY DEPARTMENT AT [facility] Provider Note   CSN: [id number] Arrival date & time: 12/13/22  1747     History  Chief Complaint  Patient presents with   Tachycardia    Courtney Gardner is a 72 y.o. female.  HPI   Patient with medical history including atrial fibrillation currently on Eliquis, hypertension, CAD, diabetes, presenting with complaints of elevated heart rate.  Patient states that she was called by her cardiologist who told her that her heart rate was in the 150s and was told to come here for further evaluation.  Patient states that she has been wearing a loop recorder due to having heart palpitations.  Patient states that she is not feeling heart palpitations yesterday but states on Tuesday while she was feeding her ducks she became lightheaded dizzy, felt as if she was going to pass out, she states she went to lay down and felt better afterwards.  She denies any chest pain or shortness of breath states she did become diaphoretic and nauseous.  She states she currently has no chest pain or shortness of breath at this time, patient does state that she has had nausea and diarrhea over the last 4 days, she denies any bloody stools or dark tarry stools, states she has had 3 episodes of diarrhea daily.  Patient states that she is felt nauseous but not actually vomited, she states that she typically feels worse after eating, she currently has no abdominal pain at this time.  She denies any cough congestion fevers chills general body aches denies any recent sick contacts.  States that she has been compliant with all her medications.  Reviewed patient's chart was contacted by her cardiologist informed that her loop recorder showed that she had a heart rate in the 150s showing sinus tach/SVT, patient's had CTA in January which shows significant calcification concern for possible ischemia stress test was offered but she had declined.  Appears the patient will undergo  echocardiogram for further evaluation. Home Medications Prior to Admission medications   Medication Sig Start Date End Date Taking? Authorizing Provider  famotidine (PEPCID) 40 MG tablet Take 1 tablet (40 mg total) by mouth daily for 10 days. 12/14/22 12/24/22 Yes Carroll Sage, PA-C  ondansetron (ZOFRAN) 4 MG tablet Take 1 tablet (4 mg total) by mouth every 6 (six) hours. 12/14/22  Yes Carroll Sage, PA-C  amiodarone (PACERONE) 200 MG tablet Take 0.5 tablets (100 mg total) by mouth daily. 04/06/21   Revankar, Aundra Dubin, MD  amLODipine (NORVASC) 10 MG tablet Take 1 tablet by mouth once daily 12/13/20   Revankar, Aundra Dubin, MD  apixaban (ELIQUIS) 5 MG TABS tablet Take 1 tablet by mouth twice daily 07/31/22   Revankar, Aundra Dubin, MD  atorvastatin (LIPITOR) 80 MG tablet Take 80 mg by mouth daily. 02/06/20   [provider]  ezetimibe (ZETIA) 10 MG tablet Take 1 tablet (10 mg total) by mouth daily. 10/19/22   Revankar, Aundra Dubin, MD  hydrALAZINE (APRESOLINE) 50 MG tablet Take 1 tablet (50 mg total) by mouth every 8 (eight) hours. 10/19/22   Revankar, Aundra Dubin, MD  levothyroxine (SYNTHROID, LEVOTHROID) 50 MCG tablet Take 25 mcg by mouth daily before breakfast. 11/12/17   [provider]  lisinopril (ZESTRIL) 10 MG tablet Take 10 mg by mouth daily. 11/04/21   [provider]  metFORMIN (GLUCOPHAGE) 500 MG tablet Take 500 mg by mouth daily with breakfast.  07/21/19   [provider]  metoprolol tartrate (LOPRESSOR) 50 MG tablet Take 1 tablet (50 mg total) by mouth as directed. Take 1 tablet 2 hours before your CT scan Patient not taking: Reported on 11/06/2022 04/25/22   Duke Salvia, MD  nitroGLYCERIN (NITROSTAT) 0.4 MG SL tablet Place 1 tablet (0.4 mg total) under the tongue every 5 (five) minutes as needed for chest pain. 05/04/22   Revankar, Aundra Dubin, MD  Nutritional Supplements (BOOST HIGH PROTEIN PO) Take 1 Can by mouth in the morning and at bedtime.    [provider]  omeprazole (PRILOSEC) 40 MG capsule Take 1 capsule by mouth once daily 11/23/22   Revankar, Aundra Dubin, MD  PARoxetine (PAXIL) 10 MG tablet Take 10 mg by mouth daily. 06/29/19   [provider]  Semaglutide,0.25 or 0.5MG /DOS, 2 MG/1.5ML SOPN Inject into the skin.    [provider]      Allergies    Patient has no known allergies.    Review of Systems   Review of Systems  Constitutional:  Negative for chills and fever.  Respiratory:  Negative for shortness of breath.   Cardiovascular:  Negative for chest pain.  Gastrointestinal:  Negative for abdominal pain.  Neurological:  Negative for headaches.    Physical Exam Updated Vital Signs BP (!) 170/73   Pulse (!) 56   Temp 98.2 F (36.8 C) (Oral)   Resp 14   Ht 5\' 6"  (1.676 m)   Wt 61.7 kg   SpO2 99%   BMI 21.95 kg/m  Physical Exam Vitals and nursing note reviewed.  Constitutional:      General: She is not in acute distress.    Appearance: She is not ill-appearing.  HENT:     Head: Normocephalic and atraumatic.     Nose: No congestion.  Eyes:     Conjunctiva/sclera: Conjunctivae normal.  Cardiovascular:     Rate and Rhythm: Regular rhythm. Bradycardia present.     Pulses: Normal pulses.     Heart sounds: No murmur heard.    No friction rub. No gallop.  Pulmonary:     Effort: No respiratory distress.     Breath sounds: No wheezing, rhonchi or rales.  Abdominal:     Palpations: Abdomen is soft.     Tenderness: There is no abdominal tenderness. There is no right CVA tenderness or left CVA tenderness.  Musculoskeletal:     Right lower leg: No edema.     Left lower leg: No edema.     Comments: No unilateral leg swelling no calf tenderness no palpable cords.  Skin:    General: Skin is warm and dry.  Neurological:     Mental Status: She is alert.     GCS: GCS eye subscore is 4. GCS verbal subscore is 5. GCS motor subscore is 6.     Cranial Nerves: Cranial nerves 2-12 are intact. No cranial nerve  deficit.     Sensory: Sensation is intact.     Motor: No weakness.     Coordination: Romberg sign negative. Finger-Nose-Finger Test normal.     Comments: Cranial nerves II through XII grossly intact no difficulty with word finding following two-step commands there is no regular weakness present.  Psychiatric:        Mood and Affect: Mood normal.     ED Results / Procedures / Treatments   Labs (all labs ordered are listed, but only abnormal results are displayed) Labs Reviewed  BASIC METABOLIC PANEL - Abnormal; Notable for the following  components:      Result Value   Sodium 134 (*)    Glucose, Bld 106 (*)    Creatinine, Ser 1.37 (*)    GFR, Estimated 41 (*)    All other components within normal limits  CBC - Abnormal; Notable for the following components:   RBC 3.64 (*)    Hemoglobin 11.5 (*)    HCT 34.5 (*)    All other components within normal limits  URINALYSIS, ROUTINE W REFLEX MICROSCOPIC - Abnormal; Notable for the following components:   Color, Urine STRAW (*)    Specific Gravity, Urine 1.003 (*)    Ketones, ur 5 (*)    All other components within normal limits  RESP PANEL BY RT-PCR (RSV, FLU A&B, COVID)  RVPGX2  LIPASE, BLOOD  HEPATIC FUNCTION PANEL  TROPONIN I (HIGH SENSITIVITY)  TROPONIN I (HIGH SENSITIVITY)    EKG None  Radiology CT Head Wo Contrast  Result Date: 12/14/2022 CLINICAL DATA:  Headache, new onset (Age >= 51y) EXAM: CT HEAD WITHOUT CONTRAST TECHNIQUE: Contiguous axial images were obtained from the base of the skull through the vertex without intravenous contrast. RADIATION DOSE REDUCTION: This exam was performed according to the departmental dose-optimization program which includes automated exposure control, adjustment of the mA and/or kV according to patient size and/or use of iterative reconstruction technique. COMPARISON:  CT head 05/07/2021 FINDINGS: Brain: Cerebral ventricle sizes are concordant with the degree of cerebral volume loss. Patchy  and confluent areas of decreased attenuation are noted throughout the deep and periventricular white matter of the cerebral hemispheres bilaterally, compatible with chronic microvascular ischemic disease. Left basal ganglia chronic lacunar infarction. No evidence of large-territorial acute infarction. No parenchymal hemorrhage. No mass lesion. No extra-axial collection. No mass effect or midline shift. No hydrocephalus. Basilar cisterns are patent. Vascular: No hyperdense vessel. Atherosclerotic calcifications are present within the cavernous internal carotid arteries. Skull: No acute fracture or focal lesion. Sinuses/Orbits: Paranasal sinuses and mastoid air cells are clear. Bilateral lens replacement. Otherwise the orbits are unremarkable. Other: None. IMPRESSION: No acute intracranial abnormality. Electronically Signed   By: Tish Frederickson M.D.   On: 12/14/2022 03:25   DG Chest 2 View  Result Date: 12/13/2022 CLINICAL DATA:  Tachycardia, shortness of breath EXAM: CHEST - 2 VIEW COMPARISON:  05/07/2021 FINDINGS: Lungs are clear.  No pleural effusion or pneumothorax. The heart is normal in size. Postsurgical changes related to prior CABG. Median sternotomy.  Thoracolumbar spine is within normal limits. Cholecystectomy clips. IMPRESSION: Normal chest radiographs. Electronically Signed   By: Charline Bills M.D.   On: 12/13/2022 18:45    Procedures Procedures    Medications Ordered in ED Medications  hydrALAZINE (APRESOLINE) tablet 50 mg (50 mg Oral Given 12/14/22 0212)  LORazepam (ATIVAN) tablet 0.5 mg (0.5 mg Oral Given 12/14/22 0212)    ED Course/ Medical Decision Making/ A&P                                 Medical Decision Making Amount and/or Complexity of Data Reviewed Labs: ordered. Radiology: ordered.  Risk Prescription drug management.   This patient presents to the ED for concern of heart palpitations, this involves an extensive number of treatment options, and is a complaint  that carries with it a high risk of complications and morbidity.  The differential diagnosis includes PE, ACS, CHF, metabolic abnormality    Additional history obtained:  Additional history obtained from caregiver  at bedside External records from outside source obtained and reviewed including cardiology   Co morbidities that complicate the patient evaluation  Atrial fibrillation, CAD, diabetes  Social Determinants of Health:  Geriatric    Lab Tests:  I Ordered, and personally interpreted labs.  The pertinent results include: CBC shows normocytic anemia hemoglobin 9.5, BMP reveals sodium of 134 glucose 106 creatinine 1.37 GFR 41, negative delta troponin, hepatic panel unremarkable, UA unremarkable, respiratory panel negative   Imaging Studies ordered:  I ordered imaging studies including CT head, chest x-ray I independently visualized and interpreted imaging which showed both negative acute findings I agree with the radiologist interpretation   Cardiac Monitoring:  The patient was maintained on a cardiac monitor.  I personally viewed and interpreted the cardiac monitored which showed an underlying rhythm of: Without signs of ischemia   Medicines ordered and prescription drug management:  I ordered medication including Ativan, hydralazine I have reviewed the patients home medicines and have made adjustments as needed  Critical Interventions:  N/A   Reevaluation:  Presents with concerns of elevated heart rate, triage obtain basic lab workup imaging which I personally viewed, unremarkable, patient benign physical exam but does endorse a slight headache, her BP is elevated, she is on a anticoag concern for possible head bleed, will send down for CT head, since patient was endorsing diarrhea and nausea will obtain hepatic panel as well as lipase for further assessment.  Lab workup imaging is unremarkable, will consult cardiology for further recommendations see patient  needs inpatient workup i.e. stress test, echocardiogram, and/or catheterization.  Consultations Obtained:  I requested consultation with the cardiology fellow Kansal,  and discussed lab and imaging findings as well as pertinent plan - they recommend: Feels symptoms could be related to BP, recommend BP management and outpatient follow-up.    Test Considered:  Hospital admission-discussed with patient, patient feels comfortable going home, with close patient follow-up, I find this reasonable having no active chest pain, no increase in her troponins, no abnormality on her EKG she has close follow-up with her cardiologist within the next week.    Rule out I have low suspicion for acute STEMI and her NSTEMI is low at this time no ST elevation, patient had negative troponins.  Suspicion for unstable angina requiring hospitalization is low she is having no active chest pain at this time or within the last 48 hours.  Low suspicion for PE as patient denies pleuritic chest pain, currently on anticoag's has not missed any doses, vital signs reassuring nontachypneic nonhypoxic.  Low suspicion for AAA or aortic dissection as history is atypical, patient has low risk factors.  I doubt intracranial bleed CT head is negative.  Doubt CVA she has no focal deficit on my exam.  Doubt patient needs admitted for hypertensive emergency is no evidence of organ damage on my exam, BP is trending down with additional BP medications.low suspicion for lower lobe pneumonia as lung sounds are clear bilaterally, will defer imaging at this time.  I have low suspicion for liver or gallbladder abnormality as she has no right upper quadrant tenderness, liver enzymes, alk phos, T bili all within normal limits.  Low suspicion for pancreatitis as lipase is within normal limits.  Low suspicion for ruptured stomach ulcer as she has no peritoneal sign present on exam.  Low suspicion for bowel obstruction as abdomen is nondistended normal  bowel sounds, so passing gas and having normal bowel movements.  Low suspicion for complicated diverticulitis as she is  nontoxic-appearing, vital signs reassuring no leukocytosis present.  Low suspicion for appendicitis as she has no right lower quadrant tenderness, vital signs reassuring.  I have low suspicion for intra-abdominal infection as she has low risk factors, vital signs reassuring, no leukocytosis, will defer imaging at this time.     Dispostion and problem list  After consideration of the diagnostic results and the patients response to treatment, I feel that the patent would benefit from discharge.  Tachycardia-unclear allergy, possible sinus tach versus SVT, left patient continue with her metoprolol and have her follow-up with cardiology for further evaluation. Elevated blood pressure-suspect secondary due to viral GI symptoms, continue other BP medications, follow-up with her cardiologist. Diarrhea-likely viral nature, will recommend bland diet, provide her with antiemetics, Pepcid, father PCP as needed given her strict return precautions.            Final Clinical Impression(s) / ED Diagnoses Final diagnoses:  Palpitations    Rx / DC Orders ED Discharge Orders          Ordered    ondansetron (ZOFRAN) 4 MG tablet  Every 6 hours        12/14/22 0431    famotidine (PEPCID) 40 MG tablet  Daily        12/14/22 0431              Carroll Sage, PA-C 12/14/22 0455    Mesner, Barbara Cower, MD 12/14/22 [phone]

## 2022-12-15 ENCOUNTER — Ambulatory Visit: Payer: 59 | Admitting: Internal Medicine

## 2022-12-15 ENCOUNTER — Encounter: Payer: Self-pay | Admitting: Internal Medicine

## 2022-12-15 ENCOUNTER — Telehealth: Payer: Self-pay | Admitting: Cardiology

## 2022-12-15 VITALS — BP 124/58 | HR 78 | Ht 66.0 in | Wt 128.0 lb

## 2022-12-15 DIAGNOSIS — R42 Dizziness and giddiness: Secondary | ICD-10-CM | POA: Diagnosis not present

## 2022-12-15 DIAGNOSIS — I4892 Unspecified atrial flutter: Secondary | ICD-10-CM

## 2022-12-15 DIAGNOSIS — E118 Type 2 diabetes mellitus with unspecified complications: Secondary | ICD-10-CM | POA: Diagnosis not present

## 2022-12-15 DIAGNOSIS — I152 Hypertension secondary to endocrine disorders: Secondary | ICD-10-CM

## 2022-12-15 DIAGNOSIS — Z951 Presence of aortocoronary bypass graft: Secondary | ICD-10-CM | POA: Diagnosis not present

## 2022-12-15 DIAGNOSIS — E1159 Type 2 diabetes mellitus with other circulatory complications: Secondary | ICD-10-CM

## 2022-12-15 DIAGNOSIS — E785 Hyperlipidemia, unspecified: Secondary | ICD-10-CM

## 2022-12-15 DIAGNOSIS — E1169 Type 2 diabetes mellitus with other specified complication: Secondary | ICD-10-CM

## 2022-12-15 DIAGNOSIS — N1831 Chronic kidney disease, stage 3a: Secondary | ICD-10-CM

## 2022-12-15 NOTE — Telephone Encounter (Signed)
Just a note,  looks like atrial tach although she has had flutter in the past  Would follow for now on amiodarone

## 2022-12-15 NOTE — Telephone Encounter (Signed)
New message   Patient want to switch from Dr Tomie China to Dr Lynnette Caffey.  She want to come to start coming to the [place] office.  Is this ok?

## 2022-12-15 NOTE — Patient Instructions (Signed)
Medication Instructions:  No changes *If you need a refill on your cardiac medications before your next appointment, please call your pharmacy*   Lab Work: none If you have labs (blood work) drawn today and your tests are completely normal, you will receive your results only by: MyChart Message (if you have MyChart) OR A paper copy in the mail If you have any lab test that is abnormal or we need to change your treatment, we will call you to review the results.   Testing/Procedures: Your physician has requested that you have an echocardiogram. Echocardiography is a painless test that uses sound waves to create images of your heart. It provides your doctor with information about the size and shape of your heart and how well your heart's chambers and valves are working. This procedure takes approximately one hour. There are no restrictions for this procedure. Please do NOT wear cologne, perfume, aftershave, or lotions (deodorant is allowed). Please arrive 15 minutes prior to your appointment time.   Follow-Up 3 months with Dr. Tomie China

## 2022-12-16 ENCOUNTER — Other Ambulatory Visit: Payer: Self-pay | Admitting: Cardiology

## 2022-12-25 ENCOUNTER — Ambulatory Visit: Payer: 59

## 2022-12-25 DIAGNOSIS — I480 Paroxysmal atrial fibrillation: Secondary | ICD-10-CM | POA: Diagnosis not present

## 2022-12-25 LAB — CUP PACEART REMOTE DEVICE CHECK
Date Time Interrogation Session: 20240825231722
Implantable Pulse Generator Implant Date: 20220720

## 2023-01-03 NOTE — Progress Notes (Signed)
[facility] Summary Report / Loop Recorder 

## 2023-01-11 ENCOUNTER — Ambulatory Visit (HOSPITAL_COMMUNITY): Payer: 59 | Attending: Internal Medicine

## 2023-01-11 DIAGNOSIS — E119 Type 2 diabetes mellitus without complications: Secondary | ICD-10-CM | POA: Insufficient documentation

## 2023-01-11 DIAGNOSIS — Z951 Presence of aortocoronary bypass graft: Secondary | ICD-10-CM | POA: Insufficient documentation

## 2023-01-11 DIAGNOSIS — E785 Hyperlipidemia, unspecified: Secondary | ICD-10-CM | POA: Diagnosis not present

## 2023-01-11 DIAGNOSIS — I119 Hypertensive heart disease without heart failure: Secondary | ICD-10-CM | POA: Diagnosis not present

## 2023-01-11 DIAGNOSIS — I2510 Atherosclerotic heart disease of native coronary artery without angina pectoris: Secondary | ICD-10-CM | POA: Insufficient documentation

## 2023-01-11 DIAGNOSIS — R42 Dizziness and giddiness: Secondary | ICD-10-CM | POA: Insufficient documentation

## 2023-01-11 DIAGNOSIS — Z87891 Personal history of nicotine dependence: Secondary | ICD-10-CM | POA: Diagnosis not present

## 2023-01-11 DIAGNOSIS — I4892 Unspecified atrial flutter: Secondary | ICD-10-CM | POA: Insufficient documentation

## 2023-01-11 LAB — ECHOCARDIOGRAM COMPLETE
Area-P 1/2: 3.34 cm2
S' Lateral: 2.9 cm

## 2023-01-21 ENCOUNTER — Other Ambulatory Visit: Payer: Self-pay | Admitting: Cardiology

## 2023-01-22 NOTE — Telephone Encounter (Signed)
Prescription refill request for Eliquis received. Indication:afib Last office visit:8/24 Scr:1.18  8/24 Age: 72 Weight:58.1  kg  Prescription refilled

## 2023-01-29 ENCOUNTER — Ambulatory Visit (INDEPENDENT_AMBULATORY_CARE_PROVIDER_SITE_OTHER): Payer: 59

## 2023-01-29 DIAGNOSIS — I480 Paroxysmal atrial fibrillation: Secondary | ICD-10-CM | POA: Diagnosis not present

## 2023-01-30 LAB — CUP PACEART REMOTE DEVICE CHECK
Date Time Interrogation Session: 20240927230958
Implantable Pulse Generator Implant Date: 20220720

## 2023-02-13 NOTE — Progress Notes (Signed)
[facility] Summary Report / Loop Recorder 

## 2023-02-15 ENCOUNTER — Other Ambulatory Visit: Payer: Self-pay | Admitting: Cardiology

## 2023-02-15 NOTE — Telephone Encounter (Signed)
This is Dr. Kem Parkinson pt. Pt was seen by Dr. Lynnette Caffey because he was DOD. Please address

## 2023-02-23 ENCOUNTER — Other Ambulatory Visit: Payer: Self-pay | Admitting: Cardiology

## 2023-02-23 NOTE — Telephone Encounter (Signed)
Pt's pharmacy is requesting a refill on omeprazole. Would Dr. Lynnette Caffey like to refill this medication? Please address

## 2023-03-02 LAB — CUP PACEART REMOTE DEVICE CHECK
Date Time Interrogation Session: 20241030230608
Implantable Pulse Generator Implant Date: 20220720

## 2023-03-05 ENCOUNTER — Ambulatory Visit (INDEPENDENT_AMBULATORY_CARE_PROVIDER_SITE_OTHER): Payer: 59

## 2023-03-05 DIAGNOSIS — I480 Paroxysmal atrial fibrillation: Secondary | ICD-10-CM

## 2023-03-09 NOTE — Progress Notes (Unsigned)
Cardiology Office Note:   Date:  03/09/2023  ID:  Courtney Gardner, DOB [birth year], MRN [id number] PCP:  Orbie Pyo, MD  [facility] Providers Cardiologist:  Alverda Skeans, MD EP cardiologist: Sherryl Manges, MD Referring MD: Joice Lofts, NP  Chief Complaint/Reason for Referral: Cardiology follow-up ASSESSMENT:    1. Dizziness   2. S/P CABG x 2   3. Type 2 diabetes mellitus with complication, without long-term current use of insulin (HCC)   4. Hypertension associated with diabetes (HCC)   5. Hyperlipidemia associated with type 2 diabetes mellitus (HCC)   6. Stage 3a chronic kidney disease (HCC)   7. Atrial flutter, unspecified type (HCC)     PLAN:   In order of problems listed above: Dizziness:  Reassuring cardiovascular evaluation thus far.  Given the circumstances this is highly suggestive of vagally mediated episodes.  The patient does have a history of Barrett's esophagus and is supposed to see gastroenterology in the near future.*** Status post CABG: Continue apixaban and atorvastatin. Type 2 diabetes: Continue statin, lisinopril, Ozempic, and start Jardiance 10 mg daily. Hypertension: BP is controlled on her current regimen. Hyperlipidemia: Lipid panel in May was at goal with an LDL 47; check lipids, LFTs, and Lp(a) in 2 months. Stage III Chronic kidney disease: Continue lisinopril and start Jardiance. Atrial flutter: Continue apixaban and amiodarone.          {Are you ordering a CV Procedure (e.g. stress test, cath, DCCV, TEE, etc)?   Press F2        :[id number]}   Dispo:  No follow-ups on file.      Medication Adjustments/Labs and Tests Ordered: Current medicines are reviewed at length with the patient today.  Concerns regarding medicines are outlined above.  The following changes have been made:  {PLAN; NO CHANGE:13088:s}   Labs/tests ordered: No orders of the defined types were placed in this encounter.   Medication Changes: No orders of the defined  types were placed in this encounter.   Current medicines are reviewed at length with the patient today.  The patient {ACTIONS; HAS/DOES NOT HAVE:19233} concerns regarding medicines.  I spent *** minutes reviewing all clinical data during and prior to this visit including all relevant imaging studies, laboratories, clinical information from other health systems, and prior notes from both Cardiology and other specialties, interviewing the patient, and conducting a complete physical examination in order to formulate a comprehensive and personalized evaluation and treatment plan.  History of Present Illness:      FOCUSED PROBLEM LIST:   Coronary artery disease  LIMA to LAD and vein graft to obtuse marginal 2021 Type 2 diabetes on oral medication Hypertension Hyperlipidemia CKD stage III Atrial flutter  followed by EP; loop monitor in place on apixaban Pulmonary embolism BMI 21 December 2022:  The patient is a 72 y.o. female with the indicated medical history here for DOD appointment due to lightheadedness.  The patient contacted our office yesterday due to an episode of lightheadedness.  Currently she was outside when she started feeling nauseated and clammy as well as lightheaded.  She laid down on her bed and did feel short of breath when trying to get into bed.  She apparently had a poor appetite for few days as well as diarrhea.  She did check her blood sugar which was 87 which is low for her.  She was instructed to follow-up here for an expedited DOD appointment.   She had previously been seen by Dr. Graciela Husbands  for syncope.  She underwent loop recorder implantation.  She was seen by him in early July and was doing well.  Her monitor was interrogated in late July which showed no atrial fibrillation, tachyarrhythmias, or bradycardia arrhythmias.   The patient tells me that she has had 6 days of diarrhea associated with nausea.  She feels flushed and this will bring on a lightheadedness spell.  She  has had occasional diarrhea over the past several years may be once every 2 to 3 months and this too is associated with nausea, flushing, and lightheadedness spells.  Because of the symptoms and a fast heart rate she was directed to the emergency department.  She had an evaluation there including a reassuring EKG, troponins, and blood work.  A head CT demonstrated no stroke but did show vascular calcifications.  She denies any chest pain, shortness of breath, or severe bleeding while on Eliquis.  Plan: Obtain echocardiogram, follow-up with PCP; continue medical therapy.  November 2024: In the interim the patient had an echocardiogram which demonstrated preserved LV function with mild left ventricular hypertrophy.  Her monitor has been interrogated twice which demonstrated no significant arrhythmias.          Current Medications: No outpatient medications have been marked as taking for the 03/12/23 encounter (Appointment) with Orbie Pyo, MD.     Review of Systems:   Please see the history of present illness.    All other systems reviewed and are negative.     EKGs/Labs/Other Test Reviewed:   EKG:    EKG Interpretation Date/Time:    Ventricular Rate:    PR Interval:    QRS Duration:    QT Interval:    QTC Calculation:   R Axis:      Text Interpretation:           Risk Assessment/Calculations:   {Does this patient have ATRIAL FIBRILLATION?[phone]}      Physical Exam:   VS:  There were no vitals taken for this visit.   No BP recorded.  {Refresh Note OR Click here to enter BP  :1}***   Wt Readings from Last 3 Encounters:  12/15/22 128 lb (58.1 kg)  12/13/22 136 lb (61.7 kg)  11/06/22 136 lb 9.6 oz (62 kg)      GENERAL:  No apparent distress, AOx3 HEENT:  No carotid bruits, +2 carotid impulses, no scleral icterus CAR: RRR Irregular RR*** no murmurs***, gallops, rubs, or thrills RES:  Clear to auscultation bilaterally ABD:  Soft, nontender, nondistended,  positive bowel sounds x 4 VASC:  +2 radial pulses, +2 carotid pulses NEURO:  CN 2-12 grossly intact; motor and sensory grossly intact PSYCH:  No active depression or anxiety EXT:  No edema, ecchymosis, or cyanosis  Signed, Orbie Pyo, MD  03/09/2023 8:06 AM    [facility] [address] [place], [place], [place]  [address] Phone: [phone]; Fax: [phone]   Note:  This document was prepared using Dragon voice recognition software and may include unintentional dictation errors.

## 2023-03-12 ENCOUNTER — Ambulatory Visit: Payer: 59 | Attending: Cardiology | Admitting: Internal Medicine

## 2023-03-12 ENCOUNTER — Encounter: Payer: Self-pay | Admitting: Internal Medicine

## 2023-03-12 VITALS — BP 135/65 | HR 64 | Ht 66.0 in | Wt 128.4 lb

## 2023-03-12 DIAGNOSIS — R42 Dizziness and giddiness: Secondary | ICD-10-CM

## 2023-03-12 DIAGNOSIS — E1159 Type 2 diabetes mellitus with other circulatory complications: Secondary | ICD-10-CM

## 2023-03-12 DIAGNOSIS — Z951 Presence of aortocoronary bypass graft: Secondary | ICD-10-CM | POA: Diagnosis not present

## 2023-03-12 DIAGNOSIS — N1831 Chronic kidney disease, stage 3a: Secondary | ICD-10-CM

## 2023-03-12 DIAGNOSIS — E118 Type 2 diabetes mellitus with unspecified complications: Secondary | ICD-10-CM

## 2023-03-12 DIAGNOSIS — I152 Hypertension secondary to endocrine disorders: Secondary | ICD-10-CM

## 2023-03-12 DIAGNOSIS — E1169 Type 2 diabetes mellitus with other specified complication: Secondary | ICD-10-CM

## 2023-03-12 DIAGNOSIS — E785 Hyperlipidemia, unspecified: Secondary | ICD-10-CM

## 2023-03-12 DIAGNOSIS — I4892 Unspecified atrial flutter: Secondary | ICD-10-CM

## 2023-03-12 MED ORDER — EMPAGLIFLOZIN 10 MG PO TABS: 10.0000 mg | ORAL_TABLET | Freq: Every day | ORAL | Status: DC

## 2023-03-12 MED ORDER — EMPAGLIFLOZIN 10 MG PO TABS: 10.0000 mg | ORAL_TABLET | Freq: Every day | ORAL | 6 refills | Status: DC

## 2023-03-12 NOTE — Patient Instructions (Signed)
Medication Instructions:   START TAKING JARDIANCE 10 MG BY MOUTH DAILY BEFORE BREAKFAST  *If you need a refill on your cardiac medications before your next appointment, please call your pharmacy*   Lab Work:  TODAY--LIPIDS, LFTs, AND LIPOPROTEIN A  If you have labs (blood work) drawn today and your tests are completely normal, you will receive your results only by: MyChart Message (if you have MyChart) OR A paper copy in the mail If you have any lab test that is abnormal or we need to change your treatment, we will call you to review the results.   Follow-Up: At [facility], you and your health needs are our priority.  As part of our continuing mission to provide you with exceptional heart care, we have created designated Provider Care Teams.  These Care Teams include your primary Cardiologist (physician) and Advanced Practice Providers (APPs -  Physician Assistants and Nurse Practitioners) who all work together to provide you with the care you need, when you need it.  We recommend signing up for the patient portal called "MyChart".  Sign up information is provided on this After Visit Summary.  MyChart is used to connect with patients for Virtual Visits (Telemedicine).  Patients are able to view lab/test results, encounter notes, upcoming appointments, etc.  Non-urgent messages can be sent to your provider as well.   To learn more about what you can do with MyChart, go to [email or web address].    Your next appointment:   6 month(s)  Provider:   Jari Favre, PA-C, Ronie Spies, PA-C, Robin Searing, NP, Jacolyn Reedy, PA-C, Eligha Bridegroom, NP, Tereso Newcomer, PA-C, or Perlie Gold, PA-C

## 2023-03-21 ENCOUNTER — Ambulatory Visit: Payer: 59 | Admitting: Cardiology

## 2023-03-27 ENCOUNTER — Other Ambulatory Visit: Payer: Self-pay | Admitting: Cardiology

## 2023-04-02 NOTE — Progress Notes (Signed)
[facility] Summary Report / Loop Recorder 

## 2023-04-09 ENCOUNTER — Ambulatory Visit: Payer: 59

## 2023-04-09 DIAGNOSIS — I480 Paroxysmal atrial fibrillation: Secondary | ICD-10-CM

## 2023-04-10 LAB — CUP PACEART REMOTE DEVICE CHECK
Date Time Interrogation Session: 20241208230655
Implantable Pulse Generator Implant Date: 20220720

## 2023-04-16 ENCOUNTER — Other Ambulatory Visit: Payer: Self-pay | Admitting: Cardiology

## 2023-05-03 ENCOUNTER — Other Ambulatory Visit: Payer: Self-pay | Admitting: Cardiology

## 2023-05-14 ENCOUNTER — Ambulatory Visit (INDEPENDENT_AMBULATORY_CARE_PROVIDER_SITE_OTHER): Payer: 59

## 2023-05-14 DIAGNOSIS — I480 Paroxysmal atrial fibrillation: Secondary | ICD-10-CM

## 2023-05-14 LAB — CUP PACEART REMOTE DEVICE CHECK
Date Time Interrogation Session: 20250112230903
Implantable Pulse Generator Implant Date: 20220720

## 2023-05-17 NOTE — Progress Notes (Signed)
[facility] Summary Report / Loop Recorder 

## 2023-05-17 NOTE — Addendum Note (Signed)
Addended by: Geralyn Flash D on: 05/17/2023 03:37 PM   Modules accepted: Orders

## 2023-06-18 ENCOUNTER — Ambulatory Visit (INDEPENDENT_AMBULATORY_CARE_PROVIDER_SITE_OTHER): Payer: 59

## 2023-06-18 DIAGNOSIS — I480 Paroxysmal atrial fibrillation: Secondary | ICD-10-CM

## 2023-06-19 LAB — CUP PACEART REMOTE DEVICE CHECK
Date Time Interrogation Session: 20250216230718
Implantable Pulse Generator Implant Date: 20220720

## 2023-06-27 ENCOUNTER — Encounter: Payer: Self-pay | Admitting: Internal Medicine

## 2023-06-27 NOTE — Progress Notes (Signed)
 Carelink Summary Report / Loop Recorder

## 2023-07-13 ENCOUNTER — Other Ambulatory Visit: Payer: Self-pay | Admitting: Cardiology

## 2023-07-13 NOTE — Telephone Encounter (Signed)
 Prescription refill request for Eliquis received. Indication:afib Last office visit:11/24 Scr:1.18  8/24 Age: 73 Weight:58.2  kg  Prescription refilled

## 2023-07-17 ENCOUNTER — Encounter: Payer: Self-pay | Admitting: Internal Medicine

## 2023-07-23 ENCOUNTER — Ambulatory Visit (INDEPENDENT_AMBULATORY_CARE_PROVIDER_SITE_OTHER): Payer: 59

## 2023-07-23 DIAGNOSIS — I480 Paroxysmal atrial fibrillation: Secondary | ICD-10-CM

## 2023-07-23 LAB — CUP PACEART REMOTE DEVICE CHECK
Date Time Interrogation Session: 20250323230622
Implantable Pulse Generator Implant Date: 20220720

## 2023-07-28 ENCOUNTER — Other Ambulatory Visit: Payer: Self-pay | Admitting: Internal Medicine

## 2023-07-30 NOTE — Addendum Note (Signed)
 Addended by: Geralyn Flash D on: 07/30/2023 01:13 PM   Modules accepted: Orders

## 2023-07-30 NOTE — Progress Notes (Signed)
 Carelink Summary Report / Loop Recorder

## 2023-08-18 ENCOUNTER — Encounter: Payer: Self-pay | Admitting: Internal Medicine

## 2023-08-27 ENCOUNTER — Ambulatory Visit (INDEPENDENT_AMBULATORY_CARE_PROVIDER_SITE_OTHER): Payer: 59

## 2023-08-27 DIAGNOSIS — I480 Paroxysmal atrial fibrillation: Secondary | ICD-10-CM | POA: Diagnosis not present

## 2023-08-27 LAB — CUP PACEART REMOTE DEVICE CHECK
Date Time Interrogation Session: 20250427230710
Implantable Pulse Generator Implant Date: 20220720

## 2023-09-11 NOTE — Progress Notes (Signed)
 Carelink Summary Report / Loop Recorder

## 2023-09-27 ENCOUNTER — Ambulatory Visit

## 2023-09-27 DIAGNOSIS — R55 Syncope and collapse: Secondary | ICD-10-CM

## 2023-09-27 LAB — CUP PACEART REMOTE DEVICE CHECK
Date Time Interrogation Session: 20250528230752
Implantable Pulse Generator Implant Date: 20220720

## 2023-09-28 ENCOUNTER — Ambulatory Visit: Payer: Self-pay | Admitting: Cardiology

## 2023-10-18 NOTE — Progress Notes (Signed)
 Carelink Summary Report / Loop Recorder

## 2023-10-29 ENCOUNTER — Ambulatory Visit: Payer: Self-pay | Admitting: Cardiology

## 2023-10-29 ENCOUNTER — Ambulatory Visit

## 2023-10-29 DIAGNOSIS — I480 Paroxysmal atrial fibrillation: Secondary | ICD-10-CM

## 2023-10-29 LAB — CUP PACEART REMOTE DEVICE CHECK
Date Time Interrogation Session: 20250629232020
Implantable Pulse Generator Implant Date: 20220720

## 2023-10-31 ENCOUNTER — Other Ambulatory Visit: Payer: Self-pay | Admitting: Internal Medicine

## 2023-11-13 NOTE — Progress Notes (Deleted)
   Electrophysiology Office Note:   Date:  11/13/2023  ID:  Courtney Gardner, DOB 03-12-1951, MRN [id number]  Primary Cardiologist: Lurena MARLA Red, MD Primary Heart Failure: None Electrophysiologist: Ulus Hazen Gladis Norton, MD  {Click to update primary MD,subspecialty MD or APP then REFRESH:1}    History of Present Illness:   Courtney Gardner is a 73 y.o. female with h/o atrial fibrillation, pulmonary embolism, recurrent syncope, elevated coronary calcium  score, hypertension, type 2 diabetes seen today for {VISITTYPE:28148}  Review of systems complete and found to be negative unless listed in HPI.   Device History: Medtronic loop recorder implanted *** for syncope  EP Information / Studies Reviewed:    {EKGtoday:28818}        Risk Assessment/Calculations:    CHA2DS2-VASc Score = 4  {Confirm score is correct.  If not, click here to update score.  REFRESH note.  :1} This indicates a 4.8% annual risk of stroke. The patient's score is based upon: CHF History: 0 HTN History: 1 Diabetes History: 1 Stroke History: 0 Vascular Disease History: 0 Age Score: 1 Gender Score: 1   {This patient has a significant risk of stroke if diagnosed with atrial fibrillation.  Please consider VKA or DOAC agent for anticoagulation if the bleeding risk is acceptable.   You can also use the SmartPhrase .[email or web address] for documentation.   :[id number]}         Physical Exam:   VS:  There were no vitals taken for this visit.   Wt Readings from Last 3 Encounters:  03/12/23 128 lb 6.4 oz (58.2 kg)  12/15/22 128 lb (58.1 kg)  12/13/22 136 lb (61.7 kg)     GEN: Well nourished, well developed in no acute distress NECK: No JVD; No carotid bruits CARDIAC: {EPRHYTHM:28826}, no murmurs, rubs, gallops RESPIRATORY:  Clear to auscultation without rales, wheezing or rhonchi  ABDOMEN: Soft, non-tender, non-distended EXTREMITIES:  No edema; No deformity   ILR Interrogation- reviewed in detail today,  See PACEART  report  ASSESSMENT AND PLAN:    Syncope s/p Medtronic Loop recorder Normal device function See Pace Art report No changes today  2.  Paroxysmal atrial fibrillation/flutter: On amiodarone .***  3.  Secondary hypercoagulable state: On Eliquis   4.  Coronary artery disease: Post CABG.  Plan per primary cardiology.  5.  High-risk medication monitoring: On amiodarone .  Courtney Gardner check TSH and LFTs today. {Click here to Review PMH, Prob List, Meds, Allergies, SHx, FHx  :1}   Follow up with {EPMDS:28135::EP Team} {EPFOLLOW LE:71826}  Signed, Courtney Ikard Gladis Norton, MD

## 2023-11-14 ENCOUNTER — Ambulatory Visit: Attending: Cardiology | Admitting: Cardiology

## 2023-11-15 ENCOUNTER — Encounter: Payer: Self-pay | Admitting: Cardiology

## 2023-11-16 NOTE — Progress Notes (Signed)
 Carelink Summary Report / Loop Recorder

## 2023-11-19 NOTE — Progress Notes (Unsigned)
 Cardiology Office Note    Date:  11/21/2023  ID:  Courtney Gardner, Courtney Gardner [birth year], MRN [id number] PCP:  Wendel Lurena POUR, MD  Cardiologist:  Lurena POUR Wendel, MD  Electrophysiologist:  Soyla Gladis Norton, MD   Chief Complaint: f/u CAD  History of Present Illness: .    Courtney Gardner is a 73 y.o. female with visit-pertinent history of CAD s/p LIMA-LAD and VG-OM 2021, DM2, HTN, HLD, CKD stage 3a, syncope with h/o ILR followed by EP with atrial fib/atrial flutter diagnosed in 2022 followed by EP treated with amiodarone , prior PE, complex GI history (gastritis, Barrett's esophagus) seen for follow-up. Last ischemic eval was by coronary CTA in 05/2022 ordered by Dr. Fernande, inconclusive results given h/o CABG, recommended to consider stress testing and patient declined. Last echo 12/2022 EF 55-60%, mild LVH, G2DD, aortic sclerosis without stenosis. Started on Jardiance  at last OV 03/2023. Last ILR interrogation 09/2023 showed nocturnal bradycardia episode, no changes made at that time.  She is seen for follow-up with friend and caregiver [name]. They report an issue with the pharmacy so she hasn't been taking amiodarone  or amlodipine  for 2 months. She has been having issues with RLQ pain for which she has been seeing her PCP - had ultrasound at [facility], results not available. Occasionally has issues with vomiting after Ozempic prescribed by PCP. No CP, SOB, palpitations, edema. No recurrent episodes that feel like her prior afib/flutter. Initial BP 170/80 recheck 142/80 by me. She reports significant hx of white coat component to her BP.  Labwork independently reviewed: 11/2022 TSH OK, K 4.8, Cr 1.18, LFTs ok, LDL 66, trig 89, A1c 5.9, Hgb 11.5, plt 280  ROS: .    Please see the history of present illness.  All other systems are reviewed and otherwise negative.  Studies Reviewed: SABRA    EKG:  EKG is ordered today, personally reviewed, demonstrating: EKG Interpretation Date/Time:  Wednesday  November 21 2023 11:26:23 EDT Ventricular Rate:  57 PR Interval:  160 QRS Duration:  88 QT Interval:  460 QTC Calculation: 447 R Axis:   -16  Text Interpretation: Sinus bradycardia Cannot rule out Anterior infarct (cited on or before 13-Dec-2022) No acute changes Confirmed by Kathrynn Backstrom [phone]) on 11/21/2023 11:55:35 AM    CV Studies: Cardiac studies reviewed are outlined and summarized above. Otherwise please see EMR for full report.   Current Reported Medications:.    Current Meds  Medication Sig   apixaban  (ELIQUIS ) 5 MG TABS tablet Take 1 tablet by mouth twice daily   atorvastatin  (LIPITOR ) 80 MG tablet Take 80 mg by mouth daily.   empagliflozin  (JARDIANCE ) 10 MG TABS tablet Take 1 tablet (10 mg total) by mouth daily before breakfast.   ezetimibe  (ZETIA ) 10 MG tablet Take 1 tablet by mouth once daily   hydrALAZINE  (APRESOLINE ) 50 MG tablet TAKE 1 TABLET BY MOUTH EVERY 8 HOURS   levothyroxine  (SYNTHROID , LEVOTHROID) 50 MCG tablet Take 25 mcg by mouth daily before breakfast.   lisinopril  (ZESTRIL ) 10 MG tablet Take 10 mg by mouth daily.   nitroGLYCERIN  (NITROSTAT ) 0.4 MG SL tablet Place 1 tablet (0.4 mg total) under the tongue every 5 (five) minutes as needed for chest pain.   omeprazole  (PRILOSEC) 40 MG capsule Take 1 capsule by mouth once daily   ondansetron  (ZOFRAN ) 4 MG tablet Take 1 tablet (4 mg total) by mouth every 6 (six) hours.   PARoxetine  (PAXIL ) 10 MG tablet Take 10 mg by mouth daily.  Semaglutide,0.25 or 0.5MG /DOS, 2 MG/1.5ML SOPN Inject into the skin.    Physical Exam:    VS:  BP (!) 142/80   Pulse (!) 53   Ht 5' 6 (1.676 m)   Wt 122 lb (55.3 kg)   SpO2 97%   BMI 19.69 kg/m    Wt Readings from Last 3 Encounters:  11/21/23 122 lb (55.3 kg)  03/12/23 128 lb 6.4 oz (58.2 kg)  12/15/22 128 lb (58.1 kg)    GEN: Thin, well developed in no acute distress NECK: No JVD; No carotid bruits CARDIAC: RRR, no murmurs, rubs, gallops RESPIRATORY:  Clear to auscultation  without rales, wheezing or rhonchi  ABDOMEN: Soft, non-tender, non-distended EXTREMITIES:  No edema; No acute deformity   Asessement and Plan:.    1. CAD - no accelerating anginal symptoms. Not on ASA in setting of concomitant Eliquis  per prior notes. Continue atorvastatin  80mg  daily. Not on beta blocker in setting of baseline sinus bradycardia. Check CMET, lipids, CBC today.  2. Essential HTN - Suboptimal blood pressure control noted today. She has been off amlodipine  for a few months. She reports home BPs 125-135 systolic recently. We need a better idea of what it's running at home consistently. The patient was provided instructions on monitoring blood pressure at home for 1 week and relaying results to our office. If suboptimally controlled would consider restart of some of her amlodipine .  3. H/o syncope, nocturnal bradycardia - ILR being followed remotely by EP. She also had an episode of nocturnal bradycardia noted on her ILR monitor, no changes made by EP MD upon review. She is due for follow-up anyway with EP, will arrange. See below re: amiodarone .  4. Atrial fib/flutter - patient ran out of amiodarone  about 2 months ago. HR low-mid 50s, also with nocturnal brady on ILR mentioned in June. She denies any recurrent symptoms of afib/flutter and no recurrent episodes on recent loop interrogation. Since HR in the low 50s today, have asked her to remain off amiodarone  and f/u with EP. In June 2024 Dr. Fernande had recommended 6 month follow-up but I do not see this occurred. Will arrange. Will also get TSH with labs. Eliquis  5mg  BID dose remains appropriate but await updated Cr given weight <60kg.  5. CKD 3a - rechecking labs today.   Disposition: F/u with me in 6 months, as well as EP f/u due as above. Have also encouraged ongoing f/u with PCP to discuss RLQ abdominal pain. I recommended she consider holding Ozempic until she can discuss further with them.  Signed, Yamel Bale N Scotty Weigelt, PA-C

## 2023-11-21 ENCOUNTER — Encounter: Payer: Self-pay | Admitting: Physician Assistant

## 2023-11-21 ENCOUNTER — Ambulatory Visit: Attending: Cardiovascular Disease | Admitting: Physician Assistant

## 2023-11-21 VITALS — BP 142/80 | HR 53 | Ht 66.0 in | Wt 122.0 lb

## 2023-11-21 DIAGNOSIS — Z87898 Personal history of other specified conditions: Secondary | ICD-10-CM

## 2023-11-21 DIAGNOSIS — I2510 Atherosclerotic heart disease of native coronary artery without angina pectoris: Secondary | ICD-10-CM

## 2023-11-21 DIAGNOSIS — N1831 Chronic kidney disease, stage 3a: Secondary | ICD-10-CM

## 2023-11-21 DIAGNOSIS — I4892 Unspecified atrial flutter: Secondary | ICD-10-CM

## 2023-11-21 DIAGNOSIS — I10 Essential (primary) hypertension: Secondary | ICD-10-CM | POA: Diagnosis not present

## 2023-11-21 DIAGNOSIS — R001 Bradycardia, unspecified: Secondary | ICD-10-CM

## 2023-11-21 DIAGNOSIS — I480 Paroxysmal atrial fibrillation: Secondary | ICD-10-CM

## 2023-11-21 LAB — LIPID PANEL

## 2023-11-21 NOTE — Patient Instructions (Signed)
 Medication Instructions:  Your physician has recommended you make the following change in your medication:   STOP Amiodarone   STOP Amlodipine   *If you need a refill on your cardiac medications before your next appointment, please call your pharmacy*  Lab Work: TODAY:  CMET, LIPID, CBC, & TSH  If you have labs (blood work) drawn today and your tests are completely normal, you will receive your results only by: MyChart Message (if you have MyChart) OR A paper copy in the mail If you have any lab test that is abnormal or we need to change your treatment, we will call you to review the results.  Testing/Procedures: None ordered  Follow-Up: At [facility], you and your health needs are our priority.  As part of our continuing mission to provide you with exceptional heart care, our providers are all part of one team.  This team includes your primary Cardiologist (physician) and Advanced Practice Providers or APPs (Physician Assistants and Nurse Practitioners) who all work together to provide you with the care you need, when you need it.  Your next appointment:   1ST AVAILABLE   6 month(s)   Provider:   You will see one of the following Advanced Practice Providers on your designated Care Team:   Charlies Arthur, PA-C Michael Andy Tillery, PA-C Suzann Riddle, NP Daphne Barrack, NP       Raphael Bring, PA-C          We recommend signing up for the patient portal called MyChart.  Sign up information is provided on this After Visit Summary.  MyChart is used to connect with patients for Virtual Visits (Telemedicine).  Patients are able to view lab/test results, encounter notes, upcoming appointments, etc.  Non-urgent messages can be sent to your provider as well.   To learn more about what you can do with MyChart, go to [email or web address].   Other Instructions  We need to get a better idea of what your blood pressure is running at home. Here are some instructions to  follow: - I would recommend using a blood pressure cuff that goes on your arm. The wrist ones can be inaccurate. If you're purchasing one for the first time, try to select one that also reports your heart rate because this can be helpful information as well. - To check your blood pressure, choose a time at least 3 hours after taking your blood pressure medicines. If you can sample it at different times of the day, that's great - it might give you more information about how your blood pressure fluctuates. Remain seated in a chair for 5 minutes quietly beforehand, then check it.  - Please record a list of those readings and call us /send in MyChart message with them for our review in 1 week.

## 2023-11-22 ENCOUNTER — Ambulatory Visit: Payer: Self-pay | Admitting: Physician Assistant

## 2023-11-22 DIAGNOSIS — E875 Hyperkalemia: Secondary | ICD-10-CM

## 2023-11-22 LAB — COMPREHENSIVE METABOLIC PANEL WITH GFR
ALT: 13 IU/L (ref 0–32)
AST: 20 IU/L (ref 0–40)
Albumin: 4.5 g/dL (ref 3.8–4.8)
Alkaline Phosphatase: 103 IU/L (ref 44–121)
BUN/Creatinine Ratio: 12 (ref 12–28)
BUN: 14 mg/dL (ref 8–27)
Bilirubin Total: 0.4 mg/dL (ref 0.0–1.2)
CO2: 24 mmol/L (ref 20–29)
Calcium: 10.3 mg/dL (ref 8.7–10.3)
Chloride: 99 mmol/L (ref 96–106)
Creatinine, Ser: 1.2 mg/dL — AB (ref 0.57–1.00)
Globulin, Total: 2.3 g/dL (ref 1.5–4.5)
Glucose: 129 mg/dL — AB (ref 70–99)
Potassium: 5.3 mmol/L — AB (ref 3.5–5.2)
Sodium: 139 mmol/L (ref 134–144)
Total Protein: 6.8 g/dL (ref 6.0–8.5)
eGFR: 47 mL/min/1.73 — AB (ref >59)

## 2023-11-22 LAB — CBC
Hematocrit: 48.2 % — ABNORMAL HIGH (ref 34.0–46.6)
Hemoglobin: 15.5 g/dL (ref 11.1–15.9)
MCH: 31.8 pg (ref 26.6–33.0)
MCHC: 32.2 g/dL (ref 31.5–35.7)
MCV: 99 fL — ABNORMAL HIGH (ref 79–97)
Platelets: 409 x10E3/uL (ref 150–450)
RBC: 4.88 x10E6/uL (ref 3.77–5.28)
RDW: 13.1 % (ref 11.7–15.4)
WBC: 11.4 x10E3/uL — ABNORMAL HIGH (ref 3.4–10.8)

## 2023-11-22 LAB — LIPID PANEL
Cholesterol, Total: 288 mg/dL — AB (ref 100–199)
HDL: 69 mg/dL (ref >39)
LDL CALC COMMENT:: 4.2 ratio (ref 0.0–4.4)
LDL Chol Calc (NIH): 190 mg/dL — AB (ref 0–99)
Triglycerides: 160 mg/dL — AB (ref 0–149)
VLDL Cholesterol Cal: 29 mg/dL (ref 5–40)

## 2023-11-22 LAB — TSH: TSH: 4.32 u[IU]/mL (ref 0.450–4.500)

## 2023-11-22 NOTE — Telephone Encounter (Signed)
-   let patient know labs show midly elevated potassium level, may be lab error. Would have her return for repeat potassium lab only (Does not need full BMET) for re-draw. Renal function consistent with known mild chronic kidney disease. - she is noted to have mildly elevated WBC count - since she has been having issues with belly pain, would ask her to please contact her PCP's office to let them know this (she was recently seen in [place] [facility] so I do not have values to compare to).   - Otherwise, her labs do suggest that perhaps she has not been taking her cholesterol medication since her LDL is now quite high compared to before. She was recently out of 2 other medicines, so perhaps atorvastatin  too? If she has been out, would restart/send in and recheck LFTs/lipids in 3 months. If she has been taking, refer to lipid clinic please. Thank you!   Called patient and left message asking to call back in the morning  regarding her lab results- information sent in MyChart as well.

## 2023-11-29 ENCOUNTER — Ambulatory Visit (INDEPENDENT_AMBULATORY_CARE_PROVIDER_SITE_OTHER)

## 2023-11-29 DIAGNOSIS — I480 Paroxysmal atrial fibrillation: Secondary | ICD-10-CM

## 2023-11-29 LAB — CUP PACEART REMOTE DEVICE CHECK
Date Time Interrogation Session: 20250730230931
Implantable Pulse Generator Implant Date: 20220720

## 2023-11-30 ENCOUNTER — Ambulatory Visit: Payer: Self-pay | Admitting: Cardiology

## 2023-12-07 ENCOUNTER — Other Ambulatory Visit (HOSPITAL_BASED_OUTPATIENT_CLINIC_OR_DEPARTMENT_OTHER): Payer: Self-pay | Admitting: Family Medicine

## 2023-12-07 DIAGNOSIS — R1012 Left upper quadrant pain: Secondary | ICD-10-CM

## 2023-12-07 DIAGNOSIS — R1011 Right upper quadrant pain: Secondary | ICD-10-CM

## 2023-12-27 ENCOUNTER — Ambulatory Visit (HOSPITAL_BASED_OUTPATIENT_CLINIC_OR_DEPARTMENT_OTHER)
Admission: RE | Admit: 2023-12-27 | Discharge: 2023-12-27 | Disposition: A | Discharge status: Home / Self Care | Source: Ambulatory Visit | Attending: Family Medicine | Admitting: Family Medicine

## 2023-12-27 DIAGNOSIS — R1011 Right upper quadrant pain: Secondary | ICD-10-CM

## 2023-12-27 DIAGNOSIS — R1012 Left upper quadrant pain: Secondary | ICD-10-CM | POA: Diagnosis not present

## 2023-12-27 MED ORDER — IOHEXOL 300 MG/ML  SOLN
75.0000 mL | Freq: Once | INTRAMUSCULAR | Status: AC | PRN
  Administered 2023-12-27: 75 mL via INTRAVENOUS

## 2023-12-31 ENCOUNTER — Ambulatory Visit (INDEPENDENT_AMBULATORY_CARE_PROVIDER_SITE_OTHER)

## 2023-12-31 DIAGNOSIS — I480 Paroxysmal atrial fibrillation: Secondary | ICD-10-CM | POA: Diagnosis not present

## 2024-01-01 ENCOUNTER — Other Ambulatory Visit: Payer: Self-pay

## 2024-01-02 LAB — CUP PACEART REMOTE DEVICE CHECK
Date Time Interrogation Session: 20250830230655
Implantable Pulse Generator Implant Date: 20220720

## 2024-01-02 MED ORDER — EMPAGLIFLOZIN 10 MG PO TABS: 10.0000 mg | ORAL_TABLET | Freq: Every day | ORAL | 3 refills | Status: AC

## 2024-01-04 ENCOUNTER — Ambulatory Visit: Payer: Self-pay | Admitting: Cardiology

## 2024-01-08 ENCOUNTER — Other Ambulatory Visit: Payer: Self-pay | Admitting: Internal Medicine

## 2024-01-08 ENCOUNTER — Other Ambulatory Visit: Payer: Self-pay | Admitting: Cardiology

## 2024-01-08 DIAGNOSIS — I480 Paroxysmal atrial fibrillation: Secondary | ICD-10-CM

## 2024-01-08 NOTE — Progress Notes (Signed)
 Remote Loop Recorder Transmission

## 2024-01-08 NOTE — Telephone Encounter (Signed)
 Eliquis  5mg  refill request received. Patient is 73 years old, weight-55.3kg, Crea-1.20 on 11/21/23, Diagnosis-Afib/flutter, and last seen by Raphael Bring on 11/21/23. Dose is appropriate based on dosing criteria. Will send in refill to requested pharmacy.

## 2024-01-26 ENCOUNTER — Other Ambulatory Visit: Payer: Self-pay | Admitting: Internal Medicine

## 2024-01-29 NOTE — Progress Notes (Signed)
 Remote Loop Recorder Transmission

## 2024-01-30 NOTE — Progress Notes (Signed)
 Remote Loop Recorder Transmission

## 2024-01-31 ENCOUNTER — Ambulatory Visit: Payer: Self-pay | Admitting: Cardiology

## 2024-01-31 ENCOUNTER — Ambulatory Visit

## 2024-01-31 DIAGNOSIS — I480 Paroxysmal atrial fibrillation: Secondary | ICD-10-CM

## 2024-01-31 LAB — CUP PACEART REMOTE DEVICE CHECK
Date Time Interrogation Session: 20251001230841
Implantable Pulse Generator Implant Date: 20220720

## 2024-01-31 NOTE — Progress Notes (Signed)
 Remote Loop Recorder Transmission

## 2024-03-03 ENCOUNTER — Ambulatory Visit

## 2024-03-03 DIAGNOSIS — I480 Paroxysmal atrial fibrillation: Secondary | ICD-10-CM | POA: Diagnosis not present

## 2024-03-03 LAB — CUP PACEART REMOTE DEVICE CHECK
Date Time Interrogation Session: 20251102231057
Implantable Pulse Generator Implant Date: 20220720

## 2024-03-04 ENCOUNTER — Ambulatory Visit: Payer: Self-pay | Admitting: Cardiology

## 2024-03-07 NOTE — Progress Notes (Signed)
 Remote Loop Recorder Transmission

## 2024-04-03 ENCOUNTER — Encounter

## 2024-04-04 ENCOUNTER — Ambulatory Visit: Attending: Cardiology

## 2024-04-04 DIAGNOSIS — I480 Paroxysmal atrial fibrillation: Secondary | ICD-10-CM

## 2024-04-04 LAB — CUP PACEART REMOTE DEVICE CHECK
Date Time Interrogation Session: 20251204232146
Implantable Pulse Generator Implant Date: 20220720

## 2024-04-08 NOTE — Progress Notes (Signed)
 Remote Loop Recorder Transmission

## 2024-04-10 ENCOUNTER — Ambulatory Visit: Payer: Self-pay | Admitting: Cardiology

## 2024-04-26 ENCOUNTER — Other Ambulatory Visit: Payer: Self-pay | Admitting: Internal Medicine

## 2024-05-05 ENCOUNTER — Encounter

## 2024-05-05 ENCOUNTER — Ambulatory Visit: Attending: Cardiology

## 2024-05-05 DIAGNOSIS — I480 Paroxysmal atrial fibrillation: Secondary | ICD-10-CM | POA: Diagnosis not present

## 2024-05-06 ENCOUNTER — Ambulatory Visit: Payer: Self-pay | Admitting: Cardiology

## 2024-05-06 LAB — CUP PACEART REMOTE DEVICE CHECK
Date Time Interrogation Session: 20260104231026
Implantable Pulse Generator Implant Date: 20220720

## 2024-05-09 NOTE — Progress Notes (Signed)
 Remote Loop Recorder Transmission

## 2024-06-05 ENCOUNTER — Encounter

## 2024-06-05 ENCOUNTER — Ambulatory Visit

## 2024-06-05 LAB — CUP PACEART REMOTE DEVICE CHECK
Date Time Interrogation Session: 20260204230724
Implantable Pulse Generator Implant Date: 20220720

## 2024-07-06 ENCOUNTER — Ambulatory Visit

## 2024-07-07 ENCOUNTER — Encounter

## 2024-08-06 ENCOUNTER — Ambulatory Visit

## 2024-08-07 ENCOUNTER — Encounter

## 2024-09-06 ENCOUNTER — Ambulatory Visit

## 2024-09-08 ENCOUNTER — Encounter

## 2024-10-07 ENCOUNTER — Ambulatory Visit

## 2024-10-09 ENCOUNTER — Encounter

## 2024-11-07 ENCOUNTER — Ambulatory Visit

## 2024-11-10 ENCOUNTER — Encounter

## 2024-12-08 ENCOUNTER — Ambulatory Visit

## 2024-12-11 ENCOUNTER — Encounter

## 2025-01-08 ENCOUNTER — Ambulatory Visit

## 2025-01-12 ENCOUNTER — Encounter

## 2025-02-08 ENCOUNTER — Ambulatory Visit

## 2025-03-11 ENCOUNTER — Ambulatory Visit

## 2025-04-11 ENCOUNTER — Ambulatory Visit

## 2025-05-12 ENCOUNTER — Ambulatory Visit
